# Patient Record
Sex: Male | Born: 1945 | Race: White | Hispanic: No | Marital: Married | Smoking: Former smoker
Health system: Southern US, Community
[De-identification: ages and names within clinical notes are randomized; demographics above are authoritative.]

## PROBLEM LIST (undated history)

## (undated) DIAGNOSIS — D539 Nutritional anemia, unspecified: Secondary | ICD-10-CM

## (undated) DIAGNOSIS — J690 Pneumonitis due to inhalation of food and vomit: Secondary | ICD-10-CM

## (undated) DIAGNOSIS — N289 Disorder of kidney and ureter, unspecified: Secondary | ICD-10-CM

## (undated) DIAGNOSIS — J9621 Acute and chronic respiratory failure with hypoxia: Secondary | ICD-10-CM

## (undated) DIAGNOSIS — S065X0S Traumatic subdural hemorrhage without loss of consciousness, sequela: Secondary | ICD-10-CM

## (undated) DIAGNOSIS — K219 Gastro-esophageal reflux disease without esophagitis: Secondary | ICD-10-CM

## (undated) DIAGNOSIS — I4891 Unspecified atrial fibrillation: Secondary | ICD-10-CM

## (undated) DIAGNOSIS — D649 Anemia, unspecified: Secondary | ICD-10-CM

## (undated) DIAGNOSIS — F0390 Unspecified dementia without behavioral disturbance: Secondary | ICD-10-CM

## (undated) DIAGNOSIS — J9690 Respiratory failure, unspecified, unspecified whether with hypoxia or hypercapnia: Secondary | ICD-10-CM

## (undated) DIAGNOSIS — I639 Cerebral infarction, unspecified: Secondary | ICD-10-CM

## (undated) DIAGNOSIS — F4310 Post-traumatic stress disorder, unspecified: Secondary | ICD-10-CM

## (undated) DIAGNOSIS — Z951 Presence of aortocoronary bypass graft: Secondary | ICD-10-CM

## (undated) DIAGNOSIS — N179 Acute kidney failure, unspecified: Secondary | ICD-10-CM

## (undated) DIAGNOSIS — I69354 Hemiplegia and hemiparesis following cerebral infarction affecting left non-dominant side: Secondary | ICD-10-CM

## (undated) DIAGNOSIS — F411 Generalized anxiety disorder: Secondary | ICD-10-CM

## (undated) DIAGNOSIS — I69398 Other sequelae of cerebral infarction: Secondary | ICD-10-CM

## (undated) DIAGNOSIS — G4700 Insomnia, unspecified: Secondary | ICD-10-CM

## (undated) DIAGNOSIS — H42 Glaucoma in diseases classified elsewhere: Secondary | ICD-10-CM

## (undated) DIAGNOSIS — H409 Unspecified glaucoma: Secondary | ICD-10-CM

## (undated) DIAGNOSIS — G5680 Other specified mononeuropathies of unspecified upper limb: Secondary | ICD-10-CM

## (undated) DIAGNOSIS — I2510 Atherosclerotic heart disease of native coronary artery without angina pectoris: Secondary | ICD-10-CM

## (undated) DIAGNOSIS — S98139A Complete traumatic amputation of one unspecified lesser toe, initial encounter: Secondary | ICD-10-CM

## (undated) DIAGNOSIS — N183 Chronic kidney disease, stage 3 unspecified: Secondary | ICD-10-CM

## (undated) DIAGNOSIS — G894 Chronic pain syndrome: Secondary | ICD-10-CM

## (undated) DIAGNOSIS — I10 Essential (primary) hypertension: Secondary | ICD-10-CM

## (undated) DIAGNOSIS — L89620 Pressure ulcer of left heel, unstageable: Secondary | ICD-10-CM

## (undated) DIAGNOSIS — Z9911 Dependence on respirator [ventilator] status: Secondary | ICD-10-CM

## (undated) DIAGNOSIS — Z89439 Acquired absence of unspecified foot: Secondary | ICD-10-CM

## (undated) DIAGNOSIS — I739 Peripheral vascular disease, unspecified: Secondary | ICD-10-CM

## (undated) DIAGNOSIS — R1312 Dysphagia, oropharyngeal phase: Secondary | ICD-10-CM

## (undated) DIAGNOSIS — G9340 Encephalopathy, unspecified: Secondary | ICD-10-CM

## (undated) DIAGNOSIS — E875 Hyperkalemia: Secondary | ICD-10-CM

## (undated) DIAGNOSIS — H9190 Unspecified hearing loss, unspecified ear: Secondary | ICD-10-CM

## (undated) DIAGNOSIS — E119 Type 2 diabetes mellitus without complications: Secondary | ICD-10-CM

## (undated) DIAGNOSIS — L89159 Pressure ulcer of sacral region, unspecified stage: Secondary | ICD-10-CM

## (undated) DIAGNOSIS — H543 Unqualified visual loss, both eyes: Secondary | ICD-10-CM

## (undated) DIAGNOSIS — E079 Disorder of thyroid, unspecified: Secondary | ICD-10-CM

## (undated) DIAGNOSIS — M6281 Muscle weakness (generalized): Secondary | ICD-10-CM

## (undated) DIAGNOSIS — S301XXA Contusion of abdominal wall, initial encounter: Secondary | ICD-10-CM

## (undated) DIAGNOSIS — D34 Benign neoplasm of thyroid gland: Secondary | ICD-10-CM

## (undated) DIAGNOSIS — R29898 Other symptoms and signs involving the musculoskeletal system: Secondary | ICD-10-CM

## (undated) HISTORY — PX: CORONARY ARTERY BYPASS GRAFT: SHX141

---

## 2005-11-18 DIAGNOSIS — S1201XA Stable burst fracture of first cervical vertebra, initial encounter for closed fracture: Secondary | ICD-10-CM

## 2005-11-18 HISTORY — DX: Stable burst fracture of first cervical vertebra, initial encounter for closed fracture: S12.01XA

## 2019-04-30 ENCOUNTER — Other Ambulatory Visit: Payer: Self-pay

## 2019-04-30 ENCOUNTER — Emergency Department: Payer: Medicare Other

## 2019-04-30 ENCOUNTER — Encounter: Payer: Self-pay | Admitting: Emergency Medicine

## 2019-04-30 ENCOUNTER — Emergency Department
Admission: EM | Admit: 2019-04-30 | Discharge: 2019-04-30 | Disposition: A | Discharge status: Other Acute Inpatient Hospital | Payer: Medicare Other | Attending: Student in an Organized Health Care Education/Training Program | Admitting: Student in an Organized Health Care Education/Training Program

## 2019-04-30 DIAGNOSIS — Z79899 Other long term (current) drug therapy: Secondary | ICD-10-CM | POA: Diagnosis not present

## 2019-04-30 DIAGNOSIS — Z7982 Long term (current) use of aspirin: Secondary | ICD-10-CM | POA: Diagnosis not present

## 2019-04-30 DIAGNOSIS — Z20822 Contact with and (suspected) exposure to covid-19: Secondary | ICD-10-CM | POA: Insufficient documentation

## 2019-04-30 DIAGNOSIS — Z794 Long term (current) use of insulin: Secondary | ICD-10-CM | POA: Insufficient documentation

## 2019-04-30 DIAGNOSIS — N3090 Cystitis, unspecified without hematuria: Secondary | ICD-10-CM | POA: Diagnosis not present

## 2019-04-30 DIAGNOSIS — R4182 Altered mental status, unspecified: Secondary | ICD-10-CM | POA: Diagnosis present

## 2019-04-30 DIAGNOSIS — Z8673 Personal history of transient ischemic attack (TIA), and cerebral infarction without residual deficits: Secondary | ICD-10-CM | POA: Diagnosis not present

## 2019-04-30 HISTORY — DX: Cerebral infarction, unspecified: I63.9

## 2019-04-30 LAB — CBC WITH DIFFERENTIAL/PLATELET
Abs Immature Granulocytes: 0.02 10*3/uL (ref 0.00–0.07)
Basophils Absolute: 0.1 10*3/uL (ref 0.0–0.1)
Basophils Relative: 1 %
Eosinophils Absolute: 0.3 10*3/uL (ref 0.0–0.5)
Eosinophils Relative: 3 %
HCT: 34.7 % — ABNORMAL LOW (ref 39.0–52.0)
Hemoglobin: 10.8 g/dL — ABNORMAL LOW (ref 13.0–17.0)
Immature Granulocytes: 0 %
Lymphocytes Relative: 9 %
Lymphs Abs: 0.9 10*3/uL (ref 0.7–4.0)
MCH: 31 pg (ref 26.0–34.0)
MCHC: 31.1 g/dL (ref 30.0–36.0)
MCV: 99.7 fL (ref 80.0–100.0)
Monocytes Absolute: 0.7 10*3/uL (ref 0.1–1.0)
Monocytes Relative: 6 %
Neutro Abs: 8.3 10*3/uL — ABNORMAL HIGH (ref 1.7–7.7)
Neutrophils Relative %: 81 %
Platelets: 193 10*3/uL (ref 150–400)
RBC: 3.48 MIL/uL — ABNORMAL LOW (ref 4.22–5.81)
RDW: 15.5 % (ref 11.5–15.5)
WBC: 10.3 10*3/uL (ref 4.0–10.5)
nRBC: 0 % (ref 0.0–0.2)

## 2019-04-30 LAB — URINALYSIS, COMPLETE (UACMP) WITH MICROSCOPIC
Bacteria, UA: NONE SEEN
Bilirubin Urine: NEGATIVE
Glucose, UA: NEGATIVE mg/dL
Hgb urine dipstick: NEGATIVE
Ketones, ur: NEGATIVE mg/dL
Nitrite: NEGATIVE
Protein, ur: NEGATIVE mg/dL
Specific Gravity, Urine: 1.015 (ref 1.005–1.030)
WBC, UA: >50 WBC/hpf — ABNORMAL HIGH (ref 0–5)
pH: 5 (ref 5.0–8.0)

## 2019-04-30 LAB — COMPREHENSIVE METABOLIC PANEL
ALT: 13 U/L (ref 0–44)
AST: 15 U/L (ref 15–41)
Albumin: 3.3 g/dL — ABNORMAL LOW (ref 3.5–5.0)
Alkaline Phosphatase: 73 U/L (ref 38–126)
Anion gap: 8 (ref 5–15)
BUN: 20 mg/dL (ref 8–23)
CO2: 20 mmol/L — ABNORMAL LOW (ref 22–32)
Calcium: 8.7 mg/dL — ABNORMAL LOW (ref 8.9–10.3)
Chloride: 114 mmol/L — ABNORMAL HIGH (ref 98–111)
Creatinine, Ser: 1.02 mg/dL (ref 0.61–1.24)
GFR calc Af Amer: >60 mL/min (ref >60)
GFR calc non Af Amer: >60 mL/min (ref >60)
Glucose, Bld: 92 mg/dL (ref 70–99)
Potassium: 3.7 mmol/L (ref 3.5–5.1)
Sodium: 142 mmol/L (ref 135–145)
Total Bilirubin: 0.7 mg/dL (ref 0.3–1.2)
Total Protein: 6.3 g/dL — ABNORMAL LOW (ref 6.5–8.1)

## 2019-04-30 LAB — POC SARS CORONAVIRUS 2 AG -  ED: SARS Coronavirus 2 Ag: NEGATIVE

## 2019-04-30 MED ORDER — CEPHALEXIN 500 MG PO CAPS: 500.0000 mg | ORAL_CAPSULE | Freq: Three times a day (TID) | ORAL | 0 refills | Status: AC

## 2019-04-30 MED ORDER — SODIUM CHLORIDE 0.9 % IV SOLN
1.0000 g | Freq: Once | INTRAVENOUS | Status: AC
  Administered 2019-04-30: 13:00:00 1 g via INTRAVENOUS
  Filled 2019-04-30: qty 10

## 2019-04-30 NOTE — ED Triage Notes (Signed)
Pt presents from [facility] with c/o altered mental status. According to facility, at baseline pt can feed self, facility reported pt was not able to feed self this am. Pt c/o bilateral arm weakness. Pt has hx of CVA.  stroke screen negative for ems. Pt alert and oriented x3. CBG 132.

## 2019-04-30 NOTE — Discharge Instructions (Signed)
Please take antibiotics as prescribed.  Return for any additional questions or concerns.

## 2019-04-30 NOTE — ED Notes (Signed)
Patient provided with urinal per request and wife was assisted with finding restroom. Patient denies needing assistance

## 2019-04-30 NOTE — ED Provider Notes (Signed)
[facility] Emergency Department Provider Note    First MD Initiated Contact with Patient 04/30/19 1036     (approximate)  I have reviewed the triage vital signs and the nursing notes.   HISTORY  Chief Complaint Altered Mental Status    HPI Albert Vance is a 74 y.o. male presents the ER for evaluation of altered mental status including confusion as well as generalized weakness reportedly having difficulty with raising both arms today and having trouble eating.  Patient with extensive stroke history.  Last seen normal was last night.  Patient states he does feel weak.  Fairly confused and difficult to get a good history from the patient himself.  Facility reports that he is typically fairly independent.  No report of any recent fevers.  Patient did have recent left leg surgery.  I do not see any documentation as to where this previously occurred.    Past Medical History:  Diagnosis Date  . Stroke [facility])    History reviewed. No pertinent family history. History reviewed. No pertinent surgical history. There are no problems to display for this patient.     Prior to Admission medications   Medication Sig Start Date End Date Taking? Authorizing Provider  acetaminophen (TYLENOL) 500 MG tablet Take 500 mg by mouth every 8 (eight) hours.   Yes [provider]  acetaZOLAMIDE (DIAMOX) 250 MG tablet Take 250 mg by mouth 2 (two) times daily.   Yes [provider]  amLODipine (NORVASC) 10 MG tablet Take 10 mg by mouth daily.   Yes [provider]  aspirin EC 81 MG tablet Take 81 mg by mouth daily.   Yes [provider]  atorvastatin (LIPITOR) 40 MG tablet Take 40 mg by mouth at bedtime.   Yes [provider]  baclofen (LIORESAL) 10 MG tablet Take 5 mg by mouth at bedtime.   Yes [provider]  bisacodyl (DULCOLAX) 10 MG suppository Place 10 mg rectally as needed for moderate constipation.   Yes [provider]  Brinzolamide-Brimonidine (SIMBRINZA) 1-0.2 % SUSP Place 1 drop into both eyes 3 (three) times daily.   Yes [provider]  carboxymethylcellulose 1 % ophthalmic solution Place 2 drops into the left eye 4 (four) times daily.   Yes [provider]  cholecalciferol (VITAMIN D3) 25 MCG (1000 UNIT) tablet Take 1,000 Units by mouth daily.   Yes [provider]  dextrose (GLUTOSE 15) 40 % GEL Take 1 Tube by mouth once as needed for low blood sugar.   Yes [provider]  dorzolamide-timolol (COSOPT) 22.3-6.8 MG/ML ophthalmic solution Place 1 drop into both eyes 2 (two) times daily.   Yes [provider]  insulin aspart (NOVOLOG) 100 UNIT/ML injection Inject 0-12 Units into the skin 4 (four) times daily -  before meals and at bedtime. Inject under the skin according to sliding scale:  <200: 0u 200-249: 2u 250-299: 4u 300-349: 6u 350-399: 8u 400-449: 10u 450-499: 12u >500: contact physician   Yes [provider]  insulin glargine (LANTUS) 100 UNIT/ML injection Inject 20 Units into the skin at bedtime.   Yes [provider]  latanoprost (XALATAN) 0.005 % ophthalmic solution Place 1 drop into both eyes at bedtime.   Yes [provider]  losartan (COZAAR) 100 MG tablet Take 100 mg by mouth daily.   Yes [provider]  Melatonin 3 MG TABS Take 6 mg by mouth at bedtime.   Yes [provider]  Netarsudil  Dimesylate (RHOPRESSA) 0.02 % SOLN Place 1 drop into both eyes every evening.   Yes [provider]  ondansetron (ZOFRAN) 4 MG tablet Take 4 mg by mouth every 4 (four) hours as needed for nausea or vomiting.   Yes [provider]  oxyCODONE (OXY IR/ROXICODONE) 5 MG immediate release tablet Take 5 mg by mouth every 8 (eight) hours.   Yes [provider]  oxyCODONE (OXY IR/ROXICODONE) 5 MG immediate release tablet Take 5 mg by mouth every 4 (four) hours as needed for severe  pain.   Yes [provider]  rivaroxaban (XARELTO) 20 MG TABS tablet Take 20 mg by mouth daily with supper.   Yes [provider]  sennosides-docusate sodium (SENOKOT-S) 8.6-50 MG tablet Take 2 tablets by mouth 2 (two) times daily with a meal.   Yes [provider]  tamsulosin (FLOMAX) 0.4 MG CAPS capsule Take 0.4 mg by mouth at bedtime.   Yes [provider]  cephALEXin (KEFLEX) 500 MG capsule Take 1 capsule (500 mg total) by mouth 3 (three) times daily for 7 days. 04/30/19 05/07/19  Merlyn Lot, MD    Allergies Codeine, Gabapentin, Lisinopril, and Metformin and related    Social History Social History   Tobacco Use  . Smoking status: Unknown If Ever Smoked  Substance Use Topics  . Alcohol use: Not on file  . Drug use: Not on file    Review of Systems Patient denies headaches, rhinorrhea, blurry vision, numbness, shortness of breath, chest pain, edema, cough, abdominal pain, nausea, vomiting, diarrhea, dysuria, fevers, rashes or hallucinations unless otherwise stated above in HPI. ____________________________________________   PHYSICAL EXAM:  VITAL SIGNS: Vitals:   04/30/19 1130 04/30/19 1300  BP: (!) 156/53 (!) 135/98  Pulse: (!) 37   Resp: 18 19  Temp:    SpO2: 100% (!) 85%    Constitutional: Alert but disoriented x2.  Eyes: Conjunctivae are normal.  Head: Atraumatic. Nose: No congestion/rhinnorhea. Mouth/Throat: Mucous membranes are moist.   Neck: No stridor. Painless ROM.  Cardiovascular: irregular rhythm. Grossly normal heart sounds.  Good peripheral circulation. Respiratory: Normal respiratory effort.  No retractions. Lungs CTAB. Gastrointestinal: Soft and nontender. No distention. No abdominal bruits. No CVA tenderness. Genitourinary:  Musculoskeletal: BLE edema,  lle surgical wound appears appropriate healing.  No joint effusions. Neurologic:  BUE weakness, bilateral dysmetria, no facial droop. Can't hold legs up  against gravity.  SILT Skin:  Skin is warm, dry and intact. No rash noted. Psychiatric: calm and cooperative. ____________________________________________   LABS (all labs ordered are listed, but only abnormal results are displayed)  Results for orders placed or performed during the hospital encounter of 04/30/19 (from the past 24 hour(s))  Urinalysis, Complete w Microscopic     Status: Abnormal   Collection Time: 04/30/19 10:43 AM  Result Value Ref Range   Color, Urine YELLOW (A) YELLOW   APPearance HAZY (A) CLEAR   Specific Gravity, Urine 1.015 1.005 - 1.030   pH 5.0 5.0 - 8.0   Glucose, UA NEGATIVE NEGATIVE mg/dL   Hgb urine dipstick NEGATIVE NEGATIVE   Bilirubin Urine NEGATIVE NEGATIVE   Ketones, ur NEGATIVE NEGATIVE mg/dL   Protein, ur NEGATIVE NEGATIVE mg/dL   Nitrite NEGATIVE NEGATIVE   Leukocytes,Ua MODERATE (A) NEGATIVE   RBC / HPF 0-5 0 - 5 RBC/hpf   WBC, UA >50 (H) 0 - 5 WBC/hpf   Bacteria, UA NONE SEEN NONE SEEN   Squamous Epithelial / LPF 0-5 0 - 5   WBC  Clumps PRESENT   CBC with Differential/Platelet     Status: Abnormal   Collection Time: 04/30/19 10:47 AM  Result Value Ref Range   WBC 10.3 4.0 - 10.5 K/uL   RBC 3.48 (L) 4.22 - 5.81 MIL/uL   Hemoglobin 10.8 (L) 13.0 - 17.0 g/dL   HCT 34.7 (L) 39.0 - 52.0 %   MCV 99.7 80.0 - 100.0 fL   MCH 31.0 26.0 - 34.0 pg   MCHC 31.1 30.0 - 36.0 g/dL   RDW 15.5 11.5 - 15.5 %   Platelets 193 150 - 400 K/uL   nRBC 0.0 0.0 - 0.2 %   Neutrophils Relative % 81 %   Neutro Abs 8.3 (H) 1.7 - 7.7 K/uL   Lymphocytes Relative 9 %   Lymphs Abs 0.9 0.7 - 4.0 K/uL   Monocytes Relative 6 %   Monocytes Absolute 0.7 0.1 - 1.0 K/uL   Eosinophils Relative 3 %   Eosinophils Absolute 0.3 0.0 - 0.5 K/uL   Basophils Relative 1 %   Basophils Absolute 0.1 0.0 - 0.1 K/uL   Immature Granulocytes 0 %   Abs Immature Granulocytes 0.02 0.00 - 0.07 K/uL  Comprehensive metabolic panel     Status: Abnormal   Collection Time: 04/30/19 10:47 AM   Result Value Ref Range   Sodium 142 135 - 145 mmol/L   Potassium 3.7 3.5 - 5.1 mmol/L   Chloride 114 (H) 98 - 111 mmol/L   CO2 20 (L) 22 - 32 mmol/L   Glucose, Bld 92 70 - 99 mg/dL   BUN 20 8 - 23 mg/dL   Creatinine, Ser 1.02 0.61 - 1.24 mg/dL   Calcium 8.7 (L) 8.9 - 10.3 mg/dL   Total Protein 6.3 (L) 6.5 - 8.1 g/dL   Albumin 3.3 (L) 3.5 - 5.0 g/dL   AST 15 15 - 41 U/L   ALT 13 0 - 44 U/L   Alkaline Phosphatase 73 38 - 126 U/L   Total Bilirubin 0.7 0.3 - 1.2 mg/dL   GFR calc non Af Amer >60 >60 mL/min   GFR calc Af Amer >60 >60 mL/min   Anion gap 8 5 - 15  POC SARS Coronavirus 2 Ag-ED - Nasal Swab (BD Veritor Kit)     Status: None   Collection Time: 04/30/19  1:41 PM  Result Value Ref Range   SARS Coronavirus 2 Ag Negative Negative   ____________________________________________  EKG My review and personal interpretation at Time:   10:41 Indication: weakness  Rate: 50  Rhythm: afib Axis: normal Other: poor w wave progression, no stemi ____________________________________________  RADIOLOGY  I personally reviewed all radiographic images ordered to evaluate for the above acute complaints and reviewed radiology reports and findings.  These findings were personally discussed with the patient.  Please see medical record for radiology report.  ____________________________________________   PROCEDURES  Procedure(s) performed:  Procedures    Critical Care performed: no ____________________________________________   INITIAL IMPRESSION / ASSESSMENT AND PLAN / ED COURSE  Pertinent labs & imaging results that were available during my care of the patient were reviewed by me and considered in my medical decision making (see chart for details).   DDX: Dehydration, sepsis, pna, uti, hypoglycemia, cva, drug effect, withdrawal   Albert Vance is a 74 y.o. who presents to the ED with symptoms as described above.  Patient chronically ill-appearing with the above complaints.   Fairly broad differential at this point.  Will order imaging as well as blood work.  The patient will be placed on continuous pulse oximetry and telemetry for monitoring.  Laboratory evaluation will be sent to evaluate for the above complaints.     Clinical Course as of Apr 29 1545  Fri Apr 30, 2019  1236 Blood work without significant leukocytosis.  Remains afebrile.  Urine does appear dirty will give dose of Rocephin order culture.  Given his altered mental status he is outside of the window for code stroke border order MRI given his extensive history of CVA in the past.  I feel like he needs to be admitted to the hospital.  Was recently at the [place] what appears to be probable vascular surgery of the left lower extremity with appropriately healing wound.  I cannot find documentation of what procedure the course of that recent hospitalization was.  Will reach out to the [place] for admission.   [PR]  1442 Patient was declined by the [place]. He clinically has improved in the ER.  MRI imaging is reassuring.  Patient is tolerating PO.  Seems to be at baseline.  Doubt TIA.  No signs of sepsis.   [PR]    Clinical Course User Index [PR] Merlyn Lot, MD    The patient was evaluated in Emergency Department today for the symptoms described in the history of present illness. He/she was evaluated in the context of the global COVID-19 pandemic, which necessitated consideration that the patient might be at risk for infection with the SARS-CoV-2 virus that causes COVID-19. Institutional protocols and algorithms that pertain to the evaluation of patients at risk for COVID-19 are in a state of rapid change based on information released by regulatory bodies including the CDC and federal and state organizations. These policies and algorithms were followed during the patient's care in the ED.  As part of my medical decision making, I reviewed the following data within the [facility] notes reviewed  and incorporated, Labs reviewed, notes from prior ED visits and [place] Controlled Substance Database   ____________________________________________   FINAL CLINICAL IMPRESSION(S) / ED DIAGNOSES  Final diagnoses:  Altered mental status, unspecified altered mental status type  Cystitis      NEW MEDICATIONS STARTED DURING THIS VISIT:  New Prescriptions   CEPHALEXIN (KEFLEX) 500 MG CAPSULE    Take 1 capsule (500 mg total) by mouth 3 (three) times daily for 7 days.     Note:  This document was prepared using Dragon voice recognition software and may include unintentional dictation errors.    Merlyn Lot, MD 04/30/19 1547

## 2019-04-30 NOTE — ED Notes (Signed)
Pt given graham crackers and water to drink.  

## 2019-05-01 LAB — URINE CULTURE: Culture: 20000 — AB

## 2020-12-19 IMAGING — MR MR HEAD W/O CM
12 of 15 series · 38 of 48 positions shown · non-contrast
Comparison: None.

CLINICAL DATA: Altered mental status

EXAM:
MRI HEAD WITHOUT CONTRAST
MRI CERVICAL SPINE WITHOUT CONTRAST
TECHNIQUE: Multiplanar, multiecho pulse sequences of the brain and surrounding
structures, and cervical spine, to include the craniocervical
junction and cervicothoracic junction, were obtained without
intravenous contrast.

[Series 5: ax dwi_tracew · axial · 3.0mm · 0.60mm/px · z∈[-128,+25]mm · 5 of 48 slices shown]
[im 1/48]
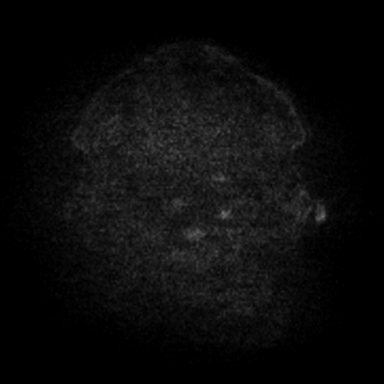
[im 12/48]
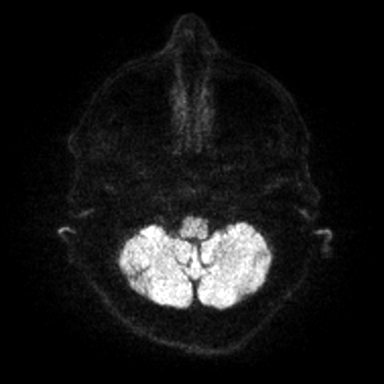
[im 24/48]
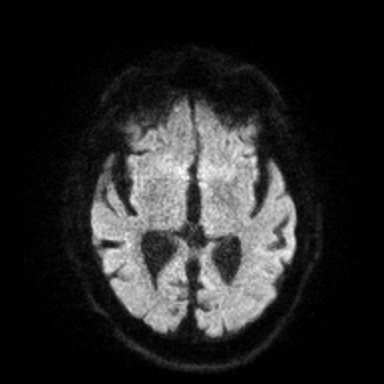
[im 36/48]
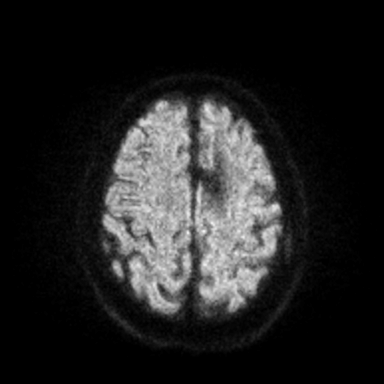
[im 48/48]
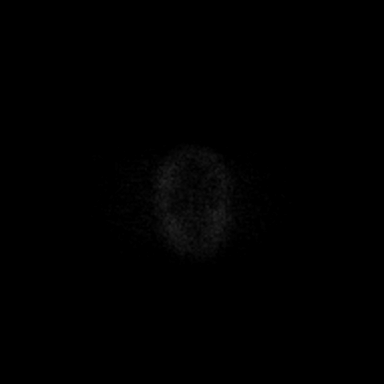

[Series 6: ax dwi_adc · axial · 3.0mm · 0.60mm/px · z∈[-128,+25]mm · 5 of 48 slices shown]
[im 1/48]
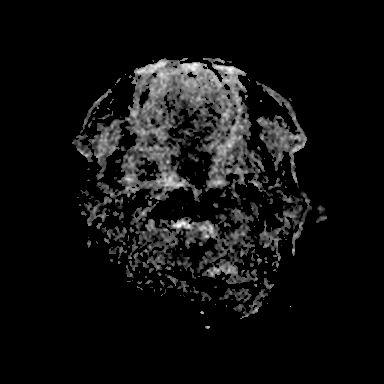
[im 12/48]
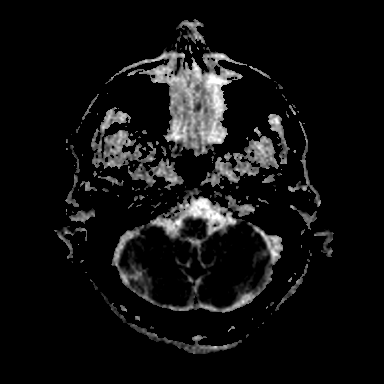
[im 24/48]
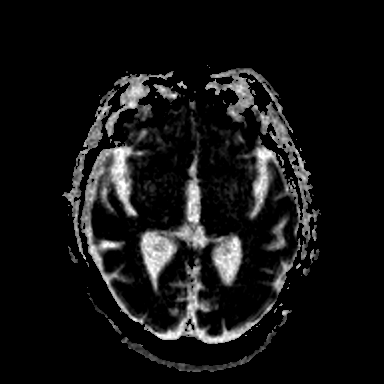
[im 36/48]
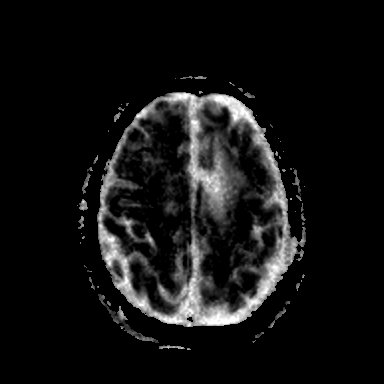
[im 48/48]
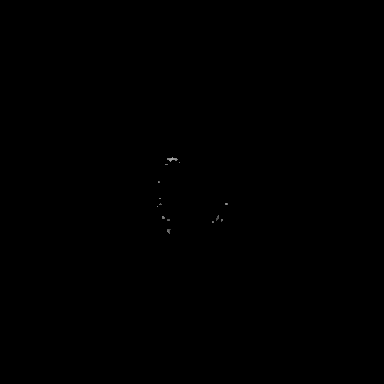

[Series 7: cor dwi_tracew · coronal · 5.0mm · 0.60mm/px · 4 of 38 slices shown]
[im 1/38]
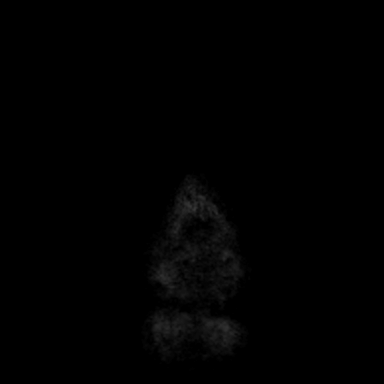
[im 13/38]
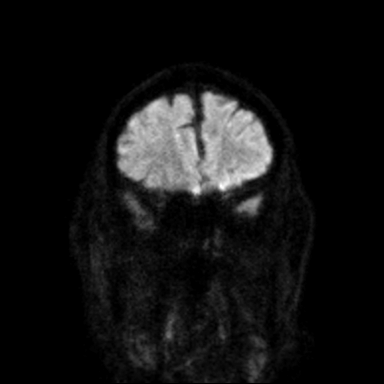
[im 25/38]
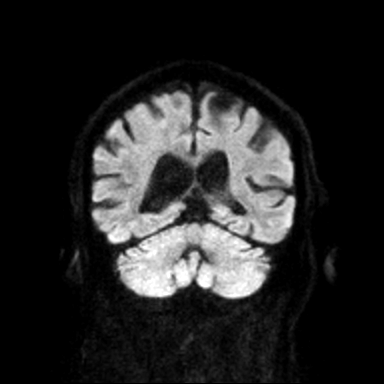
[im 38/38]
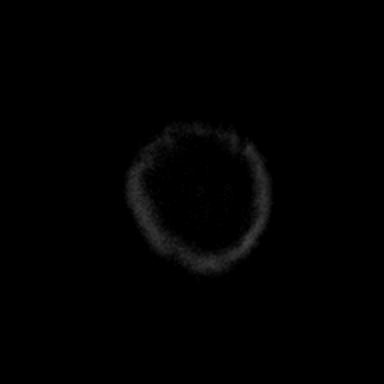

[Series 9: T1 · sagittal · 5.0mm · 0.94mm/px · 3 of 25 slices shown (1 of 2)]
[im 1/25]
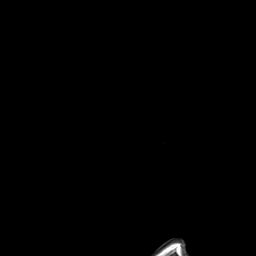
[im 13/25]
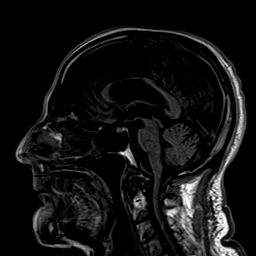
[im 25/25]
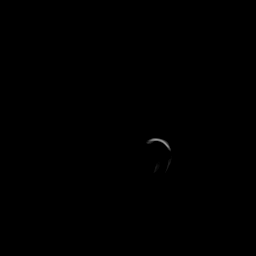

[Series 10: T2 · axial · 5.0mm · 0.45mm/px · z∈[-125,+30]mm · 3 of 27 slices shown (1 of 4)]
[im 1/27]
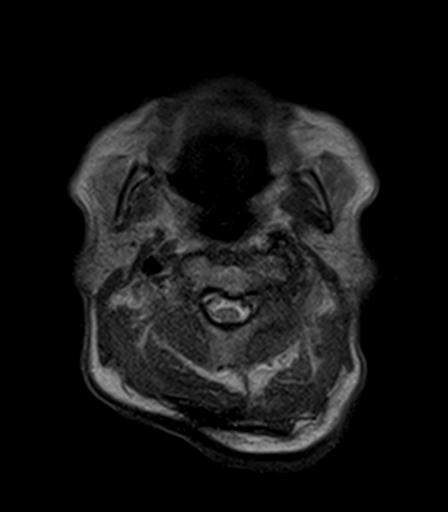
[im 14/27]
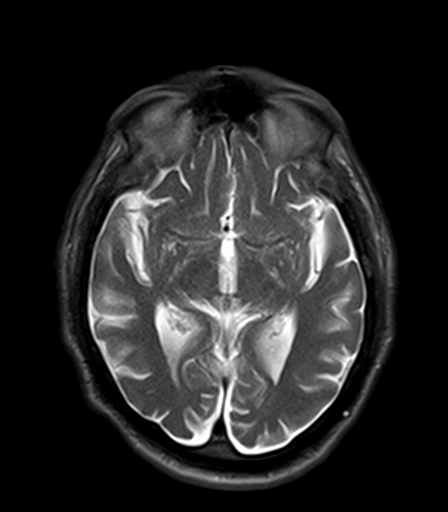
[im 27/27]
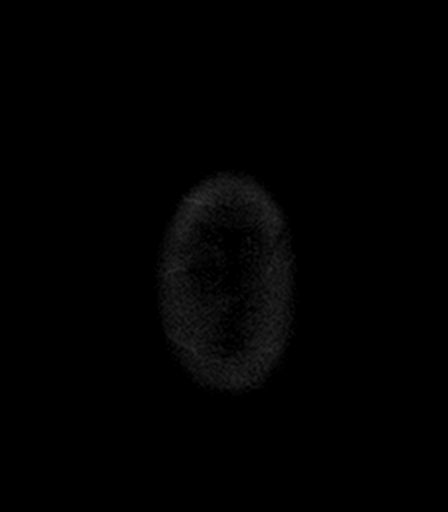

[Series 12: FLAIR · axial · 5.0mm · 1.20mm/px · z∈[-125,+29]mm · 3 of 27 slices shown (1 of 2)]
[im 1/27]
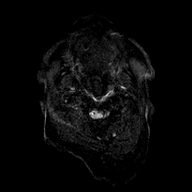
[im 14/27]
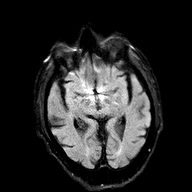
[im 27/27]
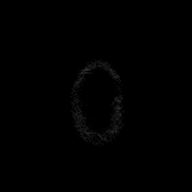

[Series 13: T1 · axial · 5.0mm · 0.90mm/px · z∈[-125,+30]mm · 3 of 27 slices shown (2 of 2)]
[im 1/27]
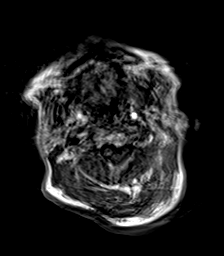
[im 14/27]
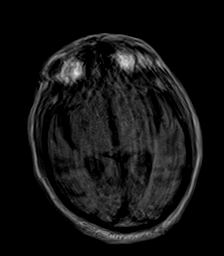
[im 27/27]
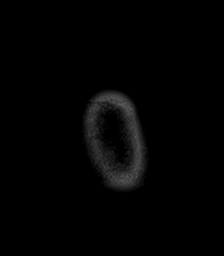

[Series 14: T2 · coronal · 5.0mm · 0.45mm/px · 3 of 31 slices shown (2 of 4)]
[im 1/31]
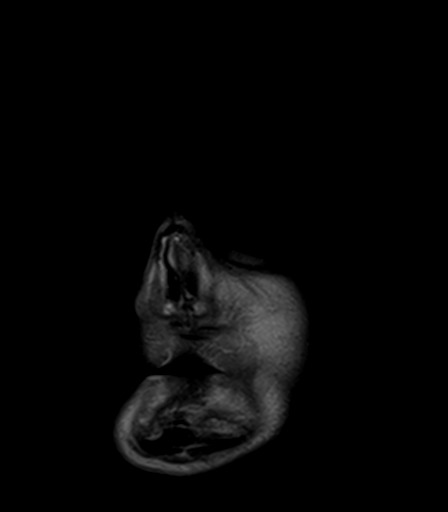
[im 16/31]
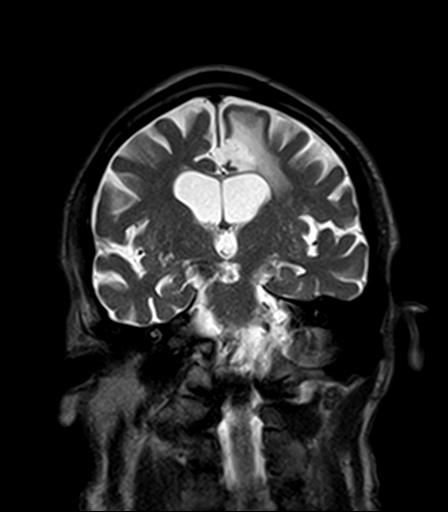
[im 31/31]
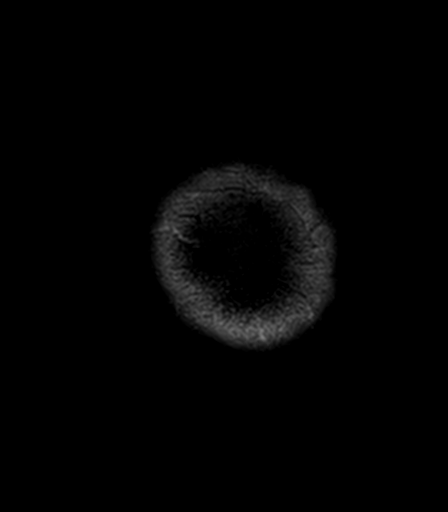

[Series 19: T2 · sagittal · 3.0mm · 0.62mm/px · 2 of 15 slices shown (3 of 4)]
[im 1/15]
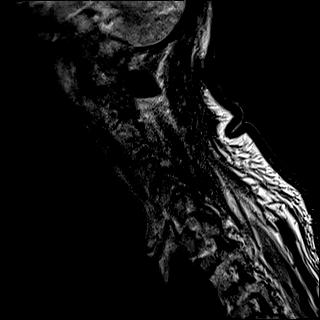
[im 15/15]
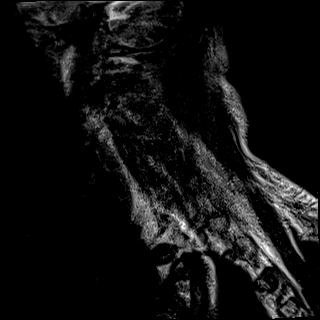

[Series 20: FLAIR · sagittal · 3.0mm · 0.78mm/px · 2 of 15 slices shown (2 of 2)]
[im 1/15]
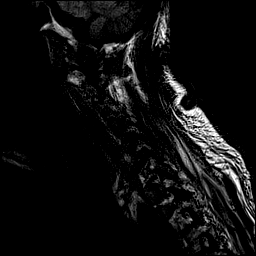
[im 15/15]
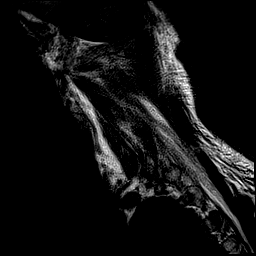

[Series 21: STIR · sagittal · 3.0mm · 0.62mm/px · 2 of 15 slices shown]
[im 1/15]
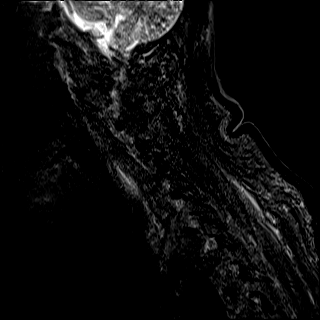
[im 15/15]
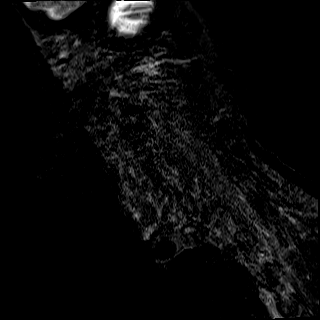

[Series 22: T2 · axial · 3.0mm · 0.70mm/px · z∈[-239,-143]mm · 3 of 31 slices shown (4 of 4)]
[im 1/31]
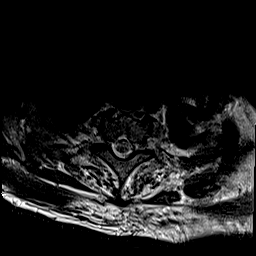
[im 16/31]
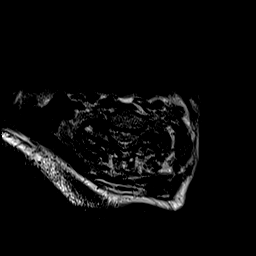
[im 31/31]
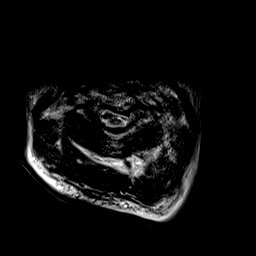

[38 of 48 positions shown; findings below may reference images not displayed]

FINDINGS: MRI HEAD

Brain: There is no acute infarction or intracranial hemorrhage.

There is a chronic infarct of the parasagittal left frontal lobe
involving the ACA territory with some extension into the corpus
callosum. There are chronic right cerebellar infarcts with
associated chronic blood products. Additional patchy T2
hyperintensity in the supratentorial white matter is nonspecific but
may reflect mild chronic microvascular ischemic changes.

There is no intracranial mass, mass effect, or edema. There is no
hydrocephalus or extra-axial fluid collection.

Vascular: Major vessel flow voids at the skull base are preserved.

Skull and upper cervical spine: Normal marrow signal is preserved.

Sinuses/Orbits: Minor mucosal thickening. Bilateral lens
replacements.

Other: Sella is unremarkable. Mastoid air cells are clear.

MRI CERVICAL SPINE FINDINGS

Significantly motion degraded.

Alignment: No significant listhesis.

Vertebrae: Vertebral body heights appear maintained no definite
marrow edema.

Cord: No definite abnormal signal within the above limitation.

Posterior Fossa, vertebral arteries, paraspinal tissues: Mild
prevertebral edema. Otherwise unremarkable.

Disc levels: Multilevel degenerative changes are present including
disc bulges, endplate osteophytes, and facet and uncovertebral
hypertrophy. There is no high-grade canal stenosis. Multilevel
foraminal stenosis is present, likely moderate at C4-C5, C5-C6, and
C6-C7.
IMPRESSION: No acute infarction, hemorrhage, or mass. Chronic left frontal and
right cerebellar infarcts. Mild chronic microvascular ischemic
changes.

Significantly motion degraded cervical spine imaging. No high-grade
canal stenosis. Foraminal stenosis is poorly evaluated but at
probably moderate from C4-C5 through C6-C7.

## 2022-03-11 DIAGNOSIS — Z9889 Other specified postprocedural states: Secondary | ICD-10-CM

## 2022-03-11 HISTORY — DX: Other specified postprocedural states: Z98.890

## 2022-12-10 DIAGNOSIS — S065X0S Traumatic subdural hemorrhage without loss of consciousness, sequela: Secondary | ICD-10-CM

## 2022-12-10 DIAGNOSIS — R1312 Dysphagia, oropharyngeal phase: Secondary | ICD-10-CM

## 2022-12-10 DIAGNOSIS — Z9911 Dependence on respirator [ventilator] status: Secondary | ICD-10-CM

## 2022-12-10 HISTORY — PX: CRANIOTOMY: SHX93

## 2022-12-10 HISTORY — DX: Traumatic subdural hemorrhage without loss of consciousness, sequela: S06.5X0S

## 2022-12-10 HISTORY — DX: Dependence on respirator (ventilator) status: Z99.11

## 2022-12-10 HISTORY — DX: Dysphagia, oropharyngeal phase: R13.12

## 2022-12-31 DIAGNOSIS — S065XAA Traumatic subdural hemorrhage with loss of consciousness status unknown, initial encounter: Secondary | ICD-10-CM

## 2022-12-31 HISTORY — DX: Traumatic subdural hemorrhage with loss of consciousness status unknown, initial encounter: S06.5XAA

## 2023-01-10 HISTORY — PX: TRACHEOSTOMY: SUR1362

## 2023-01-27 DIAGNOSIS — I4891 Unspecified atrial fibrillation: Secondary | ICD-10-CM | POA: Diagnosis not present

## 2023-02-01 DIAGNOSIS — N183 Chronic kidney disease, stage 3 unspecified: Secondary | ICD-10-CM | POA: Diagnosis not present

## 2023-02-01 DIAGNOSIS — J189 Pneumonia, unspecified organism: Secondary | ICD-10-CM | POA: Diagnosis not present

## 2023-02-01 DIAGNOSIS — S065X0A Traumatic subdural hemorrhage without loss of consciousness, initial encounter: Secondary | ICD-10-CM | POA: Diagnosis not present

## 2023-02-01 DIAGNOSIS — R1310 Dysphagia, unspecified: Secondary | ICD-10-CM | POA: Diagnosis not present

## 2023-02-02 DIAGNOSIS — N183 Chronic kidney disease, stage 3 unspecified: Secondary | ICD-10-CM | POA: Diagnosis not present

## 2023-02-02 DIAGNOSIS — J189 Pneumonia, unspecified organism: Secondary | ICD-10-CM | POA: Diagnosis not present

## 2023-02-02 DIAGNOSIS — R1310 Dysphagia, unspecified: Secondary | ICD-10-CM | POA: Diagnosis not present

## 2023-02-02 DIAGNOSIS — S065X0A Traumatic subdural hemorrhage without loss of consciousness, initial encounter: Secondary | ICD-10-CM | POA: Diagnosis not present

## 2023-02-03 DIAGNOSIS — J9621 Acute and chronic respiratory failure with hypoxia: Secondary | ICD-10-CM | POA: Diagnosis not present

## 2023-02-03 DIAGNOSIS — I482 Chronic atrial fibrillation, unspecified: Secondary | ICD-10-CM | POA: Diagnosis not present

## 2023-02-03 DIAGNOSIS — J189 Pneumonia, unspecified organism: Secondary | ICD-10-CM | POA: Diagnosis not present

## 2023-02-03 DIAGNOSIS — Z930 Tracheostomy status: Secondary | ICD-10-CM | POA: Diagnosis not present

## 2023-02-04 DIAGNOSIS — I482 Chronic atrial fibrillation, unspecified: Secondary | ICD-10-CM | POA: Diagnosis not present

## 2023-02-04 DIAGNOSIS — J9621 Acute and chronic respiratory failure with hypoxia: Secondary | ICD-10-CM | POA: Diagnosis not present

## 2023-02-04 DIAGNOSIS — Z930 Tracheostomy status: Secondary | ICD-10-CM | POA: Diagnosis not present

## 2023-02-04 DIAGNOSIS — J189 Pneumonia, unspecified organism: Secondary | ICD-10-CM | POA: Diagnosis not present

## 2023-02-05 DIAGNOSIS — J189 Pneumonia, unspecified organism: Secondary | ICD-10-CM | POA: Diagnosis not present

## 2023-02-05 DIAGNOSIS — I482 Chronic atrial fibrillation, unspecified: Secondary | ICD-10-CM | POA: Diagnosis not present

## 2023-02-05 DIAGNOSIS — J9621 Acute and chronic respiratory failure with hypoxia: Secondary | ICD-10-CM | POA: Diagnosis not present

## 2023-02-05 DIAGNOSIS — Z930 Tracheostomy status: Secondary | ICD-10-CM | POA: Diagnosis not present

## 2023-02-06 DIAGNOSIS — J9621 Acute and chronic respiratory failure with hypoxia: Secondary | ICD-10-CM | POA: Diagnosis not present

## 2023-02-06 DIAGNOSIS — I482 Chronic atrial fibrillation, unspecified: Secondary | ICD-10-CM | POA: Diagnosis not present

## 2023-02-06 DIAGNOSIS — Z930 Tracheostomy status: Secondary | ICD-10-CM | POA: Diagnosis not present

## 2023-02-06 DIAGNOSIS — J189 Pneumonia, unspecified organism: Secondary | ICD-10-CM | POA: Diagnosis not present

## 2023-02-07 DIAGNOSIS — Z930 Tracheostomy status: Secondary | ICD-10-CM | POA: Diagnosis not present

## 2023-02-07 DIAGNOSIS — J9621 Acute and chronic respiratory failure with hypoxia: Secondary | ICD-10-CM | POA: Diagnosis not present

## 2023-02-07 DIAGNOSIS — I482 Chronic atrial fibrillation, unspecified: Secondary | ICD-10-CM | POA: Diagnosis not present

## 2023-02-07 DIAGNOSIS — J189 Pneumonia, unspecified organism: Secondary | ICD-10-CM | POA: Diagnosis not present

## 2023-02-08 DIAGNOSIS — Z930 Tracheostomy status: Secondary | ICD-10-CM | POA: Diagnosis not present

## 2023-02-08 DIAGNOSIS — J189 Pneumonia, unspecified organism: Secondary | ICD-10-CM | POA: Diagnosis not present

## 2023-02-08 DIAGNOSIS — J9621 Acute and chronic respiratory failure with hypoxia: Secondary | ICD-10-CM | POA: Diagnosis not present

## 2023-02-08 DIAGNOSIS — I482 Chronic atrial fibrillation, unspecified: Secondary | ICD-10-CM | POA: Diagnosis not present

## 2023-02-09 DIAGNOSIS — J9621 Acute and chronic respiratory failure with hypoxia: Secondary | ICD-10-CM | POA: Diagnosis not present

## 2023-02-09 DIAGNOSIS — J189 Pneumonia, unspecified organism: Secondary | ICD-10-CM | POA: Diagnosis not present

## 2023-02-09 DIAGNOSIS — Z930 Tracheostomy status: Secondary | ICD-10-CM | POA: Diagnosis not present

## 2023-02-09 DIAGNOSIS — I482 Chronic atrial fibrillation, unspecified: Secondary | ICD-10-CM | POA: Diagnosis not present

## 2023-02-17 DIAGNOSIS — I4891 Unspecified atrial fibrillation: Secondary | ICD-10-CM | POA: Diagnosis not present

## 2023-03-03 DIAGNOSIS — J189 Pneumonia, unspecified organism: Secondary | ICD-10-CM | POA: Diagnosis not present

## 2023-03-03 DIAGNOSIS — Z930 Tracheostomy status: Secondary | ICD-10-CM | POA: Diagnosis not present

## 2023-03-03 DIAGNOSIS — I482 Chronic atrial fibrillation, unspecified: Secondary | ICD-10-CM | POA: Diagnosis not present

## 2023-03-03 DIAGNOSIS — J9621 Acute and chronic respiratory failure with hypoxia: Secondary | ICD-10-CM | POA: Diagnosis not present

## 2023-03-04 DIAGNOSIS — J9621 Acute and chronic respiratory failure with hypoxia: Secondary | ICD-10-CM | POA: Diagnosis not present

## 2023-03-04 DIAGNOSIS — J189 Pneumonia, unspecified organism: Secondary | ICD-10-CM | POA: Diagnosis not present

## 2023-03-04 DIAGNOSIS — I482 Chronic atrial fibrillation, unspecified: Secondary | ICD-10-CM | POA: Diagnosis not present

## 2023-03-04 DIAGNOSIS — Z930 Tracheostomy status: Secondary | ICD-10-CM | POA: Diagnosis not present

## 2023-03-05 DIAGNOSIS — J189 Pneumonia, unspecified organism: Secondary | ICD-10-CM | POA: Diagnosis not present

## 2023-03-05 DIAGNOSIS — I10 Essential (primary) hypertension: Secondary | ICD-10-CM | POA: Diagnosis not present

## 2023-03-05 DIAGNOSIS — I4891 Unspecified atrial fibrillation: Secondary | ICD-10-CM | POA: Diagnosis not present

## 2023-03-05 DIAGNOSIS — J9621 Acute and chronic respiratory failure with hypoxia: Secondary | ICD-10-CM | POA: Diagnosis not present

## 2023-03-05 DIAGNOSIS — I482 Chronic atrial fibrillation, unspecified: Secondary | ICD-10-CM | POA: Diagnosis not present

## 2023-03-05 DIAGNOSIS — S065X0S Traumatic subdural hemorrhage without loss of consciousness, sequela: Secondary | ICD-10-CM | POA: Diagnosis not present

## 2023-03-05 DIAGNOSIS — Z930 Tracheostomy status: Secondary | ICD-10-CM | POA: Diagnosis not present

## 2023-03-06 DIAGNOSIS — J9621 Acute and chronic respiratory failure with hypoxia: Secondary | ICD-10-CM | POA: Diagnosis not present

## 2023-03-06 DIAGNOSIS — I482 Chronic atrial fibrillation, unspecified: Secondary | ICD-10-CM | POA: Diagnosis not present

## 2023-03-06 DIAGNOSIS — J189 Pneumonia, unspecified organism: Secondary | ICD-10-CM | POA: Diagnosis not present

## 2023-03-06 DIAGNOSIS — Z930 Tracheostomy status: Secondary | ICD-10-CM | POA: Diagnosis not present

## 2023-03-07 DIAGNOSIS — J189 Pneumonia, unspecified organism: Secondary | ICD-10-CM | POA: Diagnosis not present

## 2023-03-07 DIAGNOSIS — I482 Chronic atrial fibrillation, unspecified: Secondary | ICD-10-CM | POA: Diagnosis not present

## 2023-03-07 DIAGNOSIS — Z930 Tracheostomy status: Secondary | ICD-10-CM | POA: Diagnosis not present

## 2023-03-07 DIAGNOSIS — J9621 Acute and chronic respiratory failure with hypoxia: Secondary | ICD-10-CM | POA: Diagnosis not present

## 2023-03-08 DIAGNOSIS — I10 Essential (primary) hypertension: Secondary | ICD-10-CM | POA: Diagnosis not present

## 2023-03-08 DIAGNOSIS — S065X0S Traumatic subdural hemorrhage without loss of consciousness, sequela: Secondary | ICD-10-CM | POA: Diagnosis not present

## 2023-03-08 DIAGNOSIS — J189 Pneumonia, unspecified organism: Secondary | ICD-10-CM | POA: Diagnosis not present

## 2023-03-08 DIAGNOSIS — I482 Chronic atrial fibrillation, unspecified: Secondary | ICD-10-CM | POA: Diagnosis not present

## 2023-03-08 DIAGNOSIS — J9621 Acute and chronic respiratory failure with hypoxia: Secondary | ICD-10-CM | POA: Diagnosis not present

## 2023-03-08 DIAGNOSIS — Z930 Tracheostomy status: Secondary | ICD-10-CM | POA: Diagnosis not present

## 2023-03-08 DIAGNOSIS — I4891 Unspecified atrial fibrillation: Secondary | ICD-10-CM | POA: Diagnosis not present

## 2023-03-09 DIAGNOSIS — J189 Pneumonia, unspecified organism: Secondary | ICD-10-CM | POA: Diagnosis not present

## 2023-03-09 DIAGNOSIS — Z930 Tracheostomy status: Secondary | ICD-10-CM | POA: Diagnosis not present

## 2023-03-09 DIAGNOSIS — I10 Essential (primary) hypertension: Secondary | ICD-10-CM | POA: Diagnosis not present

## 2023-03-09 DIAGNOSIS — S065X0S Traumatic subdural hemorrhage without loss of consciousness, sequela: Secondary | ICD-10-CM | POA: Diagnosis not present

## 2023-03-09 DIAGNOSIS — I482 Chronic atrial fibrillation, unspecified: Secondary | ICD-10-CM | POA: Diagnosis not present

## 2023-03-09 DIAGNOSIS — J9621 Acute and chronic respiratory failure with hypoxia: Secondary | ICD-10-CM | POA: Diagnosis not present

## 2023-03-09 DIAGNOSIS — I4891 Unspecified atrial fibrillation: Secondary | ICD-10-CM | POA: Diagnosis not present

## 2023-03-19 DIAGNOSIS — J9621 Acute and chronic respiratory failure with hypoxia: Secondary | ICD-10-CM | POA: Diagnosis not present

## 2023-03-19 DIAGNOSIS — I482 Chronic atrial fibrillation, unspecified: Secondary | ICD-10-CM | POA: Diagnosis not present

## 2023-03-19 DIAGNOSIS — Z930 Tracheostomy status: Secondary | ICD-10-CM | POA: Diagnosis not present

## 2023-03-19 DIAGNOSIS — J189 Pneumonia, unspecified organism: Secondary | ICD-10-CM | POA: Diagnosis not present

## 2023-03-20 DIAGNOSIS — Z930 Tracheostomy status: Secondary | ICD-10-CM | POA: Diagnosis not present

## 2023-03-20 DIAGNOSIS — I482 Chronic atrial fibrillation, unspecified: Secondary | ICD-10-CM | POA: Diagnosis not present

## 2023-03-20 DIAGNOSIS — J9621 Acute and chronic respiratory failure with hypoxia: Secondary | ICD-10-CM | POA: Diagnosis not present

## 2023-03-20 DIAGNOSIS — J189 Pneumonia, unspecified organism: Secondary | ICD-10-CM | POA: Diagnosis not present

## 2023-03-21 DIAGNOSIS — Z930 Tracheostomy status: Secondary | ICD-10-CM | POA: Diagnosis not present

## 2023-03-21 DIAGNOSIS — I482 Chronic atrial fibrillation, unspecified: Secondary | ICD-10-CM | POA: Diagnosis not present

## 2023-03-21 DIAGNOSIS — J9621 Acute and chronic respiratory failure with hypoxia: Secondary | ICD-10-CM | POA: Diagnosis not present

## 2023-03-21 DIAGNOSIS — J189 Pneumonia, unspecified organism: Secondary | ICD-10-CM | POA: Diagnosis not present

## 2023-03-22 DIAGNOSIS — I4891 Unspecified atrial fibrillation: Secondary | ICD-10-CM | POA: Diagnosis not present

## 2023-03-22 DIAGNOSIS — I482 Chronic atrial fibrillation, unspecified: Secondary | ICD-10-CM | POA: Diagnosis not present

## 2023-03-22 DIAGNOSIS — Z930 Tracheostomy status: Secondary | ICD-10-CM | POA: Diagnosis not present

## 2023-03-22 DIAGNOSIS — I10 Essential (primary) hypertension: Secondary | ICD-10-CM | POA: Diagnosis not present

## 2023-03-22 DIAGNOSIS — J9621 Acute and chronic respiratory failure with hypoxia: Secondary | ICD-10-CM | POA: Diagnosis not present

## 2023-03-22 DIAGNOSIS — J189 Pneumonia, unspecified organism: Secondary | ICD-10-CM | POA: Diagnosis not present

## 2023-03-22 DIAGNOSIS — S065X0S Traumatic subdural hemorrhage without loss of consciousness, sequela: Secondary | ICD-10-CM | POA: Diagnosis not present

## 2023-03-23 DIAGNOSIS — J9621 Acute and chronic respiratory failure with hypoxia: Secondary | ICD-10-CM | POA: Diagnosis not present

## 2023-03-23 DIAGNOSIS — Z930 Tracheostomy status: Secondary | ICD-10-CM | POA: Diagnosis not present

## 2023-03-23 DIAGNOSIS — J189 Pneumonia, unspecified organism: Secondary | ICD-10-CM | POA: Diagnosis not present

## 2023-03-23 DIAGNOSIS — S065X0S Traumatic subdural hemorrhage without loss of consciousness, sequela: Secondary | ICD-10-CM | POA: Diagnosis not present

## 2023-03-23 DIAGNOSIS — I4891 Unspecified atrial fibrillation: Secondary | ICD-10-CM | POA: Diagnosis not present

## 2023-03-23 DIAGNOSIS — I10 Essential (primary) hypertension: Secondary | ICD-10-CM | POA: Diagnosis not present

## 2023-03-23 DIAGNOSIS — I482 Chronic atrial fibrillation, unspecified: Secondary | ICD-10-CM | POA: Diagnosis not present

## 2023-04-28 DIAGNOSIS — J90 Pleural effusion, not elsewhere classified: Secondary | ICD-10-CM | POA: Diagnosis not present

## 2023-04-28 DIAGNOSIS — I2510 Atherosclerotic heart disease of native coronary artery without angina pectoris: Secondary | ICD-10-CM | POA: Diagnosis not present

## 2023-04-28 DIAGNOSIS — I4891 Unspecified atrial fibrillation: Secondary | ICD-10-CM | POA: Diagnosis not present

## 2023-04-28 DIAGNOSIS — R1312 Dysphagia, oropharyngeal phase: Secondary | ICD-10-CM

## 2023-04-28 DIAGNOSIS — J9621 Acute and chronic respiratory failure with hypoxia: Secondary | ICD-10-CM | POA: Diagnosis not present

## 2023-05-01 DIAGNOSIS — I2510 Atherosclerotic heart disease of native coronary artery without angina pectoris: Secondary | ICD-10-CM | POA: Diagnosis not present

## 2023-05-01 DIAGNOSIS — I4891 Unspecified atrial fibrillation: Secondary | ICD-10-CM | POA: Diagnosis not present

## 2023-05-01 DIAGNOSIS — R1312 Dysphagia, oropharyngeal phase: Secondary | ICD-10-CM | POA: Diagnosis not present

## 2023-05-01 DIAGNOSIS — J9621 Acute and chronic respiratory failure with hypoxia: Secondary | ICD-10-CM | POA: Diagnosis not present

## 2023-05-01 DIAGNOSIS — J90 Pleural effusion, not elsewhere classified: Secondary | ICD-10-CM | POA: Diagnosis not present

## 2023-05-03 DIAGNOSIS — R1310 Dysphagia, unspecified: Secondary | ICD-10-CM | POA: Diagnosis not present

## 2023-05-03 DIAGNOSIS — J189 Pneumonia, unspecified organism: Secondary | ICD-10-CM | POA: Diagnosis not present

## 2023-05-04 DIAGNOSIS — J189 Pneumonia, unspecified organism: Secondary | ICD-10-CM | POA: Diagnosis not present

## 2023-05-04 DIAGNOSIS — R1310 Dysphagia, unspecified: Secondary | ICD-10-CM | POA: Diagnosis not present

## 2023-05-06 DIAGNOSIS — I4891 Unspecified atrial fibrillation: Secondary | ICD-10-CM | POA: Diagnosis not present

## 2023-05-06 DIAGNOSIS — I2510 Atherosclerotic heart disease of native coronary artery without angina pectoris: Secondary | ICD-10-CM | POA: Diagnosis not present

## 2023-05-06 DIAGNOSIS — J90 Pleural effusion, not elsewhere classified: Secondary | ICD-10-CM | POA: Diagnosis not present

## 2023-05-06 DIAGNOSIS — R1312 Dysphagia, oropharyngeal phase: Secondary | ICD-10-CM

## 2023-05-06 DIAGNOSIS — J9621 Acute and chronic respiratory failure with hypoxia: Secondary | ICD-10-CM | POA: Diagnosis not present

## 2023-05-07 DIAGNOSIS — J90 Pleural effusion, not elsewhere classified: Secondary | ICD-10-CM | POA: Diagnosis not present

## 2023-05-07 DIAGNOSIS — R1312 Dysphagia, oropharyngeal phase: Secondary | ICD-10-CM

## 2023-05-07 DIAGNOSIS — I2510 Atherosclerotic heart disease of native coronary artery without angina pectoris: Secondary | ICD-10-CM | POA: Diagnosis not present

## 2023-05-07 DIAGNOSIS — J9621 Acute and chronic respiratory failure with hypoxia: Secondary | ICD-10-CM | POA: Diagnosis not present

## 2023-05-07 DIAGNOSIS — I4891 Unspecified atrial fibrillation: Secondary | ICD-10-CM | POA: Diagnosis not present

## 2023-05-08 DIAGNOSIS — I2510 Atherosclerotic heart disease of native coronary artery without angina pectoris: Secondary | ICD-10-CM | POA: Diagnosis not present

## 2023-05-08 DIAGNOSIS — I4891 Unspecified atrial fibrillation: Secondary | ICD-10-CM | POA: Diagnosis not present

## 2023-05-08 DIAGNOSIS — J9621 Acute and chronic respiratory failure with hypoxia: Secondary | ICD-10-CM | POA: Diagnosis not present

## 2023-05-08 DIAGNOSIS — J90 Pleural effusion, not elsewhere classified: Secondary | ICD-10-CM | POA: Diagnosis not present

## 2023-05-08 DIAGNOSIS — R1312 Dysphagia, oropharyngeal phase: Secondary | ICD-10-CM

## 2023-07-10 ENCOUNTER — Other Ambulatory Visit: Payer: Self-pay

## 2023-07-10 ENCOUNTER — Inpatient Hospital Stay (HOSPITAL_COMMUNITY)

## 2023-07-10 ENCOUNTER — Inpatient Hospital Stay (HOSPITAL_COMMUNITY)
Admission: EM | Admit: 2023-07-10 | Discharge: 2023-07-30 | DRG: 698 | Disposition: A | Discharge status: Skilled Nursing Facility | Source: Other Acute Inpatient Hospital | Attending: Internal Medicine | Admitting: Internal Medicine

## 2023-07-10 ENCOUNTER — Encounter (HOSPITAL_COMMUNITY): Payer: Self-pay

## 2023-07-10 ENCOUNTER — Emergency Department (HOSPITAL_COMMUNITY)

## 2023-07-10 DIAGNOSIS — A4159 Other Gram-negative sepsis: Secondary | ICD-10-CM

## 2023-07-10 DIAGNOSIS — R319 Hematuria, unspecified: Secondary | ICD-10-CM | POA: Diagnosis not present

## 2023-07-10 DIAGNOSIS — E870 Hyperosmolality and hypernatremia: Principal | ICD-10-CM | POA: Diagnosis present

## 2023-07-10 DIAGNOSIS — Z930 Tracheostomy status: Secondary | ICD-10-CM | POA: Diagnosis not present

## 2023-07-10 DIAGNOSIS — Z9911 Dependence on respirator [ventilator] status: Secondary | ICD-10-CM | POA: Diagnosis not present

## 2023-07-10 DIAGNOSIS — X58XXXA Exposure to other specified factors, initial encounter: Secondary | ICD-10-CM | POA: Diagnosis present

## 2023-07-10 DIAGNOSIS — I2510 Atherosclerotic heart disease of native coronary artery without angina pectoris: Secondary | ICD-10-CM | POA: Diagnosis not present

## 2023-07-10 DIAGNOSIS — Z1635 Resistance to multiple antimicrobial drugs: Secondary | ICD-10-CM | POA: Diagnosis not present

## 2023-07-10 DIAGNOSIS — G9341 Metabolic encephalopathy: Secondary | ICD-10-CM | POA: Diagnosis present

## 2023-07-10 DIAGNOSIS — D649 Anemia, unspecified: Secondary | ICD-10-CM | POA: Diagnosis not present

## 2023-07-10 DIAGNOSIS — I69354 Hemiplegia and hemiparesis following cerebral infarction affecting left non-dominant side: Secondary | ICD-10-CM

## 2023-07-10 DIAGNOSIS — K5900 Constipation, unspecified: Secondary | ICD-10-CM | POA: Diagnosis not present

## 2023-07-10 DIAGNOSIS — J9611 Chronic respiratory failure with hypoxia: Secondary | ICD-10-CM | POA: Diagnosis present

## 2023-07-10 DIAGNOSIS — L89153 Pressure ulcer of sacral region, stage 3: Secondary | ICD-10-CM | POA: Diagnosis present

## 2023-07-10 DIAGNOSIS — Z1630 Resistance to unspecified antimicrobial drugs: Secondary | ICD-10-CM | POA: Diagnosis not present

## 2023-07-10 DIAGNOSIS — F0390 Unspecified dementia without behavioral disturbance: Secondary | ICD-10-CM | POA: Diagnosis present

## 2023-07-10 DIAGNOSIS — R7881 Bacteremia: Secondary | ICD-10-CM | POA: Diagnosis not present

## 2023-07-10 DIAGNOSIS — I10 Essential (primary) hypertension: Secondary | ICD-10-CM

## 2023-07-10 DIAGNOSIS — Y846 Urinary catheterization as the cause of abnormal reaction of the patient, or of later complication, without mention of misadventure at the time of the procedure: Secondary | ICD-10-CM | POA: Diagnosis present

## 2023-07-10 DIAGNOSIS — J9621 Acute and chronic respiratory failure with hypoxia: Secondary | ICD-10-CM | POA: Diagnosis not present

## 2023-07-10 DIAGNOSIS — N390 Urinary tract infection, site not specified: Secondary | ICD-10-CM | POA: Diagnosis not present

## 2023-07-10 DIAGNOSIS — Z515 Encounter for palliative care: Secondary | ICD-10-CM | POA: Diagnosis not present

## 2023-07-10 DIAGNOSIS — Z66 Do not resuscitate: Secondary | ICD-10-CM | POA: Diagnosis not present

## 2023-07-10 DIAGNOSIS — E860 Dehydration: Secondary | ICD-10-CM | POA: Diagnosis present

## 2023-07-10 DIAGNOSIS — K219 Gastro-esophageal reflux disease without esophagitis: Secondary | ICD-10-CM | POA: Diagnosis present

## 2023-07-10 DIAGNOSIS — I4891 Unspecified atrial fibrillation: Secondary | ICD-10-CM | POA: Diagnosis present

## 2023-07-10 DIAGNOSIS — E878 Other disorders of electrolyte and fluid balance, not elsewhere classified: Secondary | ICD-10-CM | POA: Diagnosis present

## 2023-07-10 DIAGNOSIS — A419 Sepsis, unspecified organism: Secondary | ICD-10-CM | POA: Diagnosis not present

## 2023-07-10 DIAGNOSIS — S301XXA Contusion of abdominal wall, initial encounter: Secondary | ICD-10-CM | POA: Diagnosis present

## 2023-07-10 DIAGNOSIS — N179 Acute kidney failure, unspecified: Secondary | ICD-10-CM | POA: Diagnosis present

## 2023-07-10 DIAGNOSIS — Z7189 Other specified counseling: Secondary | ICD-10-CM | POA: Diagnosis not present

## 2023-07-10 DIAGNOSIS — N1831 Chronic kidney disease, stage 3a: Secondary | ICD-10-CM | POA: Diagnosis present

## 2023-07-10 DIAGNOSIS — F4310 Post-traumatic stress disorder, unspecified: Secondary | ICD-10-CM | POA: Diagnosis present

## 2023-07-10 DIAGNOSIS — R6520 Severe sepsis without septic shock: Secondary | ICD-10-CM | POA: Diagnosis present

## 2023-07-10 DIAGNOSIS — A4150 Gram-negative sepsis, unspecified: Secondary | ICD-10-CM | POA: Diagnosis not present

## 2023-07-10 DIAGNOSIS — R601 Generalized edema: Secondary | ICD-10-CM | POA: Diagnosis present

## 2023-07-10 DIAGNOSIS — Y95 Nosocomial condition: Secondary | ICD-10-CM | POA: Diagnosis present

## 2023-07-10 DIAGNOSIS — G9340 Encephalopathy, unspecified: Secondary | ICD-10-CM | POA: Diagnosis not present

## 2023-07-10 DIAGNOSIS — B961 Klebsiella pneumoniae [K. pneumoniae] as the cause of diseases classified elsewhere: Secondary | ICD-10-CM | POA: Diagnosis not present

## 2023-07-10 DIAGNOSIS — J9610 Chronic respiratory failure, unspecified whether with hypoxia or hypercapnia: Secondary | ICD-10-CM | POA: Diagnosis not present

## 2023-07-10 DIAGNOSIS — I130 Hypertensive heart and chronic kidney disease with heart failure and stage 1 through stage 4 chronic kidney disease, or unspecified chronic kidney disease: Secondary | ICD-10-CM | POA: Diagnosis present

## 2023-07-10 DIAGNOSIS — E1122 Type 2 diabetes mellitus with diabetic chronic kidney disease: Secondary | ICD-10-CM | POA: Diagnosis present

## 2023-07-10 DIAGNOSIS — J151 Pneumonia due to Pseudomonas: Secondary | ICD-10-CM | POA: Diagnosis present

## 2023-07-10 DIAGNOSIS — Z79899 Other long term (current) drug therapy: Secondary | ICD-10-CM

## 2023-07-10 DIAGNOSIS — D631 Anemia in chronic kidney disease: Secondary | ICD-10-CM | POA: Diagnosis present

## 2023-07-10 DIAGNOSIS — L89629 Pressure ulcer of left heel, unspecified stage: Secondary | ICD-10-CM

## 2023-07-10 DIAGNOSIS — J150 Pneumonia due to Klebsiella pneumoniae: Secondary | ICD-10-CM | POA: Diagnosis present

## 2023-07-10 DIAGNOSIS — N400 Enlarged prostate without lower urinary tract symptoms: Secondary | ICD-10-CM | POA: Diagnosis present

## 2023-07-10 DIAGNOSIS — Z1613 Resistance to carbapenem: Secondary | ICD-10-CM | POA: Diagnosis present

## 2023-07-10 DIAGNOSIS — T83511A Infection and inflammatory reaction due to indwelling urethral catheter, initial encounter: Principal | ICD-10-CM | POA: Diagnosis present

## 2023-07-10 DIAGNOSIS — E876 Hypokalemia: Secondary | ICD-10-CM | POA: Diagnosis not present

## 2023-07-10 DIAGNOSIS — E43 Unspecified severe protein-calorie malnutrition: Secondary | ICD-10-CM | POA: Diagnosis present

## 2023-07-10 DIAGNOSIS — Z6828 Body mass index (BMI) 28.0-28.9, adult: Secondary | ICD-10-CM

## 2023-07-10 DIAGNOSIS — I5020 Unspecified systolic (congestive) heart failure: Secondary | ICD-10-CM

## 2023-07-10 DIAGNOSIS — G894 Chronic pain syndrome: Secondary | ICD-10-CM | POA: Diagnosis present

## 2023-07-10 DIAGNOSIS — E8809 Other disorders of plasma-protein metabolism, not elsewhere classified: Secondary | ICD-10-CM | POA: Diagnosis present

## 2023-07-10 DIAGNOSIS — D696 Thrombocytopenia, unspecified: Secondary | ICD-10-CM | POA: Diagnosis not present

## 2023-07-10 DIAGNOSIS — I5023 Acute on chronic systolic (congestive) heart failure: Secondary | ICD-10-CM | POA: Diagnosis not present

## 2023-07-10 DIAGNOSIS — J95851 Ventilator associated pneumonia: Secondary | ICD-10-CM | POA: Diagnosis present

## 2023-07-10 DIAGNOSIS — J9382 Other air leak: Secondary | ICD-10-CM | POA: Diagnosis not present

## 2023-07-10 DIAGNOSIS — E11649 Type 2 diabetes mellitus with hypoglycemia without coma: Secondary | ICD-10-CM | POA: Diagnosis not present

## 2023-07-10 DIAGNOSIS — E119 Type 2 diabetes mellitus without complications: Secondary | ICD-10-CM | POA: Diagnosis not present

## 2023-07-10 DIAGNOSIS — F411 Generalized anxiety disorder: Secondary | ICD-10-CM | POA: Diagnosis present

## 2023-07-10 DIAGNOSIS — Z794 Long term (current) use of insulin: Secondary | ICD-10-CM

## 2023-07-10 DIAGNOSIS — D638 Anemia in other chronic diseases classified elsewhere: Secondary | ICD-10-CM | POA: Diagnosis not present

## 2023-07-10 DIAGNOSIS — D539 Nutritional anemia, unspecified: Secondary | ICD-10-CM | POA: Diagnosis present

## 2023-07-10 DIAGNOSIS — L89620 Pressure ulcer of left heel, unstageable: Secondary | ICD-10-CM | POA: Diagnosis present

## 2023-07-10 DIAGNOSIS — E1165 Type 2 diabetes mellitus with hyperglycemia: Secondary | ICD-10-CM | POA: Diagnosis present

## 2023-07-10 DIAGNOSIS — Z951 Presence of aortocoronary bypass graft: Secondary | ICD-10-CM

## 2023-07-10 DIAGNOSIS — Z931 Gastrostomy status: Secondary | ICD-10-CM

## 2023-07-10 DIAGNOSIS — E785 Hyperlipidemia, unspecified: Secondary | ICD-10-CM | POA: Diagnosis present

## 2023-07-10 DIAGNOSIS — E440 Moderate protein-calorie malnutrition: Secondary | ICD-10-CM | POA: Insufficient documentation

## 2023-07-10 DIAGNOSIS — A498 Other bacterial infections of unspecified site: Secondary | ICD-10-CM | POA: Insufficient documentation

## 2023-07-10 DIAGNOSIS — Z7401 Bed confinement status: Secondary | ICD-10-CM

## 2023-07-10 HISTORY — DX: Muscle weakness (generalized): M62.81

## 2023-07-10 HISTORY — DX: Presence of aortocoronary bypass graft: Z95.1

## 2023-07-10 HISTORY — DX: Type 2 diabetes mellitus without complications: E11.9

## 2023-07-10 HISTORY — DX: Other bacterial infections of unspecified site: A49.8

## 2023-07-10 HISTORY — DX: Other sequelae of cerebral infarction: I69.398

## 2023-07-10 HISTORY — DX: Glaucoma in diseases classified elsewhere: H42

## 2023-07-10 HISTORY — DX: Disorder of kidney and ureter, unspecified: N28.9

## 2023-07-10 HISTORY — DX: Unspecified hearing loss, unspecified ear: H91.90

## 2023-07-10 HISTORY — DX: Pressure ulcer of left heel, unstageable: L89.620

## 2023-07-10 HISTORY — DX: Post-traumatic stress disorder, unspecified: F43.10

## 2023-07-10 HISTORY — DX: Dysphagia, oropharyngeal phase: R13.12

## 2023-07-10 HISTORY — DX: Unspecified dementia, unspecified severity, without behavioral disturbance, psychotic disturbance, mood disturbance, and anxiety: F03.90

## 2023-07-10 HISTORY — DX: Other symptoms and signs involving the musculoskeletal system: R29.898

## 2023-07-10 HISTORY — DX: Unspecified glaucoma: H40.9

## 2023-07-10 HISTORY — DX: Dependence on respirator (ventilator) status: Z99.11

## 2023-07-10 HISTORY — DX: Generalized anxiety disorder: F41.1

## 2023-07-10 HISTORY — DX: Unspecified atrial fibrillation: I48.91

## 2023-07-10 HISTORY — DX: Benign neoplasm of thyroid gland: D34

## 2023-07-10 HISTORY — DX: Other specified mononeuropathies of unspecified upper limb: G56.80

## 2023-07-10 HISTORY — DX: Pressure ulcer of sacral region, unspecified stage: L89.159

## 2023-07-10 HISTORY — DX: Disorder of thyroid, unspecified: E07.9

## 2023-07-10 HISTORY — DX: Traumatic subdural hemorrhage without loss of consciousness, sequela: S06.5X0S

## 2023-07-10 HISTORY — DX: Respiratory failure, unspecified, unspecified whether with hypoxia or hypercapnia: J96.90

## 2023-07-10 HISTORY — DX: Atherosclerotic heart disease of native coronary artery without angina pectoris: I25.10

## 2023-07-10 HISTORY — DX: Hyperkalemia: E87.5

## 2023-07-10 HISTORY — DX: Essential (primary) hypertension: I10

## 2023-07-10 HISTORY — DX: Unspecified systolic (congestive) heart failure: I50.20

## 2023-07-10 HISTORY — DX: Insomnia, unspecified: G47.00

## 2023-07-10 HISTORY — DX: Anemia, unspecified: D64.9

## 2023-07-10 HISTORY — DX: Pneumonitis due to inhalation of food and vomit: J69.0

## 2023-07-10 HISTORY — DX: Acute kidney failure, unspecified: N17.9

## 2023-07-10 HISTORY — DX: Chronic pain syndrome: G89.4

## 2023-07-10 HISTORY — DX: Peripheral vascular disease, unspecified: I73.9

## 2023-07-10 HISTORY — DX: Nutritional anemia, unspecified: D53.9

## 2023-07-10 HISTORY — DX: Encephalopathy, unspecified: G93.40

## 2023-07-10 HISTORY — DX: Acute and chronic respiratory failure with hypoxia: J96.21

## 2023-07-10 HISTORY — DX: Unqualified visual loss, both eyes: H54.3

## 2023-07-10 HISTORY — DX: Contusion of abdominal wall, initial encounter: S30.1XXA

## 2023-07-10 HISTORY — DX: Chronic kidney disease, stage 3 unspecified: N18.30

## 2023-07-10 HISTORY — DX: Hemiplegia and hemiparesis following cerebral infarction affecting left non-dominant side: I69.354

## 2023-07-10 HISTORY — DX: Acquired absence of unspecified foot: Z89.439

## 2023-07-10 HISTORY — DX: Complete traumatic amputation of one unspecified lesser toe, initial encounter: S98.139A

## 2023-07-10 HISTORY — DX: Gastro-esophageal reflux disease without esophagitis: K21.9

## 2023-07-10 LAB — BASIC METABOLIC PANEL WITH GFR
Anion gap: 10 (ref 5–15)
BUN: 112 mg/dL — ABNORMAL HIGH (ref 8–23)
CO2: 29 mmol/L (ref 22–32)
Calcium: 7.8 mg/dL — ABNORMAL LOW (ref 8.9–10.3)
Chloride: 113 mmol/L — ABNORMAL HIGH (ref 98–111)
Creatinine, Ser: 2.47 mg/dL — ABNORMAL HIGH (ref 0.61–1.24)
GFR, Estimated: 26 mL/min — ABNORMAL LOW (ref >60)
Glucose, Bld: 294 mg/dL — ABNORMAL HIGH (ref 70–99)
Potassium: 4.1 mmol/L (ref 3.5–5.1)
Sodium: 152 mmol/L — ABNORMAL HIGH (ref 135–145)

## 2023-07-10 LAB — LIPASE, BLOOD: Lipase: 32 U/L (ref 11–51)

## 2023-07-10 LAB — COMPREHENSIVE METABOLIC PANEL WITH GFR
ALT: 23 U/L (ref 0–44)
AST: 18 U/L (ref 15–41)
Albumin: 1.7 g/dL — ABNORMAL LOW (ref 3.5–5.0)
Alkaline Phosphatase: 231 U/L — ABNORMAL HIGH (ref 38–126)
Anion gap: 13 (ref 5–15)
BUN: 117 mg/dL — ABNORMAL HIGH (ref 8–23)
CO2: 28 mmol/L (ref 22–32)
Calcium: 7.8 mg/dL — ABNORMAL LOW (ref 8.9–10.3)
Chloride: 111 mmol/L (ref 98–111)
Creatinine, Ser: 2.53 mg/dL — ABNORMAL HIGH (ref 0.61–1.24)
GFR, Estimated: 25 mL/min — ABNORMAL LOW (ref >60)
Glucose, Bld: 269 mg/dL — ABNORMAL HIGH (ref 70–99)
Potassium: 4.4 mmol/L (ref 3.5–5.1)
Sodium: 152 mmol/L — ABNORMAL HIGH (ref 135–145)
Total Bilirubin: 0.9 mg/dL (ref 0.0–1.2)
Total Protein: 6.7 g/dL (ref 6.5–8.1)

## 2023-07-10 LAB — POCT I-STAT 7, (LYTES, BLD GAS, ICA,H+H)
Acid-Base Excess: 5 mmol/L — ABNORMAL HIGH (ref 0.0–2.0)
Bicarbonate: 28.3 mmol/L — ABNORMAL HIGH (ref 20.0–28.0)
Calcium, Ion: 1.06 mmol/L — ABNORMAL LOW (ref 1.15–1.40)
HCT: 22 % — ABNORMAL LOW (ref 39.0–52.0)
Hemoglobin: 7.5 g/dL — ABNORMAL LOW (ref 13.0–17.0)
O2 Saturation: 96 %
Patient temperature: 99.2
Potassium: 4.3 mmol/L (ref 3.5–5.1)
Sodium: 155 mmol/L — ABNORMAL HIGH (ref 135–145)
TCO2: 29 mmol/L (ref 22–32)
pCO2 arterial: 34.8 mmHg (ref 32–48)
pH, Arterial: 7.52 — ABNORMAL HIGH (ref 7.35–7.45)
pO2, Arterial: 71 mmHg — ABNORMAL LOW (ref 83–108)

## 2023-07-10 LAB — URINALYSIS, ROUTINE W REFLEX MICROSCOPIC
Bilirubin Urine: NEGATIVE
Glucose, UA: NEGATIVE mg/dL
Ketones, ur: NEGATIVE mg/dL
Nitrite: NEGATIVE
Protein, ur: 30 mg/dL — AB
Specific Gravity, Urine: 1.014 (ref 1.005–1.030)
WBC, UA: >50 WBC/hpf (ref 0–5)
pH: 7 (ref 5.0–8.0)

## 2023-07-10 LAB — PROCALCITONIN: Procalcitonin: 2.45 ng/mL

## 2023-07-10 LAB — FOLATE: Folate: 21.4 ng/mL (ref >5.9)

## 2023-07-10 LAB — CBC WITH DIFFERENTIAL/PLATELET
Abs Immature Granulocytes: 0.06 10*3/uL (ref 0.00–0.07)
Basophils Absolute: 0.1 10*3/uL (ref 0.0–0.1)
Basophils Relative: 1 %
Eosinophils Absolute: 0.3 10*3/uL (ref 0.0–0.5)
Eosinophils Relative: 3 %
HCT: 23.1 % — ABNORMAL LOW (ref 39.0–52.0)
Hemoglobin: 6.6 g/dL — CL (ref 13.0–17.0)
Immature Granulocytes: 1 %
Lymphocytes Relative: 6 %
Lymphs Abs: 0.6 10*3/uL — ABNORMAL LOW (ref 0.7–4.0)
MCH: 30 pg (ref 26.0–34.0)
MCHC: 28.6 g/dL — ABNORMAL LOW (ref 30.0–36.0)
MCV: 105 fL — ABNORMAL HIGH (ref 80.0–100.0)
Monocytes Absolute: 0.4 10*3/uL (ref 0.1–1.0)
Monocytes Relative: 5 %
Neutro Abs: 7.8 10*3/uL — ABNORMAL HIGH (ref 1.7–7.7)
Neutrophils Relative %: 84 %
Platelets: 162 10*3/uL (ref 150–400)
RBC: 2.2 MIL/uL — ABNORMAL LOW (ref 4.22–5.81)
RDW: 18.7 % — ABNORMAL HIGH (ref 11.5–15.5)
WBC: 9.2 10*3/uL (ref 4.0–10.5)
nRBC: 3.3 % — ABNORMAL HIGH (ref 0.0–0.2)

## 2023-07-10 LAB — LACTIC ACID, PLASMA: Lactic Acid, Venous: 1.1 mmol/L (ref 0.5–1.9)

## 2023-07-10 LAB — HEMOGLOBIN AND HEMATOCRIT, BLOOD
HCT: 25.6 % — ABNORMAL LOW (ref 39.0–52.0)
Hemoglobin: 7.6 g/dL — ABNORMAL LOW (ref 13.0–17.0)

## 2023-07-10 LAB — MAGNESIUM: Magnesium: 3.2 mg/dL — ABNORMAL HIGH (ref 1.7–2.4)

## 2023-07-10 LAB — FERRITIN: Ferritin: 276 ng/mL (ref 24–336)

## 2023-07-10 LAB — BRAIN NATRIURETIC PEPTIDE: B Natriuretic Peptide: 1637.6 pg/mL — ABNORMAL HIGH (ref 0.0–100.0)

## 2023-07-10 LAB — IRON AND TIBC
Iron: 50 ug/dL (ref 45–182)
Saturation Ratios: 31 % (ref 17.9–39.5)
TIBC: 164 ug/dL — ABNORMAL LOW (ref 250–450)
UIBC: 114 ug/dL

## 2023-07-10 LAB — POC OCCULT BLOOD, ED: Fecal Occult Bld: NEGATIVE

## 2023-07-10 LAB — RETICULOCYTES
Immature Retic Fract: 56.4 % — ABNORMAL HIGH (ref 2.3–15.9)
RBC.: 2.18 MIL/uL — ABNORMAL LOW (ref 4.22–5.81)
Retic Count, Absolute: 62.3 10*3/uL (ref 19.0–186.0)
Retic Ct Pct: 2.9 % (ref 0.4–3.1)

## 2023-07-10 LAB — PREPARE RBC (CROSSMATCH)

## 2023-07-10 LAB — TSH: TSH: 0.633 u[IU]/mL (ref 0.350–4.500)

## 2023-07-10 LAB — MRSA NEXT GEN BY PCR, NASAL: MRSA by PCR Next Gen: DETECTED — AB

## 2023-07-10 LAB — ABO/RH: ABO/RH(D): A POS

## 2023-07-10 LAB — VITAMIN B12: Vitamin B-12: 876 pg/mL (ref 180–914)

## 2023-07-10 LAB — GLUCOSE, CAPILLARY
Glucose-Capillary: 192 mg/dL — ABNORMAL HIGH (ref 70–99)
Glucose-Capillary: 271 mg/dL — ABNORMAL HIGH (ref 70–99)

## 2023-07-10 MED ORDER — LACTATED RINGERS IV SOLN: INTRAVENOUS | Status: DC

## 2023-07-10 MED ORDER — LOSARTAN POTASSIUM 50 MG PO TABS: 50.0000 mg | ORAL_TABLET | Freq: Every day | ORAL | Status: DC

## 2023-07-10 MED ORDER — PANTOPRAZOLE SODIUM 40 MG IV SOLR
40.0000 mg | Freq: Two times a day (BID) | INTRAVENOUS | Status: DC
  Administered 2023-07-10: 40 mg via INTRAVENOUS
  Filled 2023-07-10: qty 10

## 2023-07-10 MED ORDER — POLYVINYL ALCOHOL 1.4 % OP SOLN
1.0000 [drp] | OPHTHALMIC | Status: DC
  Administered 2023-07-10 – 2023-07-30 (×117): 1 [drp] via OPHTHALMIC
  Filled 2023-07-10: qty 15

## 2023-07-10 MED ORDER — MODAFINIL 100 MG PO TABS
100.0000 mg | ORAL_TABLET | Freq: Every day | ORAL | Status: DC
  Administered 2023-07-11 – 2023-07-30 (×20): 100 mg
  Filled 2023-07-10 (×21): qty 1

## 2023-07-10 MED ORDER — ORAL CARE MOUTH RINSE: 15.0000 mL | OROMUCOSAL | Status: DC | PRN

## 2023-07-10 MED ORDER — BRIMONIDINE TARTRATE 0.2 % OP SOLN
1.0000 [drp] | Freq: Three times a day (TID) | OPHTHALMIC | Status: DC
  Administered 2023-07-10 – 2023-07-30 (×59): 1 [drp] via OPHTHALMIC
  Filled 2023-07-10: qty 5

## 2023-07-10 MED ORDER — AMANTADINE HCL 100 MG PO CAPS
100.0000 mg | ORAL_CAPSULE | Freq: Every day | ORAL | Status: DC
  Administered 2023-07-11 – 2023-07-30 (×20): 100 mg
  Filled 2023-07-10 (×20): qty 1

## 2023-07-10 MED ORDER — FUROSEMIDE 10 MG/ML IJ SOLN: 60.0000 mg | Freq: Once | INTRAMUSCULAR | Status: DC

## 2023-07-10 MED ORDER — PIPERACILLIN-TAZOBACTAM 3.375 G IVPB
3.3750 g | Freq: Three times a day (TID) | INTRAVENOUS | Status: DC
  Administered 2023-07-10 – 2023-07-11 (×2): 3.375 g via INTRAVENOUS
  Filled 2023-07-10 (×2): qty 50

## 2023-07-10 MED ORDER — SODIUM CHLORIDE 0.9% IV SOLUTION: Freq: Once | INTRAVENOUS | Status: AC

## 2023-07-10 MED ORDER — HYDRALAZINE HCL 10 MG PO TABS: 10.0000 mg | ORAL_TABLET | Freq: Four times a day (QID) | ORAL | Status: DC | PRN

## 2023-07-10 MED ORDER — VANCOMYCIN VARIABLE DOSE PER UNSTABLE RENAL FUNCTION (PHARMACIST DOSING): Status: DC

## 2023-07-10 MED ORDER — SODIUM CHLORIDE 0.9 % IV BOLUS
1000.0000 mL | Freq: Once | INTRAVENOUS | Status: AC
  Administered 2023-07-10: 1000 mL via INTRAVENOUS

## 2023-07-10 MED ORDER — VANCOMYCIN HCL 2000 MG/400ML IV SOLN
2000.0000 mg | Freq: Once | INTRAVENOUS | Status: AC
  Administered 2023-07-10: 2000 mg via INTRAVENOUS
  Filled 2023-07-10: qty 400

## 2023-07-10 MED ORDER — AMLODIPINE BESYLATE 10 MG PO TABS
10.0000 mg | ORAL_TABLET | Freq: Every day | ORAL | Status: DC
  Administered 2023-07-11 – 2023-07-30 (×20): 10 mg
  Filled 2023-07-10 (×20): qty 1

## 2023-07-10 MED ORDER — CHLORHEXIDINE GLUCONATE CLOTH 2 % EX PADS
6.0000 | MEDICATED_PAD | Freq: Every day | CUTANEOUS | Status: DC
  Administered 2023-07-10: 6 via TOPICAL

## 2023-07-10 MED ORDER — CARBOXYMETHYLCELLULOSE SOD PF 0.5 % OP SOLN: 1.0000 [drp] | OPHTHALMIC | Status: DC

## 2023-07-10 MED ORDER — IPRATROPIUM-ALBUTEROL 0.5-2.5 (3) MG/3ML IN SOLN
3.0000 mL | Freq: Four times a day (QID) | RESPIRATORY_TRACT | Status: DC
  Administered 2023-07-10 – 2023-07-15 (×18): 3 mL via RESPIRATORY_TRACT
  Filled 2023-07-10 (×17): qty 3

## 2023-07-10 MED ORDER — INSULIN ASPART 100 UNIT/ML IJ SOLN
1.0000 [IU] | INTRAMUSCULAR | Status: DC
  Administered 2023-07-10: 3 [IU] via SUBCUTANEOUS

## 2023-07-10 MED ORDER — FUROSEMIDE 10 MG/ML IJ SOLN
40.0000 mg | Freq: Four times a day (QID) | INTRAMUSCULAR | Status: AC
  Administered 2023-07-10 – 2023-07-11 (×3): 40 mg via INTRAVENOUS
  Filled 2023-07-10 (×3): qty 4

## 2023-07-10 MED ORDER — IPRATROPIUM-ALBUTEROL 0.5-2.5 (3) MG/3ML IN SOLN
3.0000 mL | RESPIRATORY_TRACT | Status: DC | PRN
  Administered 2023-07-25: 3 mL via RESPIRATORY_TRACT

## 2023-07-10 MED ORDER — INSULIN ASPART 100 UNIT/ML IJ SOLN
0.0000 [IU] | INTRAMUSCULAR | Status: DC
  Administered 2023-07-10: 8 [IU] via SUBCUTANEOUS
  Administered 2023-07-10: 3 [IU] via SUBCUTANEOUS
  Administered 2023-07-11: 2 [IU] via SUBCUTANEOUS
  Administered 2023-07-11: 3 [IU] via SUBCUTANEOUS
  Administered 2023-07-11: 8 [IU] via SUBCUTANEOUS
  Administered 2023-07-11: 3 [IU] via SUBCUTANEOUS
  Administered 2023-07-11: 2 [IU] via SUBCUTANEOUS
  Administered 2023-07-12: 3 [IU] via SUBCUTANEOUS
  Administered 2023-07-12: 11 [IU] via SUBCUTANEOUS
  Administered 2023-07-12 (×3): 5 [IU] via SUBCUTANEOUS
  Administered 2023-07-12: 8 [IU] via SUBCUTANEOUS
  Administered 2023-07-13: 5 [IU] via SUBCUTANEOUS
  Administered 2023-07-13: 3 [IU] via SUBCUTANEOUS
  Administered 2023-07-13 (×2): 5 [IU] via SUBCUTANEOUS
  Administered 2023-07-13 (×2): 8 [IU] via SUBCUTANEOUS
  Administered 2023-07-14 (×3): 3 [IU] via SUBCUTANEOUS
  Administered 2023-07-15: 5 [IU] via SUBCUTANEOUS
  Administered 2023-07-15 (×2): 3 [IU] via SUBCUTANEOUS
  Administered 2023-07-16 – 2023-07-17 (×4): 2 [IU] via SUBCUTANEOUS

## 2023-07-10 MED ORDER — LACTATED RINGERS IV SOLN
INTRAVENOUS | Status: DC
Start: 2023-07-10 — End: 2023-07-10

## 2023-07-10 MED ORDER — ORAL CARE MOUTH RINSE
15.0000 mL | OROMUCOSAL | Status: DC
  Administered 2023-07-10 – 2023-07-14 (×48): 15 mL via OROMUCOSAL

## 2023-07-10 NOTE — Consult Note (Signed)
 NAME:  Albert Vance., MRN:  [id number], DOB:  Feb 06, 1946, LOS: 0 ADMISSION DATE:  07/10/2023, CONSULTATION DATE:  07/10/23 REFERRING MD:  Scarlette Currier, CHIEF COMPLAINT:  abnormal labs   History of Present Illness:  78 yo M PMH SDH (s/p mini crani/evac 10/23024 at [facility] c/b R cerebellar hemorrhage + seizures + failed extubation / aspiration event requiring trach) , chronic trach/vent, DM2 w hyperglycemia, HTN, Afib, who has been a resident of [facility] since 01/2023 when he was dc from [facility] for SDH/sequelae of. He presented to [facility] ED 5/1 from [facility] for further evaluation of abnormal labs -- noted to have elevated Cr and low hgb.  In ED Na 152 Glu 269 BUN 1`17 Cr 2.5 ALP 231 hgb 6.6 immature retic 56.4% Foley exchanged   HDS, on stable vent settings and without trach complications.   PCCM consulted for evaluation in this setting    Pertinent  Medical History  SDH Seizures (weaned off keppra)  DM HTN Chronic respiratory failure VDRF Trach in place Afib (no longer on doac)  Significant Hospital Events: Including procedures, antibiotic start and stop dates in addition to other pertinent events   5/1 1 PRBC for hgb 6.6. PCCM consult   Interim History / Subjective:  Rcvd 1 PRBC   Objective   Blood pressure (!) 138/53, pulse 71, temperature 98.6 F (37 C), temperature source Axillary, resp. rate 19, height 5\' 6"  (1.676 m), SpO2 99%.    Vent Mode: PRVC FiO2 (%):  [40 %] 40 % Set Rate:  [20 bmp] 20 bmp Vt Set:  [510 mL] 510 mL PEEP:  [5 cmH20] 5 cmH20  No intake or output data in the 24 hours ending 07/10/23 1511 There were no vitals filed for this visit.  Examination: General: Chronically ill M NAD on trach/vent  HENT: Trach secure. Portex 7, cuffed. Very dry mm and tongue Lungs: CTAb. Mechanically ventilated  Cardiovascular: irreg rhythm, s1s2  Abdomen: + PEG  Extremities: no acute joint deformity. Healed amputation Neuro: Opens eyes to stimulation. Does not follow commands.  Some spontaneous non-purposeful upper extremity movements  GU: foley  Resolved Hospital Problem list     Assessment & Plan:   Chronic respiratory failure Trach/vent dependence  -CXR w some bilat opacities- possible pulm edema, possible pna   P -Stable for TRH to admit & PCCM to follow for trach/vent  -cont MV support, routine trach care -wean as able  -vap pulm hygiene -I have ordered a PCT and a BNP which may help further delineate cxr findings  -w absence of leukocytosis, fever, am deferring abx at present   We will follow for trach/vent      Add'l problems below per primary, have included my thoughts  for consideration -   Encephalopathy Hx SDH s/p L crani evac c/b R cerebellar bleed  Hypernatremia AKI with uremia  Anemia  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 [employer] for presenting to ED was reportedly elevated Cr (not change in mental status) which would lead me to think this is/is close to his baseline. P -delirium precautions  -rec minimize CNS depressing meds  -if this is not his baseline, would get a CT H -As above, have ordered a BNP and PCT -I deferred add'l IVF for now (got 1L in ED + 1 PRBC) while  we wait for BNP. He seems dehydrated, but third spaced. Would favor FWF instead of IVF if BNP is not very elevated  -consider an ECHO but probably wont change management acutely -foley exchanged in ED. Strict I/O -follow renal indices UOP  -1 PRBC, H/H post transfusion. If inappropriate response would trend h/Hat incr fq  -BID PPI while etiology is being determined   GOC -facesheet from [facility] indicates full code -if this is his baseline QOL, would rec talking w family to ensure this is acceptable for pt vs palliation     Best Practice (right click and "Reselect all SmartList Selections" daily)    Per primary   Labs   CBC: Recent Labs  Lab 07/10/23 1156  WBC 9.2  NEUTROABS 7.8*  HGB 6.6*  HCT 23.1*  MCV 105.0*  PLT 162    Basic Metabolic Panel: Recent Labs  Lab 07/10/23 1156  NA 152*  K 4.4  CL 111  CO2 28  GLUCOSE 269*  BUN 117*  CREATININE 2.53*  CALCIUM 7.8*   GFR: CrCl cannot be calculated (Unknown ideal weight.). Recent Labs  Lab 07/10/23 1156  WBC 9.2    Liver Function Tests: Recent Labs  Lab 07/10/23 1156  AST 18  ALT 23  ALKPHOS 231*  BILITOT 0.9  PROT 6.7  ALBUMIN 1.7*   Recent Labs  Lab 07/10/23 1156  LIPASE 32   No results for input(s): "AMMONIA" in the last 168 hours.  ABG No results found for: "PHART", "PCO2ART", "PO2ART", "HCO3", "TCO2", "ACIDBASEDEF", "O2SAT"   Coagulation Profile: No results for input(s): "INR", "PROTIME" in the last 168 hours.  Cardiac Enzymes: No results for input(s): "CKTOTAL", "CKMB", "CKMBINDEX", "TROPONINI" in the last 168 hours.  HbA1C: No results found for: "HGBA1C"  CBG: No results for input(s): "GLUCAP" in the last 168 hours.  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), Anemia, Chronic kidney disease (CKD), stage III (moderate) (HCC), Chronic pain syndrome, Coronary artery disease, Dependence on respirator (ventilator) status (HCC), Diabetes mellitus without complication (HCC), Disorder of thyroid, Dysphagia, oropharyngeal phase, Encephalopathy, Generalized anxiety disorder, GERD (gastroesophageal reflux disease), Glaucoma in diseases classified elsewhere, Hemiplegia and hemiparesis following cerebral infarction affecting left non-dominant side (HCC), Hyperkalemia, Hypertension, Insomnia, Muscle weakness (generalized), Other sequelae of cerebral infarction, Pneumonitis due to inhalation of food  and vomit (HCC), PTSD (post-traumatic stress disorder), Renal disorder, Respiratory failure (HCC), Stroke (HCC), and 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   Surgical History:  History reviewed. No pertinent surgical history.   Social History:      Family History:  His family history is not on file.   Allergies Allergies  Allergen Reactions   Empagliflozin Other (See Comments)    Urinary tract infections   Codeine Other (See Comments)    Unknown    Gabapentin Other (See Comments)    Allergy not listed on MAR   Lisinopril Other (See Comments)    Unknown    Metformin And Related Other (See Comments)    Unknown      Home Medications  Prior to Admission medications   Medication Sig Start Date End Date Taking? Authorizing Provider  acetaminophen (TYLENOL) 325 MG tablet Place 650 mg into feeding tube every 6 (six) hours as needed for mild pain (pain score 1-3),  moderate pain (pain score 4-6) or fever.   Yes [provider]  amantadine  (SYMMETREL ) 100 MG capsule Place 100 mg into feeding tube daily.   Yes [provider]  amLODipine  (NORVASC ) 10 MG tablet Place 10 mg into feeding tube daily.   Yes [provider]  brimonidine  (ALPHAGAN ) 0.2 % ophthalmic solution Place 1 drop into both eyes every 8 (eight) hours.   Yes [provider]  Carboxymethylcellulose Sod PF 0.5 % SOLN Place 1 drop into both eyes every 4 (four) hours.   Yes [provider]  clonazePAM (KLONOPIN) 0.5 MG tablet Place 0.5 mg into feeding tube every 8 (eight) hours as needed for anxiety.   Yes [provider]  dorzolamide-timolol (COSOPT) 22.3-6.8 MG/ML ophthalmic solution Place 1 drop into both eyes 2 (two) times daily.   Yes [provider]  doxazosin (CARDURA) 2 MG tablet Place 2 mg into feeding tube at bedtime.   Yes [provider]  gabapentin (NEURONTIN) 100 MG capsule 200 mg at bedtime. Per tube   Yes  [provider]  Glucagon (GVOKE HYPOPEN 1-PACK) 1 MG/0.2ML SOAJ Inject 1 mg into the skin as needed (low bs).   Yes [provider]  Glucagon, rDNA, (GLUCAGON EMERGENCY) 1 MG KIT Inject 1 mg into the muscle as needed (low BS).   Yes [provider]  heparin 5000 UNIT/ML injection Inject 5,000 Units into the skin every 8 (eight) hours.   Yes [provider]  hydrALAZINE  (APRESOLINE ) 10 MG tablet Place 10 mg into feeding tube every 6 (six) hours as needed (high bp).   Yes [provider]  insulin  glargine (LANTUS) 100 UNIT/ML injection Inject 20 Units into the skin at bedtime.   Yes [provider]  insulin  lispro (HUMALOG) 100 UNIT/ML injection Inject 0-12 Units into the skin in the morning, at noon, in the evening, and at bedtime. BS 0-149 inject 0 units  BS 150-169 1 unit  BS 170-189 2 units  BS 190-209 3 units  BS 210-229 4 units  BS 230-249 5 units  BS 250-269 6 units  BS 270-289 7 units  BS 290-309 8 units  BS 310-329 9 units  BS 330-349 10 units  BS 350-369 11 units  BS 370-399 12 units   Yes [provider]  ipratropium-albuterol  (DUONEB) 0.5-2.5 (3) MG/3ML SOLN Take 3 mLs by nebulization every 6 (six) hours. PRN order: 3 ml per nebulization every 4 hours as needed for wheezing and SOB   Yes [provider]  lansoprazole (PREVACID) 30 MG capsule Place 30 mg into feeding tube daily at 12 noon.   Yes [provider]  latanoprost (XALATAN) 0.005 % ophthalmic solution Place 1 drop into both eyes at bedtime.   Yes [provider]  losartan  (COZAAR ) 50 MG tablet Place 50 mg into feeding tube in the morning and at bedtime.   Yes [provider]  Melatonin 3 MG TABS Place 6 mg into feeding tube at bedtime as needed (sleep).   Yes [provider]  modafinil  (PROVIGIL ) 100 MG tablet Place 100 mg into feeding tube daily.   Yes [provider]  Netarsudil Dimesylate (RHOPRESSA)  0.02 % SOLN Place 1 drop into both eyes at bedtime.   Yes [provider]  Nutritional Supplements (FEEDING SUPPLEMENT, GLUCERNA 1.5 CAL,) LIQD Place 60 mL/hr into feeding tube continuous.   Yes [provider]  Omega-3 Fatty Acids (FISH OIL) 1000 MG CAPS Place 1,000 mg into feeding tube  daily.   Yes [provider]  ondansetron (ZOFRAN) 4 MG tablet Place 4 mg into feeding tube every 6 (six) hours as needed for nausea or vomiting.   Yes [provider]  OVER THE COUNTER MEDICATION Take 1 spray by mouth every 2 (two) hours. Biotene moisturizing mouth spray   Yes [provider]  oxyCODONE (OXY IR/ROXICODONE) 5 MG immediate release tablet Place 2.5 mg into feeding tube every 6 (six) hours as needed for moderate pain (pain score 4-6) or severe pain (pain score 7-10).   Yes [provider]  simethicone (MYLICON) 80 MG chewable tablet Place 80 mg into feeding tube in the morning, at noon, and at bedtime.   Yes [provider]  sodium chloride  flush (BD POSIFLUSH) 0.9 % SOLN injection Inject 10 mLs into the vein See admin instructions. 10 ml intravenous by shift   Yes [provider]  sodium chloride  irrigation 0.9 % irrigation 10 mLs as needed (reason not listed for flush).   Yes [provider]     Critical care time: na      High MDM   Eston Hence MSN, AGACNP-BC [facility] Pulmonary/Critical Care Medicine Amion for pager  07/10/2023, 3:52 PM

## 2023-07-10 NOTE — ED Notes (Signed)
 Foley bag changed.  Urine sample will be pulled from port on new bag.

## 2023-07-10 NOTE — H&P (Addendum)
 Date: 07/10/2023               Patient Name:  Albert Vance. MRN: [id number]  DOB: 1945-10-18 Age / Sex: 78 y.o., male   PCP: Shackleford, Betsy Brown, MD         Medical Service: Internal Medicine Teaching Service         Attending Physician: Dr. Alwin Baars, Aretha Kubas, MD      First Contact: Maxie Spaniel, MD     Second Contact: Chrissie Coupe, MD          After Hours (After 5p/  First Contact Pager: [phone]  weekends / holidays): Second Contact Pager: [phone]   SUBJECTIVE   Chief Complaint: AKI  History of Present Illness: Albert Vance is a 78 year old male with history of stroke, CAD, hypertension, A-fib, CKD stage III, thyroid disease, and type 2 diabetes presenting to the ED from [facility] for worsening kidney function.  History complicated as patient is unable to speak and provide background information.  Per [facility], patient was admitted on 05/16/2023 secondary to acute on chronic hypoxic respiratory failure and subdural hematoma with subsequent global encephalopathy.  In short, he presented to [facility] in October 2024 for with worsening of a subdural hematoma that required North hole craniotomy.  He was started on a brain protocol secondary to lack of meaningful mentation; despite this protocol, Albert Vance could not regain gross functionality.  On presentation to [facility], he was fully intubated.  Tracheostomy was performed shortly after his admission.  Of note, he had multiple episodes of aspiration pneumonia requiring antibiotic treatment.  His ventilator was initially weaned off; he was noted to have had increased nasal cannula demands necessitating replacement of his trach.  He intermittently opens his eyes but was unable to speak.  ED Course: PCCM consulted Hemoglobin 6.6 MCV 105 Sodium 152 Creatinine 2.53 (baseline roughly 1.2) UA notable for large leukocytes and white blood cell greater than 50 X-ray worrisome for possible worsening pulmonary edema  Meds:  Amlodipine  10  mg/day Modafinil  100 mg Losartan  50 mg twice daily Doxazosin 2 mg (within hour sleeping) Amantadine  100 mg Gabapentin 100 mg (within hour of sleeping) Lansoprazole 30 mg daily Oxycodone 5 mg every 6 hours as needed Hydralazine  10 mg as needed every 6 hours as needed Clonazepam 0.5 mg as needed every 8 hours as needed Carboxymethylcellulose 0.5%  eye drops every 4 hours  Brimonidine  0.2% eye drops every 8 hours  Dorzolamide 22.3 mg-timolol 6.8 mg/mL 1 every 12 hours   Past Medical History Stroke CAD Hypertension A-fib CKD stage III Thyroid disease Type 2 diabetes  Social:  Currently at [facility] Dependent of ADLs and IADLs  Allergies: Allergies as of 07/10/2023 - Review Complete 07/10/2023  Allergen Reaction Noted   Empagliflozin Other (See Comments) 11/03/2022   Codeine Other (See Comments) 04/30/2019   Gabapentin Other (See Comments) 04/30/2019   Lisinopril Other (See Comments) 04/30/2019   Metformin and related Other (See Comments) 04/30/2019   Review of Systems: A complete ROS was negative except as per HPI.   OBJECTIVE:   Physical Exam: Blood pressure 137/67, pulse 69, temperature 98.5 F (36.9 C), temperature source Axillary, resp. rate (!) 22, height 5\' 6"  (1.676 m), SpO2 99%.  Constitutional: Chronically ill-appearing in no acute distress HENT: Trach in place Eyes: conjunctiva non-erythematous Neck: supple Cardiovascular: regular rate and rhythm, no m/r/g; anasarca Pulmonary/Chest: lungs clear to auscultation bilaterally Abdominal: PEG tube in place; hernia notable in lower abdomen MSK: Healed amputation of both  feet; ulceration present in left foot Neurological: Intermittently opens eyes; unable to participate in assessment Skin: warm and dry  Labs: CBC    Component Value Date/Time   WBC 9.2 07/10/2023 1156   RBC 2.18 (L) 07/10/2023 1247   RBC 2.20 (L) 07/10/2023 1156   HGB 6.6 (LL) 07/10/2023 1156   HCT 23.1 (L) 07/10/2023 1156   PLT 162  07/10/2023 1156   MCV 105.0 (H) 07/10/2023 1156   MCH 30.0 07/10/2023 1156   MCHC 28.6 (L) 07/10/2023 1156   RDW 18.7 (H) 07/10/2023 1156   LYMPHSABS 0.6 (L) 07/10/2023 1156   MONOABS 0.4 07/10/2023 1156   EOSABS 0.3 07/10/2023 1156   BASOSABS 0.1 07/10/2023 1156     CMP     Component Value Date/Time   NA 152 (H) 07/10/2023 1156   K 4.4 07/10/2023 1156   CL 111 07/10/2023 1156   CO2 28 07/10/2023 1156   GLUCOSE 269 (H) 07/10/2023 1156   BUN 117 (H) 07/10/2023 1156   CREATININE 2.53 (H) 07/10/2023 1156   CALCIUM 7.8 (L) 07/10/2023 1156   PROT 6.7 07/10/2023 1156   ALBUMIN 1.7 (L) 07/10/2023 1156   AST 18 07/10/2023 1156   ALT 23 07/10/2023 1156   ALKPHOS 231 (H) 07/10/2023 1156   BILITOT 0.9 07/10/2023 1156   GFRNONAA 25 (L) 07/10/2023 1156   GFRAA >60 04/30/2019 1047   Imaging: Chest X-Ray 1. Cardiomegaly. Mixed interstitial alveolar opacities throughout the lungs, similar to slightly worse than the prior study, worrisome for worsening pulmonary edema. Small bilateral pleural effusions. 2. More dense airspace consolidation in the left lower lobe, either from asymmetric edema, atelectasis, or superimposed pneumonia.  EKG: personally reviewed my interpretation is atrial fibrillation.   ASSESSMENT & PLAN:   Assessment & Plan by Problem: Principal Problem:   Hypernatremia Active Problems:   AKI (acute kidney injury) (HCC)   Ventilator dependent (HCC)   Symptomatic anemia   Anasarca   Pressure injury of skin of left heel   Albert Vance is a 78 year old male with history of stroke, CAD, hypertension, A-fib, CKD stage III, thyroid disease, and type 2 diabetes presenting to the ED from [facility] for worsening kidney function.  #AKI secondary to hypervolemic hyponatremia #Uremia Baseline creatinine roughly 1.2.  Presented with creatinine of 2.53 and sodium of 152.  BUN elevated at 117.  On examination, anasarca notable concerning for volume overload.  Suspect that  patient's elevated creatinine may be in the context of hypervolemic hyponatremia.  Patient does appear slightly dehydrated on assessment but does have third spacing.  Etiology unclear. Will give him a dose of IV Lasix  with strict ins and outs. - Strict I's and O's - IV Lasix  60 mg - Trend renal function - Follow-up renal ultrasound  #Macrocytic anemia Hemoglobin at admission reduced at 6.6 which was appropriately replenished.  B12 within normal limits.  Iron normal at 50 but TIBC reduced at 164.  Reticulocyte site index low at 0.9.  No clear signs of overt bleeding we will continue to monitor. - Follow-up posttransfusion hemoglobin - Follow-up folate  #Concern for UTI UA notable for cloudy appearance with large leukocytes and white blood cell count greater than 50.  Nitrites are negative.  Given his chronic Foley use, he is likely to present with these findings so unable to distinguish if this is an acute UTI.  He has been afebrile with no leukocytosis.  Nevertheless, we will replace his Foley monitor. - Replace Foley - Trend fever curve, CBC -  Follow blood cultures  #Pulmonary Edema vs Infiltrates PCCM following.  Chest x-ray notable for cardiomegaly with mixed interstitial alveolar opacities throughout the lungs.  There is some bilateral pleural effusions with concern for worsening pulmonary edema.  Furthermore, there are some more dense airspace consolidations in the left lower lobe concerning for atelectasis versus superimposed pneumonia.  Patient has no leukocytosis but high risk for aspiration pneumonia.  Hold off on antibiotics at this time but continue to monitor. - Trend fever curve, CBC - Follow-up procal - Follow-up BNP - Follow-up PCCM  #Atrial Fibrillation  EKG notable for atrial fibrillation but no RVR.  Cannot anticoagulate given history of brain bleeds.  Otherwise, patient hemodynamically stable and will continue to monitor. - Continue cardiac monitoring  #Goals of  care Patient currently full code.  Would consider engaging palliative care to discuss goals of care given patient's current presentation - Consider palliative consult  Diet:  PEG VTE: SCDs Code: Full  Prior to Admission Living Arrangement:  [facility]) Anticipated Discharge Location:  [facility] Barriers to Discharge: Improvement in kidney function  Dispo: Admit patient to Inpatient with expected length of stay greater than 2 midnights.  Signed: Maxie Spaniel, MD Internal Medicine Resident PGY-1  07/10/2023, 4:28 PM

## 2023-07-10 NOTE — ED Triage Notes (Signed)
 Patient BIB [facility] from [facility] for worsening renal disease. Patient does have a trach on arrival. Most recent BUN was 116, and most recent Creatinine was 2.10.

## 2023-07-10 NOTE — Hospital Course (Signed)
 Albert Balgobin. is a 78 y.o. male with history of chronic respiratory failure with ventilator dependent tracheostomy 2/2 SDH, HTN, T2DM, CAD, atrial fibrillation, and anemia of chronic illness who was admitted from [facility] hospital for sepsis due to Carbapenem resistant  Klebsiella bacteremia and AKI.    Klebsiella Bacteremia  Pulmonary Edema vs Infiltrates Chronic Respiratory Failure w/ Trach and Vent Dependence  ID consulted and patient was started on Avycaz  with plan for two week course with EOT 5/16. Source thought to be VAP vs indwelling foley catheter vs PICC line POA. In-dwelling foley catheter was replaced and PICC line was removed. He remained afebrile with occasional mild leukocytosis during admission. Repeat BC did not show growth after 4 days.    AKI on CKD 3A/B Hypovolemic Hypernatremia Hypoalbuminemia Anasarca Na 152 on admission. He was treated with free water and Na gradually improved as did kidney function. Third spacing secondary to malnutrition was treated with PEG tube feeds and supplemental albumin.     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 Anemia work-up unremarkable. Hgb remained stable during admission.    Hx of Atrial Fibrillation  Not a candidate for anticoagulation given history of SDH. Remained NSR during admission.  T2DM  CBG's within goal. Continue basal 22 units and novolog  to 8 units q 4 in addition to SSI.   Goals of Care Per discussion with wife, Albert Vance, patient is now DNR but full scope of care remains.

## 2023-07-10 NOTE — ED Provider Notes (Signed)
 [place] [facility] DEPARTMENT AT [facility] Provider Note   CSN: [id number] Arrival date & time: 07/10/23  1144     History  Chief Complaint  Patient presents with   Worsening Renal Disease    Albert Vance. is a 78 y.o. male.  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  Pt was sent here from [facility] for worsening kidney function.  Pt is unable to give any hx.    Vent setting: mode-PC, rate 16, PEEP 5, 40% Fio2, Cuffed Portex size 7       Home Medications Prior to Admission medications   Medication Sig Start Date End Date Taking? Authorizing Provider  acetaminophen (TYLENOL) 325 MG tablet Place 650 mg into feeding tube every 6 (six) hours as needed for mild pain (pain score 1-3), moderate pain (pain score 4-6) or fever.   Yes [provider]  amantadine  (SYMMETREL ) 100 MG capsule Place 100 mg into feeding tube daily.   Yes [provider]  amLODipine  (NORVASC ) 10 MG tablet Place 10 mg into feeding tube daily.   Yes [provider]  brimonidine  (ALPHAGAN ) 0.2 % ophthalmic solution Place 1 drop into both eyes every 8 (eight) hours.   Yes [provider]  Carboxymethylcellulose Sod PF 0.5 % SOLN Place 1 drop into both eyes every 4 (four) hours.   Yes [provider]  clonazePAM (KLONOPIN) 0.5 MG tablet Place 0.5 mg into feeding tube every 8 (eight) hours as needed for anxiety.   Yes [provider]  dorzolamide-timolol (COSOPT) 22.3-6.8 MG/ML ophthalmic solution Place 1 drop into both eyes 2 (two) times daily.   Yes [provider]  doxazosin (CARDURA) 2 MG tablet Place 2 mg into feeding tube at bedtime.   Yes [provider]  gabapentin (NEURONTIN) 100 MG capsule 200 mg at bedtime. Per tube   Yes [provider]   Glucagon (GVOKE HYPOPEN 1-PACK) 1 MG/0.2ML SOAJ Inject 1 mg into the skin as needed (low bs).   Yes [provider]  Glucagon, rDNA, (GLUCAGON EMERGENCY) 1 MG KIT Inject 1 mg into the muscle as needed (low BS).   Yes [provider]  heparin 5000 UNIT/ML injection Inject 5,000 Units into the skin every 8 (eight) hours.   Yes [provider]  hydrALAZINE  (APRESOLINE ) 10 MG tablet Place 10 mg into feeding tube every 6 (six) hours as needed (high bp).   Yes [provider]  insulin  glargine (LANTUS) 100 UNIT/ML injection Inject 20 Units into the skin at bedtime.   Yes [provider]  insulin  lispro (HUMALOG) 100 UNIT/ML injection Inject 0-12 Units into the skin in the morning, at noon, in the evening, and at bedtime. BS 0-149 inject 0 units  BS 150-169 1 unit  BS 170-189 2 units  BS 190-209 3 units  BS 210-229 4 units  BS 230-249 5 units  BS 250-269 6 units  BS 270-289 7 units  BS 290-309 8 units  BS 310-329 9 units  BS 330-349 10 units  BS 350-369 11 units  BS 370-399 12 units   Yes [provider]  ipratropium-albuterol  (DUONEB) 0.5-2.5 (3) MG/3ML SOLN Take 3 mLs by nebulization every 6 (six) hours. PRN order: 3 ml per nebulization every 4 hours as needed for wheezing and SOB  Yes [provider]  lansoprazole (PREVACID) 30 MG capsule Place 30 mg into feeding tube daily at 12 noon.   Yes [provider]  latanoprost (XALATAN) 0.005 % ophthalmic solution Place 1 drop into both eyes at bedtime.   Yes [provider]  losartan  (COZAAR ) 50 MG tablet Place 50 mg into feeding tube in the morning and at bedtime.   Yes [provider]  Melatonin 3 MG TABS Place 6 mg into feeding tube at bedtime as needed (sleep).   Yes [provider]  modafinil  (PROVIGIL ) 100 MG tablet Place 100 mg into feeding tube daily.   Yes [provider]  Netarsudil Dimesylate (RHOPRESSA) 0.02 % SOLN Place 1 drop  into both eyes at bedtime.   Yes [provider]  Nutritional Supplements (FEEDING SUPPLEMENT, GLUCERNA 1.5 CAL,) LIQD Place 60 mL/hr into feeding tube continuous.   Yes [provider]  Omega-3 Fatty Acids (FISH OIL) 1000 MG CAPS Place 1,000 mg into feeding tube daily.   Yes [provider]  ondansetron (ZOFRAN) 4 MG tablet Place 4 mg into feeding tube every 6 (six) hours as needed for nausea or vomiting.   Yes [provider]  OVER THE COUNTER MEDICATION Take 1 spray by mouth every 2 (two) hours. Biotene moisturizing mouth spray   Yes [provider]  oxyCODONE (OXY IR/ROXICODONE) 5 MG immediate release tablet Place 2.5 mg into feeding tube every 6 (six) hours as needed for moderate pain (pain score 4-6) or severe pain (pain score 7-10).   Yes [provider]  simethicone (MYLICON) 80 MG chewable tablet Place 80 mg into feeding tube in the morning, at noon, and at bedtime.   Yes [provider]  sodium chloride  flush (BD POSIFLUSH) 0.9 % SOLN injection Inject 10 mLs into the vein See admin instructions. 10 ml intravenous by shift   Yes [provider]  sodium chloride  irrigation 0.9 % irrigation 10 mLs as needed (reason not listed for flush).   Yes [provider]      Allergies    Empagliflozin, Codeine, Gabapentin, Lisinopril, and Metformin and related    Review of Systems   Review of Systems  Unable to perform ROS: Patient nonverbal    Physical Exam Updated Vital Signs BP 137/67   Pulse 69   Temp 98.5 F (36.9 C) (Axillary)   Resp (!) 22   Ht 5\' 6"  (1.676 m)   SpO2 99%   BMI 23.73 kg/m  Physical Exam Vitals and nursing note reviewed. Exam conducted with a chaperone present.  Constitutional:      Comments: Trach/ventilator for chronic resp failure  HENT:     Right Ear: External ear normal.     Left Ear: External ear normal.     Nose: Nose normal.     Mouth/Throat:     Mouth: Mucous membranes  are dry.     Pharynx: Oropharynx is clear.  Eyes:     Extraocular Movements: Extraocular movements intact.     Conjunctiva/sclera: Conjunctivae normal.     Pupils: Pupils are equal, round, and reactive to light.  Cardiovascular:     Rate and Rhythm: Regular rhythm. Bradycardia present.     Pulses: Normal pulses.     Heart sounds: Normal heart sounds.  Pulmonary:     Effort: Pulmonary effort is normal.     Breath sounds: Normal breath sounds.  Abdominal:     General: Abdomen is flat. Bowel sounds are normal.  Palpations: Abdomen is soft.     Comments: G tube noted  Genitourinary:    Rectum: Guaiac result negative.     Comments: Foley in place; Edema of penis and scrotum Stool is normal color Musculoskeletal:        General: Normal range of motion.     Cervical back: Normal range of motion and neck supple.     Right lower leg: Edema present.     Left lower leg: Edema present.  Skin:    General: Skin is warm.     Capillary Refill: Capillary refill takes less than 2 seconds.  Neurological:     Mental Status: He is easily aroused.     Comments: Pt will open his eyes.  He will squeeze his right hand.       ED Results / Procedures / Treatments   Labs (all labs ordered are listed, but only abnormal results are displayed) Labs Reviewed  CBC WITH DIFFERENTIAL/PLATELET - Abnormal; Notable for the following components:      Result Value   RBC 2.20 (*)    Hemoglobin 6.6 (*)    HCT 23.1 (*)    MCV 105.0 (*)    MCHC 28.6 (*)    RDW 18.7 (*)    nRBC 3.3 (*)    Neutro Abs 7.8 (*)    Lymphs Abs 0.6 (*)    All other components within normal limits  COMPREHENSIVE METABOLIC PANEL WITH GFR - Abnormal; Notable for the following components:   Sodium 152 (*)    Glucose, Bld 269 (*)    BUN 117 (*)    Creatinine, Ser 2.53 (*)    Calcium 7.8 (*)    Albumin 1.7 (*)    Alkaline Phosphatase 231 (*)    GFR, Estimated 25 (*)    All other components within normal limits  URINALYSIS,  ROUTINE W REFLEX MICROSCOPIC - Abnormal; Notable for the following components:   APPearance CLOUDY (*)    Hgb urine dipstick SMALL (*)    Protein, ur 30 (*)    Leukocytes,Ua LARGE (*)    Bacteria, UA FEW (*)    All other components within normal limits  IRON AND TIBC - Abnormal; Notable for the following components:   TIBC 164 (*)    All other components within normal limits  RETICULOCYTES - Abnormal; Notable for the following components:   RBC. 2.18 (*)    Immature Retic Fract 56.4 (*)    All other components within normal limits  CULTURE, BLOOD (ROUTINE X 2)  CULTURE, BLOOD (ROUTINE X 2)  LIPASE, BLOOD  VITAMIN B12  FERRITIN  FOLATE  HEMOGLOBIN AND HEMATOCRIT, BLOOD  PROCALCITONIN  BRAIN NATRIURETIC PEPTIDE  POC OCCULT BLOOD, ED  TYPE AND SCREEN  ABO/RH  PREPARE RBC (CROSSMATCH)    EKG EKG Interpretation Date/Time:  Thursday Jul 10 2023 11:47:12 EDT Ventricular Rate:  75 PR Interval:    QRS Duration:  102 QT Interval:  489 QTC Calculation: 547 R Axis:   44  Text Interpretation: Atrial fibrillation Borderline low voltage, extremity leads Nonspecific T abnrm, anterolateral leads Prolonged QT interval No significant change since last tracing Confirmed by Sueellen Emery [phone]) on 07/10/2023 12:48:11 PM  Radiology DG Chest Portable 1 View Result Date: 07/10/2023 CLINICAL DATA:  kidney failure EXAM: PORTABLE CHEST 1 VIEW COMPARISON:  March 25, 2022 FINDINGS: Cardiomegaly. Sternotomy wires and CABG markers. Mixed interstitial and alveolar opacities throughout the lungs, similar to slightly worse than the prior study. Bilateral pleural effusions. Retrocardiac  airspace consolidation. Humeral head anchors. Multilevel degenerative disc disease of the spine. Right PICC terminating in the lower SVC. IMPRESSION: 1. Cardiomegaly. Mixed interstitial alveolar opacities throughout the lungs, similar to slightly worse than the prior study, worrisome for worsening pulmonary edema. Small  bilateral pleural effusions. 2. More dense airspace consolidation in the left lower lobe, either from asymmetric edema, atelectasis, or superimposed pneumonia. Electronically Signed   By: Rance Burrows M.D.   On: 07/10/2023 13:29    Procedures Procedures    Medications Ordered in ED Medications  0.9 %  sodium chloride  infusion (Manually program via Guardrails IV Fluids) ( Intravenous New Bag/Given 07/10/23 1519)  sodium chloride  0.9 % bolus 1,000 mL (0 mLs Intravenous Stopped 07/10/23 1501)    ED Course/ Medical Decision Making/ A&P                                 Medical Decision Making Amount and/or Complexity of Data Reviewed Labs: ordered. Radiology: ordered.  Risk Prescription drug management. Decision regarding hospitalization.   This patient presents to the ED for concern of abn labs, this involves an extensive number of treatment options, and is a complaint that carries with it a high risk of complications and morbidity.  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  The pertinent results include:  cbc with hgb 6.6 (mcv elevated at 105)  last hgb 7.9 in November; cmp with na elevated at 152 and bun elevated at 117 and cr elevated at 2.53 (bun 29 and cr 0.8 on 11/16)   Imaging Studies ordered:  I ordered imaging studies including cxr  I independently visualized and interpreted imaging which showed   Cardiomegaly. Mixed interstitial alveolar opacities throughout  the lungs, similar to slightly  worse than the prior study, worrisome  for worsening pulmonary edema. Small bilateral pleural effusions.  2. More dense airspace consolidation in the left lower lobe, either  from asymmetric edema, atelectasis, or superimposed pneumonia.   I agree with the radiologist interpretation   Cardiac Monitoring:  The patient was maintained on a cardiac monitor.  I personally viewed and interpreted the cardiac monitored which showed an underlying rhythm of: nsr   Medicines ordered and prescription drug management:  I ordered medication including ivfs/transfusion  for sx  Reevaluation of the patient after these medicines showed that the patient improved I have reviewed the patients home medicines and have made adjustments as needed   Test Considered:  ct   Critical Interventions:  transfusion   Consultations Obtained:  I requested consultation with CCM,  and discussed lab and imaging findings as well as pertinent plan - they did see pt and recommend admission to medicine.  They will follow pulm and vent issues. Pt d/w IMTS for admission.  Problem List / ED Course:  Symptomatic anemia:  FOBT neg for blood.  MCV elevated.  Anemia panel sent.  1 unit blood ordered for transfusion.  The nurse and I tried calling wife, but neither of us  received an  answer.  No voice mail available.  Hypernatremia and aki:  ivfs given.  Pt does have hypoalbuminemia and anasarca, so may be 3rd spacing all his fluid. Chronic vent:  RT put pt on his usual vent settings   Reevaluation:  After the interventions noted above, I reevaluated the patient and found that they have :improved   Social Determinants of Health:  Lives at [facility]   Dispostion:  After consideration of the diagnostic results and the patients response to treatment, I feel that the patent would benefit from admission.    CRITICAL CARE Performed by: Sueellen Emery   Total critical care time: 30 minutes  Critical care time was  exclusive of separately billable procedures and treating other patients.  Critical care was necessary to treat or prevent imminent or life-threatening deterioration.  Critical care was time spent personally by me on the following activities: development of treatment plan with patient and/or surrogate as well as nursing, discussions with consultants, evaluation of patient's response to treatment, examination of patient, obtaining history from patient or surrogate, ordering and performing treatments and interventions, ordering and review of laboratory studies, ordering and review of radiographic studies, pulse oximetry and re-evaluation of patient's condition.         Final Clinical Impression(s) / ED Diagnoses Final diagnoses:  Hypernatremia  Symptomatic anemia  Ventilator dependent (HCC)  AKI (acute kidney injury) (HCC)  Anasarca  Hypoalbuminemia    Rx / DC Orders ED Discharge Orders     None         Sueellen Emery, MD 07/10/23 1545

## 2023-07-10 NOTE — Progress Notes (Signed)
 eLink Physician-Brief Progress Note Patient Name: Leonel Wakley. 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 Dementia, Afib (off NOAC), PCM, and anemia of chronic illness.   Called about persistent hyperglycemia in the setting of uncontrolled diabetes mellitus  eICU Interventions  Changed from sensitive to moderate scale     Intervention Category Intermediate Interventions: Hyperglycemia - evaluation and treatment  Tyrick Dunagan 07/10/2023, 8:09 PM

## 2023-07-10 NOTE — ED Notes (Addendum)
 EDP and this writer attempted to call Pt's wife to obtain consent for blood transfusion x2 after obtaining correct phone number from [facility].  Wife is not answering.  EDP directed this writer to proceed w/ Emergency Blood transfusion.

## 2023-07-10 NOTE — Progress Notes (Signed)
 Pt transported to 3M05 on vent without complications.

## 2023-07-10 NOTE — Consult Note (Addendum)
 NAME:  Albert Macarthur., MRN:  [id number], DOB:  March 30, 1945, LOS: 0 ADMISSION DATE:  07/10/2023, CONSULTATION DATE:  07/10/23 REFERRING MD:  Scarlette Currier, CHIEF COMPLAINT:  abnormal labs   History of Present Illness:  A 78 yr old male patient with hx of SDH (s/p mini crani/evac 12/2022 at [facility] c/b R cerebellar hemorrhage and seizures), multiple aspiration PNA, failed extubation, chronic resp failure requiring trach/PEG, PTSD, DM-2, HTN, CAD, GERD, ? Dementia, Afib (off NOAC), PCM, and anemia of chronic illness. He has been a resident of [facility] since 01/2023 when he was dc from [facility] for SDH/sequelae of. He presented to [facility] ED 5/1 from [facility] for further evaluation of abnormal labs -- noted to have elevated Cr and low hgb. Foley exchanged. Given one unit of PRBC and 1 L NS 0.9% bolus.   On stable vent settings (PRVC 20/510/+5/40%) and without trach complications.  PCCM consulted for evaluation in this setting    Pertinent  Medical History  SDH, seizures (weaned off keppra), multiple aspiration PNA, failed extubation, chronic resp failure requiring trach/PEG, PTSD, DM-2, HTN, CAD, GERD, ? Dementia, Afib (off NOAC), PCM, and anemia of chronic illness Significant Hospital Events: Including procedures, antibiotic start and stop dates in addition to other pertinent events   5/1 1 PRBC for hgb 6.6. PCCM consult   Interim History / Subjective:  Rcvd 1 PRBC   Objective   Blood pressure 133/68, pulse 69, temperature 98.5 F (36.9 C), temperature source Axillary, resp. rate (!) 21, height 5\' 6"  (1.676 m), SpO2 99%.    Vent Mode: PRVC FiO2 (%):  [40 %] 40 % Set Rate:  [20 bmp] 20 bmp Vt Set:  [510 mL] 510 mL PEEP:  [5 cmH20] 5 cmH20  No intake or output data in the 24 hours ending 07/10/23 1645 There were no vitals filed for this visit.  Examination: General: Chronically ill M NAD on trach/vent  HENT: Trach secure. Portex 7, cuffed. Very dry mm and tongue Lungs: basal crackles. No wheezing.  Mechanically ventilated  Cardiovascular: irreg rhythm (Afib), s1s2  Abdomen: + PEG. Abd wall hematomas  Extremities: +2 edema. No acute joint deformity. Healed amputation Neuro: Opens eyes to verbal stimulation. Tracking. Does not follow commands. Some spontaneous non-purposeful upper extremity movements  GU: foley (clear urine)   Resolved Hospital Problem list     Assessment & Plan:  Chronic respiratory failure Trach/vent dependence  -CXR w some bilat opacities- possible pulm edema, possible pna   Plan -Stable for TRH to admit & PCCM to follow for trach/vent  -cont MV support, routine trach care. Wean as tolerated  -ABG -VAP pulm hygiene -Repeat Echo -IV lasix  -I/O chart -Am labs -PCT, BNP, TST, PO4, Mg, HbA1c,  -We will follow for trach/vent   HyperNa -LR -IV lasix  -Am labs  AKI/?CKD -IVF -IV Lasix  -Am labs -Foley exchanged in ED. Strict I/O -Renal U/S  SDH (s/p mini crani/evac 12/2022 at [facility] c/b R cerebellar hemorrhage and seizures) and metabolic encephalopathy, ? Dementia -delirium precautions  -minimize CNS depressing meds  -TFT -Ammonia level  DM-2 -HbA1c -Glycemic control  HTN, CAD, s/p CABG, S-CHF (EF at [facility] 44%)   Afib (off NOAC) -Monitor BP and HR -Echo -IV Lasix   GERD -PPI for now  Severe PCM -Dietitian consult  Acute anemia on chronic anemia of chronic illness: abd wall hematomas  -Am labs -SCD -Blood Tx for Hb>7  Best Practice (right click and "Reselect all SmartList Selections" daily)   Per primary   Labs  CBC: Recent Labs  Lab 07/10/23 1156  WBC 9.2  NEUTROABS 7.8*  HGB 6.6*  HCT 23.1*  MCV 105.0*  PLT 162    Basic Metabolic Panel: Recent Labs  Lab 07/10/23 1156  NA 152*  K 4.4  CL 111  CO2 28  GLUCOSE 269*  BUN 117*  CREATININE 2.53*  CALCIUM 7.8*   GFR: CrCl cannot be calculated (Unknown ideal weight.). Recent Labs  Lab 07/10/23 1156  WBC 9.2    Liver Function Tests: Recent Labs  Lab  07/10/23 1156  AST 18  ALT 23  ALKPHOS 231*  BILITOT 0.9  PROT 6.7  ALBUMIN 1.7*   Recent Labs  Lab 07/10/23 1156  LIPASE 32   No results for input(s): "AMMONIA" in the last 168 hours.  ABG No results found for: "PHART", "PCO2ART", "PO2ART", "HCO3", "TCO2", "ACIDBASEDEF", "O2SAT"   Coagulation Profile: No results for input(s): "INR", "PROTIME" in the last 168 hours.  Cardiac Enzymes: No results for input(s): "CKTOTAL", "CKMB", "CKMBINDEX", "TROPONINI" in the last 168 hours.  HbA1C: No results found for: "HGBA1C"  CBG: No results for input(s): "GLUCAP" in the last 168 hours.  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), Anemia, Chronic kidney disease (CKD), stage III (moderate) (HCC), Chronic pain syndrome, Coronary artery disease, Dependence on respirator (ventilator) status (HCC), Diabetes mellitus without complication (HCC), Disorder of thyroid, Dysphagia, oropharyngeal phase, Encephalopathy, Generalized anxiety disorder, GERD (gastroesophageal reflux disease), Glaucoma in diseases classified elsewhere, Hemiplegia and hemiparesis following cerebral infarction affecting left non-dominant side (HCC), Hyperkalemia, Hypertension, Insomnia, Muscle weakness (generalized), Other sequelae of cerebral infarction, Pneumonitis due to inhalation of food and vomit (HCC), PTSD (post-traumatic stress disorder), Renal disorder, Respiratory failure (HCC), Stroke (HCC), and 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   Surgical History:  History reviewed. No pertinent surgical history.   Social History:      Family History:  His family history is not on file.   Allergies Allergies  Allergen Reactions   Empagliflozin Other (See Comments)    Urinary tract  infections   Codeine Other (See Comments)    Unknown    Gabapentin Other (See Comments)    Allergy not listed on MAR   Lisinopril Other (See Comments)    Unknown    Metformin And Related Other (See Comments)    Unknown      Home Medications  Prior to Admission medications   Medication Sig Start Date End Date Taking? Authorizing Provider  acetaminophen (TYLENOL) 325 MG tablet Place 650 mg into feeding tube every 6 (six) hours as needed for mild pain (pain score 1-3), moderate pain (pain score 4-6) or fever.   Yes [provider]  amantadine  (SYMMETREL ) 100 MG capsule Place 100 mg into feeding tube daily.   Yes [provider]  amLODipine  (NORVASC ) 10 MG tablet Place 10 mg into feeding tube daily.   Yes [provider]  brimonidine  (ALPHAGAN ) 0.2 % ophthalmic solution Place 1 drop into both eyes every 8 (eight) hours.   Yes [provider]  Carboxymethylcellulose Sod PF 0.5 % SOLN Place 1 drop into both eyes every 4 (four) hours.   Yes [provider]  clonazePAM (KLONOPIN) 0.5 MG tablet Place 0.5 mg into feeding tube every 8 (eight) hours as needed for  anxiety.   Yes [provider]  dorzolamide-timolol (COSOPT) 22.3-6.8 MG/ML ophthalmic solution Place 1 drop into both eyes 2 (two) times daily.   Yes [provider]  doxazosin (CARDURA) 2 MG tablet Place 2 mg into feeding tube at bedtime.   Yes [provider]  gabapentin (NEURONTIN) 100 MG capsule 200 mg at bedtime. Per tube   Yes [provider]  Glucagon (GVOKE HYPOPEN 1-PACK) 1 MG/0.2ML SOAJ Inject 1 mg into the skin as needed (low bs).   Yes [provider]  Glucagon, rDNA, (GLUCAGON EMERGENCY) 1 MG KIT Inject 1 mg into the muscle as needed (low BS).   Yes [provider]  heparin 5000 UNIT/ML injection Inject 5,000 Units into the skin every 8 (eight) hours.   Yes [provider]  hydrALAZINE  (APRESOLINE ) 10 MG tablet Place 10 mg  into feeding tube every 6 (six) hours as needed (high bp).   Yes [provider]  insulin  glargine (LANTUS) 100 UNIT/ML injection Inject 20 Units into the skin at bedtime.   Yes [provider]  insulin  lispro (HUMALOG) 100 UNIT/ML injection Inject 0-12 Units into the skin in the morning, at noon, in the evening, and at bedtime. BS 0-149 inject 0 units  BS 150-169 1 unit  BS 170-189 2 units  BS 190-209 3 units  BS 210-229 4 units  BS 230-249 5 units  BS 250-269 6 units  BS 270-289 7 units  BS 290-309 8 units  BS 310-329 9 units  BS 330-349 10 units  BS 350-369 11 units  BS 370-399 12 units   Yes [provider]  ipratropium-albuterol  (DUONEB) 0.5-2.5 (3) MG/3ML SOLN Take 3 mLs by nebulization every 6 (six) hours. PRN order: 3 ml per nebulization every 4 hours as needed for wheezing and SOB   Yes [provider]  lansoprazole (PREVACID) 30 MG capsule Place 30 mg into feeding tube daily at 12 noon.   Yes [provider]  latanoprost (XALATAN) 0.005 % ophthalmic solution Place 1 drop into both eyes at bedtime.   Yes [provider]  losartan  (COZAAR ) 50 MG tablet Place 50 mg into feeding tube in the morning and at bedtime.   Yes [provider]  Melatonin 3 MG TABS Place 6 mg into feeding tube at bedtime as needed (sleep).   Yes [provider]  modafinil  (PROVIGIL ) 100 MG tablet Place 100 mg into feeding tube daily.   Yes [provider]  Netarsudil Dimesylate (RHOPRESSA) 0.02 % SOLN Place 1 drop into both eyes at bedtime.   Yes [provider]  Nutritional Supplements (FEEDING SUPPLEMENT, GLUCERNA 1.5 CAL,) LIQD Place 60 mL/hr into feeding tube continuous.   Yes [provider]  Omega-3 Fatty Acids (FISH OIL) 1000 MG CAPS Place 1,000 mg into feeding tube daily.   Yes [provider]  ondansetron (ZOFRAN) 4 MG tablet Place 4 mg into feeding tube every 6 (six) hours as needed for nausea or  vomiting.   Yes [provider]  OVER THE COUNTER MEDICATION Take 1 spray by mouth every 2 (two) hours. Biotene moisturizing mouth spray   Yes [provider]  oxyCODONE (OXY IR/ROXICODONE) 5 MG immediate release tablet Place 2.5 mg into feeding tube every 6 (six) hours as needed for moderate pain (pain score 4-6) or severe pain (pain score 7-10).   Yes [provider]  simethicone (MYLICON) 80 MG chewable tablet Place 80 mg into feeding tube in the morning, at noon,  and at bedtime.   Yes [provider]  sodium chloride  flush (BD POSIFLUSH) 0.9 % SOLN injection Inject 10 mLs into the vein See admin instructions. 10 ml intravenous by shift   Yes [provider]  sodium chloride  irrigation 0.9 % irrigation 10 mLs as needed (reason not listed for flush).   Yes [provider]     Critical care time: 79 min     Arlyne Bering, MD [place] for pager  07/10/2023, 4:45 PM

## 2023-07-10 NOTE — Progress Notes (Signed)
 PHARMACY ANTIBIOTIC CONSULT NOTE   Albert Vance. a 78 y.o. male admitted on 07/10/23 with PMH of SDH s/p bur holes on chronic vent/trach/PEG since 12/2022 presenting from [facility] with worsening renal function and shock.  Pharmacy has been consulted for vancomycin  and zosyn  dosing.  5/1: Scr 2.47, WBC 9.2, afebrile  Estimated Creatinine Clearance: 24 mL/min (A) (by C-G formula based on SCr of 2.47 mg/dL (H)).  Plan: START Zosyn  3.375 g IV Q8H (EI)  START Vancomycin  2000 mg IV x1, then further dosing based on renal function F/u VR in AM Monitor renal function, clinical status, C/S, de-escalation   Allergies:  Allergies  Allergen Reactions   Empagliflozin Other (See Comments)    Urinary tract infections   Codeine Other (See Comments)    Unknown    Gabapentin Other (See Comments)    Allergy not listed on MAR   Lisinopril Other (See Comments)    Unknown    Metformin And Related Other (See Comments)    Unknown     Filed Weights   07/10/23 1742  Weight: 79.4 kg (175 lb 0.7 oz)       Latest Ref Rng & Units 07/10/2023    6:32 PM 07/10/2023    6:15 PM 07/10/2023   11:56 AM  CBC  WBC 4.0 - 10.5 K/uL   9.2   Hemoglobin 13.0 - 17.0 g/dL 7.6  7.5  6.6   Hematocrit 39.0 - 52.0 % 25.6  22.0  23.1   Platelets 150 - 400 K/uL   162     Antibiotics Given (last 72 hours)     Date/Time Action Medication Dose Rate   07/10/23 2045 New Bag/Given   vancomycin  (VANCOREADY) IVPB 2000 mg/400 mL 2,000 mg 200 mL/hr       Antimicrobials this admission: Vancomycin  5/1 > c Zosyn  5/1 > c  Microbiology results: 5/1 Bcx: sent 5/1 MRSA PCR: positive  Thank you for allowing pharmacy to be a part of this patient's care.  Cecillia Cogan, PharmD Clinical Pharmacist 07/10/2023  8:57 PM

## 2023-07-11 ENCOUNTER — Inpatient Hospital Stay (HOSPITAL_COMMUNITY)

## 2023-07-11 DIAGNOSIS — Z1635 Resistance to multiple antimicrobial drugs: Secondary | ICD-10-CM

## 2023-07-11 DIAGNOSIS — E8809 Other disorders of plasma-protein metabolism, not elsewhere classified: Secondary | ICD-10-CM | POA: Diagnosis not present

## 2023-07-11 DIAGNOSIS — I2510 Atherosclerotic heart disease of native coronary artery without angina pectoris: Secondary | ICD-10-CM

## 2023-07-11 DIAGNOSIS — R601 Generalized edema: Secondary | ICD-10-CM | POA: Diagnosis not present

## 2023-07-11 DIAGNOSIS — E870 Hyperosmolality and hypernatremia: Secondary | ICD-10-CM | POA: Diagnosis not present

## 2023-07-11 DIAGNOSIS — J9610 Chronic respiratory failure, unspecified whether with hypoxia or hypercapnia: Secondary | ICD-10-CM | POA: Diagnosis not present

## 2023-07-11 DIAGNOSIS — A419 Sepsis, unspecified organism: Secondary | ICD-10-CM | POA: Diagnosis not present

## 2023-07-11 DIAGNOSIS — R7881 Bacteremia: Secondary | ICD-10-CM | POA: Diagnosis not present

## 2023-07-11 DIAGNOSIS — Z930 Tracheostomy status: Secondary | ICD-10-CM | POA: Diagnosis not present

## 2023-07-11 DIAGNOSIS — R6520 Severe sepsis without septic shock: Secondary | ICD-10-CM

## 2023-07-11 DIAGNOSIS — N179 Acute kidney failure, unspecified: Secondary | ICD-10-CM | POA: Diagnosis not present

## 2023-07-11 DIAGNOSIS — A4159 Other Gram-negative sepsis: Secondary | ICD-10-CM

## 2023-07-11 LAB — BLOOD CULTURE ID PANEL (REFLEXED) - BCID2
A.calcoaceticus-baumannii: NOT DETECTED
Bacteroides fragilis: NOT DETECTED
CTX-M ESBL: NOT DETECTED
Candida albicans: NOT DETECTED
Candida auris: NOT DETECTED
Candida glabrata: NOT DETECTED
Candida krusei: NOT DETECTED
Candida parapsilosis: NOT DETECTED
Candida tropicalis: NOT DETECTED
Carbapenem resist OXA 48 LIKE: NOT DETECTED
Carbapenem resistance IMP: NOT DETECTED
Carbapenem resistance KPC: DETECTED — AB
Carbapenem resistance NDM: NOT DETECTED
Carbapenem resistance VIM: NOT DETECTED
Cryptococcus neoformans/gattii: NOT DETECTED
Enterobacter cloacae complex: NOT DETECTED
Enterobacterales: DETECTED — AB
Enterococcus Faecium: NOT DETECTED
Enterococcus faecalis: NOT DETECTED
Escherichia coli: NOT DETECTED
Haemophilus influenzae: NOT DETECTED
Klebsiella aerogenes: NOT DETECTED
Klebsiella oxytoca: NOT DETECTED
Klebsiella pneumoniae: DETECTED — AB
Listeria monocytogenes: NOT DETECTED
Neisseria meningitidis: NOT DETECTED
Proteus species: NOT DETECTED
Pseudomonas aeruginosa: NOT DETECTED
Salmonella species: NOT DETECTED
Serratia marcescens: NOT DETECTED
Staphylococcus aureus (BCID): NOT DETECTED
Staphylococcus epidermidis: NOT DETECTED
Staphylococcus lugdunensis: NOT DETECTED
Staphylococcus species: NOT DETECTED
Stenotrophomonas maltophilia: NOT DETECTED
Streptococcus agalactiae: NOT DETECTED
Streptococcus pneumoniae: NOT DETECTED
Streptococcus pyogenes: NOT DETECTED
Streptococcus species: NOT DETECTED

## 2023-07-11 LAB — GLUCOSE, CAPILLARY
Glucose-Capillary: 147 mg/dL — ABNORMAL HIGH (ref 70–99)
Glucose-Capillary: 149 mg/dL — ABNORMAL HIGH (ref 70–99)
Glucose-Capillary: 169 mg/dL — ABNORMAL HIGH (ref 70–99)
Glucose-Capillary: 188 mg/dL — ABNORMAL HIGH (ref 70–99)
Glucose-Capillary: 264 mg/dL — ABNORMAL HIGH (ref 70–99)
Glucose-Capillary: 292 mg/dL — ABNORMAL HIGH (ref 70–99)
Glucose-Capillary: 323 mg/dL — ABNORMAL HIGH (ref 70–99)

## 2023-07-11 LAB — BASIC METABOLIC PANEL WITH GFR
Anion gap: 9 (ref 5–15)
Anion gap: 9 (ref 5–15)
Anion gap: 12 (ref 5–15)
BUN: 110 mg/dL — ABNORMAL HIGH (ref 8–23)
BUN: 103 mg/dL — ABNORMAL HIGH (ref 8–23)
BUN: 102 mg/dL — ABNORMAL HIGH (ref 8–23)
CO2: 29 mmol/L (ref 22–32)
CO2: 29 mmol/L (ref 22–32)
CO2: 28 mmol/L (ref 22–32)
Calcium: 7.7 mg/dL — ABNORMAL LOW (ref 8.9–10.3)
Calcium: 7.4 mg/dL — ABNORMAL LOW (ref 8.9–10.3)
Calcium: 7.9 mg/dL — ABNORMAL LOW (ref 8.9–10.3)
Chloride: 113 mmol/L — ABNORMAL HIGH (ref 98–111)
Chloride: 114 mmol/L — ABNORMAL HIGH (ref 98–111)
Chloride: 113 mmol/L — ABNORMAL HIGH (ref 98–111)
Creatinine, Ser: 2.34 mg/dL — ABNORMAL HIGH (ref 0.61–1.24)
Creatinine, Ser: 2.28 mg/dL — ABNORMAL HIGH (ref 0.61–1.24)
Creatinine, Ser: 2.33 mg/dL — ABNORMAL HIGH (ref 0.61–1.24)
GFR, Estimated: 28 mL/min — ABNORMAL LOW (ref >60)
GFR, Estimated: 28 mL/min — ABNORMAL LOW (ref >60)
GFR, Estimated: 28 mL/min — ABNORMAL LOW (ref >60)
Glucose, Bld: 205 mg/dL — ABNORMAL HIGH (ref 70–99)
Glucose, Bld: 162 mg/dL — ABNORMAL HIGH (ref 70–99)
Glucose, Bld: 197 mg/dL — ABNORMAL HIGH (ref 70–99)
Potassium: 3.9 mmol/L (ref 3.5–5.1)
Potassium: 3.5 mmol/L (ref 3.5–5.1)
Potassium: 4.2 mmol/L (ref 3.5–5.1)
Sodium: 151 mmol/L — ABNORMAL HIGH (ref 135–145)
Sodium: 152 mmol/L — ABNORMAL HIGH (ref 135–145)
Sodium: 153 mmol/L — ABNORMAL HIGH (ref 135–145)

## 2023-07-11 LAB — CBC
HCT: 25.6 % — ABNORMAL LOW (ref 39.0–52.0)
Hemoglobin: 7.3 g/dL — ABNORMAL LOW (ref 13.0–17.0)
MCH: 29.4 pg (ref 26.0–34.0)
MCHC: 28.5 g/dL — ABNORMAL LOW (ref 30.0–36.0)
MCV: 103.2 fL — ABNORMAL HIGH (ref 80.0–100.0)
Platelets: 158 10*3/uL (ref 150–400)
RBC: 2.48 MIL/uL — ABNORMAL LOW (ref 4.22–5.81)
RDW: 20.1 % — ABNORMAL HIGH (ref 11.5–15.5)
WBC: 9.7 10*3/uL (ref 4.0–10.5)
nRBC: 0.7 % — ABNORMAL HIGH (ref 0.0–0.2)

## 2023-07-11 LAB — ECHOCARDIOGRAM COMPLETE
AR max vel: 3.38 cm2
AV Peak grad: 6.5 mmHg
Ao pk vel: 1.27 m/s
Area-P 1/2: 4.04 cm2
Est EF: 50
Height: 66 in
MV VTI: 2.83 cm2
S' Lateral: 4.7 cm
Weight: 2800.72 [oz_av]

## 2023-07-11 LAB — PHOSPHORUS
Phosphorus: 4.8 mg/dL — ABNORMAL HIGH (ref 2.5–4.6)
Phosphorus: 5 mg/dL — ABNORMAL HIGH (ref 2.5–4.6)

## 2023-07-11 LAB — LACTIC ACID, PLASMA: Lactic Acid, Venous: 1.6 mmol/L (ref 0.5–1.9)

## 2023-07-11 LAB — HEMOGLOBIN A1C
Hgb A1c MFr Bld: 7.9 % — ABNORMAL HIGH (ref 4.8–5.6)
Mean Plasma Glucose: 180.03 mg/dL

## 2023-07-11 LAB — AMMONIA: Ammonia: 26 umol/L (ref 9–35)

## 2023-07-11 LAB — MAGNESIUM: Magnesium: 2.8 mg/dL — ABNORMAL HIGH (ref 1.7–2.4)

## 2023-07-11 LAB — T4, FREE: Free T4: 0.79 ng/dL (ref 0.61–1.12)

## 2023-07-11 MED ORDER — OSMOLITE 1.5 CAL PO LIQD
1000.0000 mL | ORAL | Status: DC
  Administered 2023-07-11 – 2023-07-14 (×4): 1000 mL
  Filled 2023-07-11: qty 1000

## 2023-07-11 MED ORDER — DEXTROSE 5 % IV SOLN
1.2500 g | Freq: Three times a day (TID) | INTRAVENOUS | Status: DC
  Administered 2023-07-11 – 2023-07-12 (×4): 1.25 g via INTRAVENOUS
  Filled 2023-07-11 (×6): qty 6

## 2023-07-11 MED ORDER — FREE WATER
200.0000 mL | Status: DC
  Administered 2023-07-11 – 2023-07-12 (×6): 200 mL

## 2023-07-11 MED ORDER — JUVEN PO PACK
1.0000 | PACK | Freq: Two times a day (BID) | ORAL | Status: DC
  Administered 2023-07-11 – 2023-07-30 (×38): 1
  Filled 2023-07-11 (×38): qty 1

## 2023-07-11 MED ORDER — FUROSEMIDE 10 MG/ML IJ SOLN
40.0000 mg | Freq: Four times a day (QID) | INTRAMUSCULAR | Status: AC
  Administered 2023-07-11 (×3): 40 mg via INTRAVENOUS
  Filled 2023-07-11 (×3): qty 4

## 2023-07-11 MED ORDER — POTASSIUM CHLORIDE 20 MEQ PO PACK
40.0000 meq | PACK | Freq: Once | ORAL | Status: AC
  Administered 2023-07-11: 40 meq
  Filled 2023-07-11: qty 2

## 2023-07-11 MED ORDER — PANTOPRAZOLE SODIUM 40 MG IV SOLR
40.0000 mg | Freq: Every day | INTRAVENOUS | Status: DC
  Administered 2023-07-11 – 2023-07-22 (×12): 40 mg via INTRAVENOUS
  Filled 2023-07-11 (×12): qty 10

## 2023-07-11 MED ORDER — PROSOURCE TF20 ENFIT COMPATIBL EN LIQD
60.0000 mL | Freq: Every day | ENTERAL | Status: DC
  Administered 2023-07-11 – 2023-07-15 (×5): 60 mL
  Filled 2023-07-11 (×5): qty 60

## 2023-07-11 MED ORDER — CHLORHEXIDINE GLUCONATE CLOTH 2 % EX PADS
6.0000 | MEDICATED_PAD | Freq: Every day | CUTANEOUS | Status: AC
  Administered 2023-07-11 – 2023-07-15 (×5): 6 via TOPICAL

## 2023-07-11 MED ORDER — MUPIROCIN 2 % EX OINT
1.0000 | TOPICAL_OINTMENT | Freq: Two times a day (BID) | CUTANEOUS | Status: AC
  Administered 2023-07-11 – 2023-07-15 (×10): 1 via NASAL
  Filled 2023-07-11: qty 22

## 2023-07-11 NOTE — Progress Notes (Signed)
 HD#1 SUBJECTIVE:  Patient Summary: Albert Vance is a 78 year old male with history of stroke, CAD, hypertension, A-fib, CKD stage III, thyroid disease, and type 2 diabetes presenting to the ED from [facility] for worsening kidney function.  Overnight Events: No acute events overnight  Interim History: Patient was evaluated at bedside with wife nearby.  Per patient's wife, patient is slightly more alert and was able to recognize her this morning.  Discussed plan for the day with the patient's wife including likely palliative consult and continued treatment plan.  OBJECTIVE:  Vital Signs: Vitals:   07/11/23 0752 07/11/23 0800 07/11/23 0815 07/11/23 0924  BP: (!) 121/56 123/64  (!) 122/58  Pulse:  (!) 57 60   Resp:  20 20   Temp:      TempSrc:      SpO2:  99% 99%   Weight:      Height:       Supplemental O2:  Ventilator SpO2: 99 % FiO2 (%): 40 %  Filed Weights   07/10/23 1742  Weight: 79.4 kg     Intake/Output Summary (Last 24 hours) at 07/11/2023 1014 Last data filed at 07/11/2023 0800 Gross per 24 hour  Intake 2723.75 ml  Output 1475 ml  Net 1248.75 ml   Net IO Since Admission: 1,248.75 mL [07/11/23 1014]  Physical Exam: Physical Exam Constitutional:      Appearance: He is ill-appearing.  HENT:     Head:     Comments: Trach in place Cardiovascular:     Rate and Rhythm: Normal rate and regular rhythm.  Pulmonary:     Effort: Pulmonary effort is normal.     Breath sounds: Normal breath sounds.  Abdominal:     General: Abdomen is flat. Bowel sounds are normal.     Palpations: Abdomen is soft.  Musculoskeletal:     Right lower leg: Edema present.     Left lower leg: Edema present.  Psychiatric:        Attention and Perception: He is inattentive.        Speech: He is noncommunicative.    CBC    Component Value Date/Time   WBC 9.2 07/10/2023 1156   RBC 2.18 (L) 07/10/2023 1247   RBC 2.20 (L) 07/10/2023 1156   HGB 7.6 (L) 07/10/2023 1832   HCT 25.6 (L)  07/10/2023 1832   PLT 162 07/10/2023 1156   MCV 105.0 (H) 07/10/2023 1156   MCH 30.0 07/10/2023 1156   MCHC 28.6 (L) 07/10/2023 1156   RDW 18.7 (H) 07/10/2023 1156   LYMPHSABS 0.6 (L) 07/10/2023 1156   MONOABS 0.4 07/10/2023 1156   EOSABS 0.3 07/10/2023 1156   BASOSABS 0.1 07/10/2023 1156      Latest Ref Rng & Units 07/11/2023    6:04 AM 07/10/2023   11:32 PM 07/10/2023    6:32 PM  BMP  Glucose 70 - 99 mg/dL 161  096  045   BUN 8 - 23 mg/dL 409  811  914   Creatinine 0.61 - 1.24 mg/dL 7.82  9.56  2.13   Sodium 135 - 145 mmol/L 152  151  152   Potassium 3.5 - 5.1 mmol/L 3.5  3.9  4.1   Chloride 98 - 111 mmol/L 114  113  113   CO2 22 - 32 mmol/L 29  29  29    Calcium 8.9 - 10.3 mg/dL 7.4  7.7  7.8    ASSESSMENT/PLAN:  Assessment: Principal Problem:   Hypernatremia Active Problems:   AKI (  acute kidney injury) (HCC)   Ventilator dependent (HCC)   Symptomatic anemia   Anasarca   Pressure injury of skin of left heel  Albert Vance is a 78 year old male with history of stroke, CAD, hypertension, A-fib, CKD stage III, thyroid disease, and type 2 diabetes presenting to the ED from [facility] for worsening kidney function.  Plan: #Klebsiella Bacteremia  Blood cultures notable for Klebsiella species with carbapenem resistance.  He was started on vancomycin  and Avycaz . Remains afebrile with no leukocytosis. His PICC line was placed before coming in to the hospital, so this will be removed today.  - Continue Avycaz   - Remove PICC line  - Trend fever curve, CBC  #AKI secondary to hypervolemic hyponatremia #Uremia Sodium remains elevated at 152. Creatinine slightly improved to 2.28. Urea elevated at 103. He demonstrated some improvement after his Lasix . Will give additional boluses today and continue to monitor.  - IV Lasix  40 mg x 3 doses  - Trend renal function   #Macrocytic anemia Initial hemoglobin of 6.6 improved after unit of blood to 7.6. Will continue to trend hemoglobin.  - Trend  CBC   #Pulmonary Edema vs Infiltrates #Anasarca PCCM following and continues to manage ventilator.  Procalcitonin remains elevated at 2.45.  BNP elevated at one 1637.6.  Remains volume overloaded despite Lasix  yesterday.  For the time being, we will continue to trend this  #Atrial Fibrillation  Remains hemodynamically stable with normal heart rate. - Continue cardiac monitoring  #T2DM  Continue SSI.   #Goals of Care Remains full code.  Discussed plan for palliative care consult with patient's wife today, and she is amenable.  For the time being, we will continue full scope of care. - Follow-up palliative recs  Best Practice: Diet: N.p.o. VTE: Place and maintain sequential compression device Start: 07/10/23 1641 SCDs Start: 07/10/23 1601 Code: Full AB: Avycaz  Family Contact: Wife, to be notified. DISPO: Anticipated discharge  TBD  to  TBD  pending IV antibiotics.  Signature: Maxie Spaniel, MD Internal Medicine Resident, PGY-1 [facility]  Pager: [phone]  Please contact the on call pager after 5 pm and on weekends at [phone].

## 2023-07-11 NOTE — Progress Notes (Signed)
 Initial Nutrition Assessment  DOCUMENTATION CODES:  Not applicable  INTERVENTION:  TF via PEG: Osmolite 1.5 at 50ml/hr (1200ml per day) *start at 20ml and advance by 10ml q6h to goal rate 60ml ProSource TF20 once Provides 1880 kcal, 95g protein and free water daily  Free water flushes 200ml q4h (1200ml daily) Total free water (TF+ FWF)- 2114ml daily  Juven BID per tube to support wound healing  NUTRITION DIAGNOSIS:  Increased nutrient needs related to acute illness, wound healing as evidenced by estimated needs.  GOAL:  Patient will meet greater than or equal to 90% of their needs  MONITOR:  Vent status, Labs, Weight trends, I & O's, Skin, TF tolerance  REASON FOR ASSESSMENT:  Ventilator, Consult Enteral/tube feeding initiation and management, Wound healing  ASSESSMENT:  Pt admitted from [facility] with abnormal labs (elevated Cr and low hgb). PMH significant for SDH ( s/p mini crani/evac 12/2022 at [facility] c/b cerebellar hemorrhage and seizures), multiple aspiration PNA, chronic respiratory failure requiring trach/PEG, DM2, HTN, CAD, GERD, afib, anemia of chronic illness.  Patient is currently intubated on ventilator support via trach.  MV: 14.9 L/min Temp (24hrs), Avg:98.4 F (36.9 C), Min:97.2 F (36.2 C), Max:99.2 F (37.3 C)  Spoke with pt's wife at bedside. She reports that pt has continued to remained strictly reliant on TF via PEG tube. At [facility], they were providing mouth rinses and water swabs for moisture. She is uncertain of the tube feeding regimen he was on previously. Review of home meds reflects pt receiving Glucerna 1.5 at 60ml/hr. She is amenable to any adjustments as needed to help support wound healing.   Pt has remained primarily bed bound since admission to [facility] last year.   Drains/lines: UOP: x24 hours PICC line PEG  Admit weight: 79.4 kg Limited weight history on file.  Reviewed care everywhere  Weight at [facility] on 11/17 was 80.2  kg  Medications: lasix  40mg  q6h, SSI 0-15 units q4h, protonix   Labs:  Sodium 152 BUN 103 Cr 2.28 Calcium 7.4 (no albumin to correct) Phos 4.8 GFR 28 CBG's 147-271 x24 hours HgbA1c 7.9% (5/2)  NUTRITION - FOCUSED PHYSICAL EXAM:  Flowsheet Row Most Recent Value  Orbital Region Mild depletion  Upper Arm Region Unable to assess  [LUE moderate edema,  RUE moderate fat deficits]  Thoracic and Lumbar Region Unable to assess  [pitting edema]  Buccal Region Moderate depletion  Temple Region No depletion  Clavicle Bone Region No depletion  Clavicle and Acromion Bone Region No depletion  Scapular Bone Region Moderate depletion  Dorsal Hand Unable to assess  [edema]  Patellar Region Unable to assess  [deep pitting edema]  Anterior Thigh Region Unable to assess  Posterior Calf Region Unable to assess  Edema (RD Assessment) Severe  [BLE,]  Hair Reviewed  Eyes Reviewed  Mouth Other (Comment)  [edentulous]  Skin Reviewed  Nails Reviewed   Diet Order:   Diet Order             Diet NPO time specified  Diet effective now                   EDUCATION NEEDS:  Education needs have been addressed  Skin:  Skin Assessment: Skin Integrity Issues: Skin Integrity Issues:: Stage II, Unstageable, Stage III Stage II: bilateral buttocks Stage III: L ankle Unstageable: L heel  Last BM:  unknown/PTA  Height:  Ht Readings from Last 1 Encounters:  07/10/23 5\' 6"  (1.676 m)    Weight:  Wt  Readings from Last 1 Encounters:  07/10/23 79.4 kg    Ideal Body Weight:  64.5 kg  BMI:  Body mass index is 28.25 kg/m.  Estimated Nutritional Needs:   Kcal:  1700-1900  Protein:  90-105g  Fluid:  >/=1.7L  Rocklin Chute, RDN, LDN Clinical Nutrition See AMiON for contact information.

## 2023-07-11 NOTE — Progress Notes (Addendum)
 PHARMACY - PHYSICIAN COMMUNICATION CRITICAL VALUE ALERT - BLOOD CULTURE IDENTIFICATION (BCID)  Albert Vance. is an 78 y.o. male who presented to [facility] on 07/10/2023 from [facility] with a chief complaint of AKI and shock. Patient has PMH of SDH s/p bur holes on chronic vent/trach/PEG since 12/2022. Prior admission to [facility] on 05/16/23 for acute on chronic respiratory failure. Patient has chronic foley catheter, exchanged in ED on 5/1.  Assessment:  4 of 4 bottles of carbapenem resistant Klebsiella pneumoniae, possible respiratory or urinary source   Name of physician (or Provider) Contacted: Dr. Dione Franks   Current antibiotics: Vancomycin  variable dosing  Zosyn  3.375mg  IV q8h   Changes to prescribed antibiotics recommended:  Start Ceftazidime Darnell Elbe 1.25g Q8H Discontinue Vancomycin  and Zosyn    Recommendations accepted by provider  Results for orders placed or performed during the hospital encounter of 07/10/23  Blood Culture ID Panel (Reflexed) (Collected: 07/10/2023  6:32 PM)  Result Value Ref Range   Enterococcus faecalis NOT DETECTED NOT DETECTED   Enterococcus Faecium NOT DETECTED NOT DETECTED   Listeria monocytogenes NOT DETECTED NOT DETECTED   Staphylococcus species NOT DETECTED NOT DETECTED   Staphylococcus aureus (BCID) NOT DETECTED NOT DETECTED   Staphylococcus epidermidis NOT DETECTED NOT DETECTED   Staphylococcus lugdunensis NOT DETECTED NOT DETECTED   Streptococcus species NOT DETECTED NOT DETECTED   Streptococcus agalactiae NOT DETECTED NOT DETECTED   Streptococcus pneumoniae NOT DETECTED NOT DETECTED   Streptococcus pyogenes NOT DETECTED NOT DETECTED   A.calcoaceticus-baumannii NOT DETECTED NOT DETECTED   Bacteroides fragilis NOT DETECTED NOT DETECTED   Enterobacterales DETECTED (A) NOT DETECTED   Enterobacter cloacae complex NOT DETECTED NOT DETECTED   Escherichia coli NOT DETECTED NOT DETECTED   Klebsiella aerogenes NOT DETECTED NOT DETECTED    Klebsiella oxytoca NOT DETECTED NOT DETECTED   Klebsiella pneumoniae DETECTED (A) NOT DETECTED   Proteus species NOT DETECTED NOT DETECTED   Salmonella species NOT DETECTED NOT DETECTED   Serratia marcescens NOT DETECTED NOT DETECTED   Haemophilus influenzae NOT DETECTED NOT DETECTED   Neisseria meningitidis NOT DETECTED NOT DETECTED   Pseudomonas aeruginosa NOT DETECTED NOT DETECTED   Stenotrophomonas maltophilia NOT DETECTED NOT DETECTED   Candida albicans NOT DETECTED NOT DETECTED   Candida auris NOT DETECTED NOT DETECTED   Candida glabrata NOT DETECTED NOT DETECTED   Candida krusei NOT DETECTED NOT DETECTED   Candida parapsilosis NOT DETECTED NOT DETECTED   Candida tropicalis NOT DETECTED NOT DETECTED   Cryptococcus neoformans/gattii NOT DETECTED NOT DETECTED   CTX-M ESBL NOT DETECTED NOT DETECTED   Carbapenem resistance IMP NOT DETECTED NOT DETECTED   Carbapenem resistance KPC DETECTED (A) NOT DETECTED   Carbapenem resistance NDM NOT DETECTED NOT DETECTED   Carbapenem resist OXA 48 LIKE NOT DETECTED NOT DETECTED   Carbapenem resistance VIM NOT DETECTED NOT DETECTED    Kirk Basquez 07/11/2023  7:16 AM

## 2023-07-11 NOTE — TOC Initial Note (Addendum)
 Transition of Care (TOC) - Initial/Assessment Note    Patient Details  Name: Albert Vance. MRN: [id number] Date of Birth: 27-Jun-1945  Transition of Care [facility]) CM/SW Contact:    Jeffory Mings, [place] Phone Number: 07/11/2023, 3:10 PM  Clinical Narrative: Pt admitted from [facility]. Pt's wife Isa Manuel [phone] reports she has been in contact with [facility] in [place] re transfer to their facility from [facility] and is under the impression they have bed availability. Voicemail left for Landa Pine in [facility] admissions [phone] requesting return call.   Per Angie at [facility], pt's Medicare SNF benefits are good through 5/16 and pt can return to [facility] under those benefits if needed but [place] auth for SNF has been received if [facility] has a bed. Angie to email VA auth details to [place] [facility] [place]. [facility] not able to accept pt back over weekend. Will provide updates as available.   Paullette Boston, MSW, LCSW [phone] (coverage)               Expected Discharge Plan: Skilled Nursing Facility Barriers to Discharge: Continued Medical Work up, SNF Pending bed offer   Patient Goals and CMS Choice   CMS Medicare.gov Compare Post Acute Care list provided to:: Other (Comment Required) (spouse) Choice offered to / list presented to : Spouse [place] ownership interest in [facility].provided to:: Spouse    Expected Discharge Plan and Services     Post Acute Care Choice: Skilled Nursing Facility Living arrangements for the past 2 months:  (admitted from [facility])                                      Prior Living Arrangements/Services Living arrangements for the past 2 months:  (admitted from [facility]) Lives with:: Facility Resident Patient language and need for interpreter reviewed:: Yes        Need for Family Participation in Patient Care: Yes (Comment) Care giver support system in place?: Yes (comment)   Criminal Activity/Legal  Involvement Pertinent to Current Situation/Hospitalization: No - Comment as needed  Activities of Daily Living      Permission Sought/Granted Permission sought to share information with : Oceanographer granted to share information with : Yes, Verbal Permission Granted              Emotional Assessment       Orientation: :  (UTA) Alcohol  / Substance Use: Not Applicable Psych Involvement: No (comment)  Admission diagnosis:  Anasarca [R60.1] Hypernatremia [E87.0] Hypoalbuminemia [E88.09] 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] AKI (acute kidney injury) (HCC) [N17.9] Symptomatic anemia [D64.9] Patient Active Problem List   Diagnosis Date Noted   Hypoalbuminemia 07/11/2023   Klebsiella sepsis (HCC) 07/11/2023   Hypernatremia 07/10/2023   AKI (acute kidney injury) (HCC) 07/10/2023   Ventilator dependent (HCC) 07/10/2023   Symptomatic anemia 07/10/2023   Anasarca 07/10/2023   Pressure injury of skin of left heel 07/10/2023   PCP:  Jerral Moos, MD Pharmacy:  No Pharmacies Listed    Social Drivers of Health (SDOH) Social History: SDOH Screenings   Transportation Needs: Unknown (01/01/2023)   Received from [facility]  Tobacco Use: Medium Risk (12/31/2022)   Received from [facility]   SDOH Interventions:     Readmission Risk Interventions     No data to display

## 2023-07-11 NOTE — Progress Notes (Signed)
 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 Dementia, Afib (off NOAC), PCM, and anemia of chronic illness. He has been a resident of [facility] since 01/2023 when he was dc from [facility] for SDH/sequelae of. He presented to [facility] ED 5/1 from [facility] for further evaluation of abnormal labs -- noted to have elevated Cr and low hgb. Foley exchanged. Given one unit of PRBC and 1 L NS 0.9% bolus.   On stable vent settings (PRVC 20/510/+5/40%) and without trach complications.  PCCM consulted for evaluation in this setting    Pertinent  Medical History  SDH, seizures (weaned off keppra), multiple aspiration PNA, failed extubation, chronic resp failure requiring trach/PEG, PTSD, DM-2, HTN, CAD, GERD, ? Dementia, Afib (off NOAC), PCM, and anemia of chronic illness Significant Hospital Events: Including procedures, antibiotic start and stop dates in addition to other pertinent events   5/1 1 PRBC for hgb 6.6. PCCM consult  5/1 foley exchanged in the ED here. 5/2 cultures positive for klebsiella with CRE  Interim History / Subjective:  No overnight issues.  Wife at bedside Objective   Blood pressure 123/64, pulse 60, temperature 97.9 F (36.6 C), temperature source Axillary, resp. rate 20, height 5\' 6"  (1.676 m), weight 79.4 kg, SpO2 99%.    Vent Mode: PRVC FiO2 (%):  [40 %] 40 % Set Rate:  [20 bmp] 20 bmp Vt Set:  [510 mL] 510 mL PEEP:  [5 cmH20] 5 cmH20 Plateau Pressure:  [20 cmH20-25 cmH20] 20 cmH20   Intake/Output Summary (Last 24 hours) at 07/11/2023 0901 Last data filed at 07/11/2023 0800 Gross per 24  hour  Intake 2723.75 ml  Output 1475 ml  Net 1248.75 ml   Filed Weights   07/10/23 1742  Weight: 79.4 kg    Examination: Gen:       chronically ill appearing HEENT:  trach to vent 7.0 cuffed portex Lungs:    sounds of mechanical ventilation auscultated breath sounds diminished bilateral bases, no wheeze CV:         irregular, HR in 60-80s Abd:      + bowel sounds; soft, non-tender; no palpable masses, no distension Ext:   anasarca Skin:      pale, no rashes Neuro:  not sedated, opens eyes and tracks but does not follow commands   Labs personally reviewed - blood  cx positive for klebsiella CRE K 3.5 Na 152  Resolved Hospital Problem list     Assessment & Plan:  Chronic respiratory failure Trach/vent dependence  -CXR w some bilat opacities- possible pulm edema, possible pna   Plan -Stable for TRH to admit & PCCM to follow for trach/vent  -cont MV support, routine trach care. 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 Strict I/O -Renal U/S unremarkable  SDH (s/p mini crani/evac 12/2022 at [facility] c/b R cerebellar hemorrhage and seizures) and metabolic encephalopathy, ? Dementia -delirium precautions  -minimize CNS depressing meds  -TFT -Ammonia level  DM-2 -HbA1c -Glycemic control  HTN, CAD, s/p CABG, S-CHD (EF at [facility] 44%)   Afib (off NOAC) -Monitor BP and HR -Echo -IV Lasix   GERD -PPI for now  Severe PCM -Dietitian consult  Acute anemia on chronic anemia of chronic illness: abd wall hematomas  -Am  labs -SCD -Blood Tx for Hb>7  Hypokalemia - replace/repeat labs Best Practice (right click and "Reselect all SmartList Selections" daily)   Per primary. PCCM will follow going forward for vent management.  Louie Rover, MD Pulmonary and Critical Care Medicine [facility] 07/11/2023 9:02 AM Pager: see AMION  If no response to pager, please call critical care on call (see AMION) until 7pm After 7:00 pm call Elink

## 2023-07-11 NOTE — Progress Notes (Addendum)
 Pharmacy Antibiotic Note  Albert Vance. is a 78 y.o. male admitted on 07/10/2023 with from [facility] with a chief complaint of AKI and shock. Patient has PMH of CKD3, SDH s/p bur holes on chronic vent/trach/PEG since 12/2022. Prior admission to [facility] on 05/16/23 for acute on chronic respiratory failure. Patient has chronic foley catheter, exchanged in ED on 5/1. Pharmacy was previously consulted for Vancomycin  and Zosyn  dosing for empiric shock. Given AM BCID results - 4 of 4 bottles KPC klebsiella pneumoniae bacteremia, now escalating therapy. Possible urinary source.  WBC 9.2, Tmax 99.2, Scr 2.28 (BL ~1.2)  Plan: BCID results were discussed with Dr. Dione Franks and the patient's antibiotics will be changed to Avycaz  1.25g q8h. Further narrowing will be determined based on finalized susceptibilities. Discontinue vancomycin  and zosyn   Monitor renal function  F/u sensitivities, clinical status    Height: 5\' 6"  (167.6 cm) Weight: 79.4 kg (175 lb 0.7 oz) IBW/kg (Calculated) : 63.8  Temp (24hrs), Avg:98.4 F (36.9 C), Min:97.2 F (36.2 C), Max:99.2 F (37.3 C)  Recent Labs  Lab 07/10/23 1156 07/10/23 1832 07/10/23 2047 07/10/23 2332 07/11/23 0604  WBC 9.2  --   --   --   --   CREATININE 2.53* 2.47*  --  2.34* 2.28*  LATICACIDVEN  --   --  1.1 1.6  --     Estimated Creatinine Clearance: 26 mL/min (A) (by C-G formula based on SCr of 2.28 mg/dL (H)).    Allergies  Allergen Reactions   Empagliflozin Other (See Comments)    Urinary tract infections   Codeine Other (See Comments)    Unknown    Gabapentin Other (See Comments)    Allergy not listed on MAR   Lisinopril Other (See Comments)    Unknown    Metformin And Related Other (See Comments)    Unknown     Antimicrobials this admission: Vancomycin  5/1 x1  Zosyn  5/1 > 5/2 Avycaz  5/2 >>   Microbiology results: 5/1 BCID: 4 of 4 bottles KPC Klebsiella pneumoniae 5/1 BCx: GNR 5/1 MRSA PCR: positive  Thank you for  allowing pharmacy to be a part of this patient's care.  Elwyn Lowden 07/11/2023 7:35 AM

## 2023-07-11 NOTE — Consult Note (Signed)
 [facility]  Total days of antibiotics 2       Reason for Consult: KPCR bacteremia     Referring Physician: paliwal  Principal Problem:   Hypernatremia Active Problems:   AKI (acute kidney injury) (HCC)   Ventilator dependent (HCC)   Symptomatic anemia   Anasarca   Pressure injury of skin of left heel   Hypoalbuminemia   Klebsiella sepsis (HCC)    HPI: Albert Shon. is a 78 y.o. 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 Debilitated and [facility]. Patient most recently had worsening edema per his wife noted to hands and feet. Also has new pressure ulcer to left heel. He was sent over due to worsening aki and anemia, denies any recent history of fevers. He was sent to [facility] for eval. Due to concern for uti, urine cx and blood cx were collected. His blood cx showing carbanem resistant kleb pneumonaie. His wife does not recall if he has had MDRO infection recently.  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   Anemia    Chronic kidney disease (CKD), stage III (moderate) (HCC)    Chronic pain syndrome    Coronary artery disease    Dependence on respirator (ventilator) status (HCC)    Diabetes mellitus without complication (HCC)    Disorder of thyroid    Dysphagia, oropharyngeal phase    Encephalopathy    Generalized anxiety disorder    GERD (gastroesophageal reflux disease)    Glaucoma in diseases classified elsewhere    Hemiplegia and hemiparesis following cerebral infarction affecting left non-dominant side (HCC)    Hyperkalemia    Hypertension    Insomnia    Muscle weakness (generalized)    Other sequelae of cerebral infarction    Pneumonitis due to inhalation of  food and vomit (HCC)    PTSD (post-traumatic stress disorder)    Renal disorder    Respiratory failure (HCC)    Stroke (HCC)    Traumatic subdural hemorrhage without loss of consciousness, sequela (HCC)     Allergies:  Allergies  Allergen Reactions   Empagliflozin Other (See Comments)    Urinary tract infections   Codeine Other (See Comments)    Unknown    Gabapentin Other (See Comments)    Allergy not listed on MAR   Lisinopril Other (See Comments)    Unknown    Metformin And Related Other (See Comments)    Unknown      MEDICATIONS:  amantadine   100 mg Per Tube Daily   amLODipine   10 mg Per Tube Daily   brimonidine   1 drop Both Eyes Q8H   Chlorhexidine  Gluconate Cloth  6 each Topical Daily   feeding supplement (PROSource TF20)  60 mL Per Tube Daily   free water  200 mL Per Tube Q4H   furosemide   40 mg Intravenous Q6H   insulin  aspart  0-15 Units Subcutaneous Q4H   ipratropium-albuterol   3 mL Nebulization Q6H   modafinil   100 mg Per Tube Daily   mupirocin  ointment  1 Application Nasal BID   nutrition supplement (JUVEN)  1 packet Per Tube BID BM   mouth rinse  15  mL Mouth Rinse Q2H   pantoprazole  (PROTONIX ) IV  40 mg Intravenous Daily   polyvinyl alcohol   1 drop Both Eyes Q4H    Social History   Tobacco Use   Smoking status: Unknown  Vaping Use   Vaping status: Unknown    History reviewed. No pertinent family history.  Review of Systems -  Denies fever or chills. + swelling of extremities. Denies pain.  OBJECTIVE: Temp:  [97.2 F (36.2 C)-99.2 F (37.3 C)] 98.1 F (36.7 C) (05/02 1515) Pulse Rate:  [50-73] 69 (05/02 1630) Resp:  [16-27] 22 (05/02 1630) BP: (65-172)/(36-129) 118/62 (05/02 1600) SpO2:  [88 %-100 %] 98 % (05/02 1630) FiO2 (%):  [40 %] 40 % (05/02 1519) Weight:  [79.4 kg] 79.4 kg (05/01 1742) Physical Exam  Constitutional: He is oriented to person, place, and time. He appears well-developed and well-nourished. No distress.  HENT:   Mouth/Throat: Oropharynx is clear and moist. No oropharyngeal exudate. S/p trach Cardiovascular: Normal rate, regular rhythm and normal heart sounds. Exam reveals no gallop and no friction rub.  No murmur heard.  Pulmonary/Chest: Effort normal and breath sounds normal. No respiratory distress. He has no wheezes.  Abdominal: Soft. Bowel sounds are normal. He exhibits no distension. There is no tenderness. Peg+ Lymphadenopathy:  He has no cervical adenopathy.  Neurological: He is alert and oriented to person, place, and time.  Skin: bilateral tma but left ankle pressure ulcer Psychiatric: He has a normal mood and affect. His behavior is normal.    LABS: Results for orders placed or performed during the hospital encounter of 07/10/23 (from the past 48 hours)  ABO/Rh     Status: None   Collection Time: 07/10/23 11:55 AM  Result Value Ref Range   ABO/RH(D)      A POS Performed at [facility], [address] [place], [place] [address]   CBC with Differential     Status: Abnormal   Collection Time: 07/10/23 11:56 AM  Result Value Ref Range   WBC 9.2 4.0 - 10.5 K/uL   RBC 2.20 (L) 4.22 - 5.81 MIL/uL   Hemoglobin 6.6 (LL) 13.0 - 17.0 g/dL    Comment: REPEATED TO VERIFY THIS CRITICAL RESULT HAS VERIFIED AND BEEN CALLED TO A. PAXTON, RN BY LEAH KLAR ON 05 01 2025 AT 1241, AND HAS BEEN READ BACK.     HCT 23.1 (L) 39.0 - 52.0 %   MCV 105.0 (H) 80.0 - 100.0 fL   MCH 30.0 26.0 - 34.0 pg   MCHC 28.6 (L) 30.0 - 36.0 g/dL   RDW 60.4 (H) 54.0 - 98.1 %   Platelets 162 150 - 400 K/uL    Comment: REPEATED TO VERIFY   nRBC 3.3 (H) 0.0 - 0.2 %   Neutrophils Relative % 84 %   Neutro Abs 7.8 (H) 1.7 - 7.7 K/uL   Lymphocytes Relative 6 %   Lymphs Abs 0.6 (L) 0.7 - 4.0 K/uL   Monocytes Relative 5 %   Monocytes Absolute 0.4 0.1 - 1.0 K/uL   Eosinophils Relative 3 %   Eosinophils Absolute 0.3 0.0 - 0.5 K/uL   Basophils Relative 1 %   Basophils Absolute 0.1 0.0 - 0.1 K/uL   Immature  Granulocytes 1 %   Abs Immature Granulocytes 0.06 0.00 - 0.07 K/uL    Comment: Performed at [facility], [address] [place], [place] [address]  Comprehensive metabolic panel     Status: Abnormal   Collection Time:  07/10/23 11:56 AM  Result Value Ref Range   Sodium 152 (H) 135 - 145 mmol/L   Potassium 4.4 3.5 - 5.1 mmol/L   Chloride 111 98 - 111 mmol/L   CO2 28 22 - 32 mmol/L   Glucose, Bld 269 (H) 70 - 99 mg/dL    Comment: Glucose reference range applies only to samples taken after fasting for at least 8 hours.   BUN 117 (H) 8 - 23 mg/dL   Creatinine, Ser 0.98 (H) 0.61 - 1.24 mg/dL   Calcium 7.8 (L) 8.9 - 10.3 mg/dL   Total Protein 6.7 6.5 - 8.1 g/dL   Albumin 1.7 (L) 3.5 - 5.0 g/dL   AST 18 15 - 41 U/L   ALT 23 0 - 44 U/L   Alkaline Phosphatase 231 (H) 38 - 126 U/L   Total Bilirubin 0.9 0.0 - 1.2 mg/dL   GFR, Estimated 25 (L) >60 mL/min    Comment: (NOTE) Calculated using the CKD-EPI Creatinine Equation (2021)    Anion gap 13 5 - 15    Comment: Performed at [facility], [address], [place], [place] [address]  Lipase, blood     Status: None   Collection Time: 07/10/23 11:56 AM  Result Value Ref Range   Lipase 32 11 - 51 U/L    Comment: Performed at [facility], [address], [place], [place] [address]  Urinalysis, Routine w reflex microscopic -Urine, Catheterized     Status: Abnormal   Collection Time: 07/10/23 12:38 PM  Result Value Ref Range   Color, Urine YELLOW YELLOW   APPearance CLOUDY (A) CLEAR   Specific Gravity, Urine 1.014 1.005 - 1.030   pH 7.0 5.0 - 8.0   Glucose, UA NEGATIVE NEGATIVE mg/dL   Hgb urine dipstick SMALL (A) NEGATIVE   Bilirubin Urine NEGATIVE NEGATIVE   Ketones, ur NEGATIVE NEGATIVE mg/dL   Protein, ur 30 (A) NEGATIVE mg/dL   Nitrite NEGATIVE NEGATIVE   Leukocytes,Ua LARGE (A) NEGATIVE   RBC / HPF 11-20 0 - 5 RBC/hpf   WBC, UA >50 0 - 5 WBC/hpf   Bacteria, UA FEW (A) NONE SEEN   Squamous Epithelial / HPF 0-5 0  - 5 /HPF   Mucus PRESENT    Budding Yeast PRESENT     Comment: Performed at [facility], [address], [place], [place] [address]  POC occult blood, ED Provider will collect     Status: None   Collection Time: 07/10/23 12:47 PM  Result Value Ref Range   Fecal Occult Bld NEGATIVE NEGATIVE  Reticulocytes     Status: Abnormal   Collection Time: 07/10/23 12:47 PM  Result Value Ref Range   Retic Ct Pct 2.9 0.4 - 3.1 %   RBC. 2.18 (L) 4.22 - 5.81 MIL/uL   Retic Count, Absolute 62.3 19.0 - 186.0 K/uL   Immature Retic Fract 56.4 (H) 2.3 - 15.9 %    Comment: Performed at [facility], [address], [place], [place] [address]  Folate     Status: None   Collection Time: 07/10/23  1:05 PM  Result Value Ref Range   Folate 21.4 >5.9 ng/mL    Comment: Performed at [facility], [address], [place], [place] [address]  Type and screen [facility]     Status: None (Preliminary result)   Collection Time: 07/10/23  1:05 PM  Result Value Ref Range   ABO/RH(D) A POS  Antibody Screen NEG    Sample Expiration 07/13/2023,2359    Unit Number [id number]    Blood Component Type RED CELLS,LR    Unit division 00    Status of Unit ALLOCATED    Transfusion Status OK TO TRANSFUSE    Crossmatch Result Compatible    Unit Number [id number]    Blood Component Type RED CELLS,LR    Unit division 00    Status of Unit ISSUED,FINAL    Transfusion Status OK TO TRANSFUSE    Crossmatch Result      Compatible Performed at [facility], [address] [place], [place] [address]   Prepare RBC (crossmatch)     Status: None   Collection Time: 07/10/23  1:05 PM  Result Value Ref Range   Order Confirmation      ORDER PROCESSED BY BLOOD BANK Performed at [facility], [address], [place], [place] [address]   Iron and TIBC     Status: Abnormal   Collection Time: 07/10/23  1:22 PM  Result Value Ref Range   Iron 50 45 - 182 ug/dL   TIBC 629 (L) 528 -  413 ug/dL   Saturation Ratios 31 17.9 - 39.5 %   UIBC 114 ug/dL    Comment: Performed at [facility], [address] [place], [place] [address]  Ferritin     Status: None   Collection Time: 07/10/23  1:22 PM  Result Value Ref Range   Ferritin 276 24 - 336 ng/mL    Comment: Performed at [facility], [address] [place], [place] [address]  Vitamin B12     Status: None   Collection Time: 07/10/23  1:30 PM  Result Value Ref Range   Vitamin B-12 876 180 - 914 pg/mL    Comment: (NOTE) This assay is not validated for testing neonatal or myeloproliferative syndrome specimens for Vitamin B12 levels. Performed at [facility] Lab, [address], [place], [place] [address]   Glucose, capillary     Status: Abnormal   Collection Time: 07/10/23  5:31 PM  Result Value Ref Range   Glucose-Capillary 264 (H) 70 - 99 mg/dL    Comment: Glucose reference range applies only to samples taken after fasting for at least 8 hours.  MRSA Next Gen by PCR, Nasal     Status: Abnormal   Collection Time: 07/10/23  5:39 PM   Specimen: Nasal Mucosa; Nasal Swab  Result Value Ref Range   MRSA by PCR Next Gen DETECTED (A) NOT DETECTED    Comment: RESULT CALLED TO, READ BACK BY AND VERIFIED WITH: [id number] AT 1938 ADC TO RN Lus Salter W. (NOTE) The GeneXpert MRSA Assay (FDA approved for NASAL specimens only), is one component of a comprehensive MRSA colonization surveillance program. It is not intended to diagnose MRSA infection nor to guide or monitor treatment for MRSA infections. Test performance is not FDA approved in patients less than 58 years old. Performed at [facility], [address] [place], [place] [address]   I-STAT 7, (LYTES, BLD GAS, ICA, H+H)     Status: Abnormal   Collection Time: 07/10/23  6:15 PM  Result Value Ref Range   pH, Arterial 7.520 (H) 7.35 - 7.45   pCO2 arterial 34.8 32 - 48 mmHg   pO2, Arterial 71 (L) 83 - 108 mmHg   Bicarbonate 28.3 (H) 20.0 - 28.0  mmol/L   TCO2 29 22 - 32 mmol/L  O2 Saturation 96 %   Acid-Base Excess 5.0 (H) 0.0 - 2.0 mmol/L   Sodium 155 (H) 135 - 145 mmol/L   Potassium 4.3 3.5 - 5.1 mmol/L   Calcium, Ion 1.06 (L) 1.15 - 1.40 mmol/L   HCT 22.0 (L) 39.0 - 52.0 %   Hemoglobin 7.5 (L) 13.0 - 17.0 g/dL   Patient temperature 14.7 F    Collection site RADIAL, ALLEN'S TEST ACCEPTABLE    Drawn by Operator    Sample type ARTERIAL   Hemoglobin and hematocrit, blood     Status: Abnormal   Collection Time: 07/10/23  6:32 PM  Result Value Ref Range   Hemoglobin 7.6 (L) 13.0 - 17.0 g/dL   HCT 82.9 (L) 56.2 - 13.0 %    Comment: Performed at [facility], [address] [place], [place] [address]  Brain natriuretic peptide     Status: Abnormal   Collection Time: 07/10/23  6:32 PM  Result Value Ref Range   B Natriuretic Peptide 1,637.6 (H) 0.0 - 100.0 pg/mL    Comment: Performed at [facility], [address], [place], [place] [address]  Culture, blood (Routine X 2) w Reflex to ID Panel     Status: None (Preliminary result)   Collection Time: 07/10/23  6:32 PM   Specimen: BLOOD  Result Value Ref Range   Specimen Description BLOOD SITE NOT SPECIFIED    Special Requests      BOTTLES DRAWN AEROBIC AND ANAEROBIC Blood Culture adequate volume   Culture  Setup Time      GRAM NEGATIVE RODS IN BOTH AEROBIC AND ANAEROBIC BOTTLES CRITICAL RESULT CALLED TO, READ BACK BY AND VERIFIED WITH: PHARMD V BRYK 07/11/2023 @ 0700 BY AB Performed at [facility], [address], [place], [place] [address]    Culture GRAM NEGATIVE RODS    Report Status PENDING   Culture, blood (Routine X 2) w Reflex to ID Panel     Status: None (Preliminary result)   Collection Time: 07/10/23  6:32 PM   Specimen: BLOOD  Result Value Ref Range   Specimen Description BLOOD SITE NOT SPECIFIED    Special Requests      BOTTLES DRAWN AEROBIC AND ANAEROBIC Blood Culture adequate volume   Culture  Setup Time      GRAM NEGATIVE RODS IN BOTH  AEROBIC AND ANAEROBIC BOTTLES CRITICAL VALUE NOTED.  VALUE IS CONSISTENT WITH PREVIOUSLY REPORTED AND CALLED VALUE. Performed at [facility], [address], [place], [place] [address]    Culture GRAM NEGATIVE RODS    Report Status PENDING   Magnesium     Status: Abnormal   Collection Time: 07/10/23  6:32 PM  Result Value Ref Range   Magnesium 3.2 (H) 1.7 - 2.4 mg/dL    Comment: Performed at [facility], [address], [place], [place] [address]  TSH     Status: None   Collection Time: 07/10/23  6:32 PM  Result Value Ref Range   TSH 0.633 0.350 - 4.500 uIU/mL    Comment: Performed by a 3rd Generation assay with a functional sensitivity of <=0.01 uIU/mL. Performed at [facility], [address] [place], [place] [address]   Basic metabolic panel     Status: Abnormal   Collection Time: 07/10/23  6:32 PM  Result Value Ref Range   Sodium 152 (H) 135 - 145 mmol/L   Potassium 4.1 3.5 - 5.1 mmol/L   Chloride 113 (  H) 98 - 111 mmol/L   CO2 29 22 - 32 mmol/L   Glucose, Bld 294 (H) 70 - 99 mg/dL    Comment: Glucose reference range applies only to samples taken after fasting for at least 8 hours.   BUN 112 (H) 8 - 23 mg/dL   Creatinine, Ser 0.98 (H) 0.61 - 1.24 mg/dL   Calcium 7.8 (L) 8.9 - 10.3 mg/dL   GFR, Estimated 26 (L) >60 mL/min    Comment: (NOTE) Calculated using the CKD-EPI Creatinine Equation (2021)    Anion gap 10 5 - 15    Comment: Performed at [facility], [address], [place], [place] [address]  Blood Culture ID Panel (Reflexed)     Status: Abnormal   Collection Time: 07/10/23  6:32 PM  Result Value Ref Range   Enterococcus faecalis NOT DETECTED NOT DETECTED   Enterococcus Faecium NOT DETECTED NOT DETECTED   Listeria monocytogenes NOT DETECTED NOT DETECTED   Staphylococcus species NOT DETECTED NOT DETECTED   Staphylococcus aureus (BCID) NOT DETECTED NOT DETECTED   Staphylococcus epidermidis NOT DETECTED NOT DETECTED   Staphylococcus  lugdunensis NOT DETECTED NOT DETECTED   Streptococcus species NOT DETECTED NOT DETECTED   Streptococcus agalactiae NOT DETECTED NOT DETECTED   Streptococcus pneumoniae NOT DETECTED NOT DETECTED   Streptococcus pyogenes NOT DETECTED NOT DETECTED   A.calcoaceticus-baumannii NOT DETECTED NOT DETECTED   Bacteroides fragilis NOT DETECTED NOT DETECTED   Enterobacterales DETECTED (A) NOT DETECTED    Comment: Enterobacterales represent a large order of gram negative bacteria, not a single organism. CRITICAL RESULT CALLED TO, READ BACK BY AND VERIFIED WITH: PHARMD V BRYK 07/11/2023 @ 0700 BY AB    Enterobacter cloacae complex NOT DETECTED NOT DETECTED   Escherichia coli NOT DETECTED NOT DETECTED   Klebsiella aerogenes NOT DETECTED NOT DETECTED   Klebsiella oxytoca NOT DETECTED NOT DETECTED   Klebsiella pneumoniae DETECTED (A) NOT DETECTED    Comment: CRITICAL RESULT CALLED TO, READ BACK BY AND VERIFIED WITH: PHARMD V BRYK 07/11/2023 @ 0700 BY AB    Proteus species NOT DETECTED NOT DETECTED   Salmonella species NOT DETECTED NOT DETECTED   Serratia marcescens NOT DETECTED NOT DETECTED   Haemophilus influenzae NOT DETECTED NOT DETECTED   Neisseria meningitidis NOT DETECTED NOT DETECTED   Pseudomonas aeruginosa NOT DETECTED NOT DETECTED   Stenotrophomonas maltophilia NOT DETECTED NOT DETECTED   Candida albicans NOT DETECTED NOT DETECTED   Candida auris NOT DETECTED NOT DETECTED   Candida glabrata NOT DETECTED NOT DETECTED   Candida krusei NOT DETECTED NOT DETECTED   Candida parapsilosis NOT DETECTED NOT DETECTED   Candida tropicalis NOT DETECTED NOT DETECTED   Cryptococcus neoformans/gattii NOT DETECTED NOT DETECTED   CTX-M ESBL NOT DETECTED NOT DETECTED   Carbapenem resistance IMP NOT DETECTED NOT DETECTED   Carbapenem resistance KPC DETECTED (A) NOT DETECTED    Comment: CRITICAL RESULT CALLED TO, READ BACK BY AND VERIFIED WITH: PHARMD V BRYK 07/11/2023 @ 0700 BY AB (NOTE) Carbapenemase  resistant enterobacterales detected. Recommend ceftazidime /avibactam as intitial therapy.     Carbapenem resistance NDM NOT DETECTED NOT DETECTED   Carbapenem resist OXA 48 LIKE NOT DETECTED NOT DETECTED   Carbapenem resistance VIM NOT DETECTED NOT DETECTED    Comment: Performed at [facility], [address] [place], [place] [address]  Procalcitonin     Status: None   Collection Time: 07/10/23  6:39 PM  Result Value Ref Range   Procalcitonin 2.45 ng/mL  Comment:        Interpretation: PCT > 2 ng/mL: Systemic infection (sepsis) is likely, unless other causes are known. (NOTE)       Sepsis PCT Algorithm           Lower Respiratory Tract                                      Infection PCT Algorithm    ----------------------------     ----------------------------         PCT < 0.25 ng/mL                PCT < 0.10 ng/mL          Strongly encourage             Strongly discourage   discontinuation of antibiotics    initiation of antibiotics    ----------------------------     -----------------------------       PCT 0.25 - 0.50 ng/mL            PCT 0.10 - 0.25 ng/mL               OR       >80% decrease in PCT            Discourage initiation of                                            antibiotics      Encourage discontinuation           of antibiotics    ----------------------------     -----------------------------         PCT >= 0.50 ng/mL              PCT 0.26 - 0.50 ng/mL               AND       <80% decrease in PCT              Encourage initiation of                                             antibiotics       Encourage continuation           of antibiotics    ----------------------------     -----------------------------        PCT >= 0.50 ng/mL                  PCT > 0.50 ng/mL               AND         increase in PCT                  Strongly encourage                                      initiation of antibiotics    Strongly encourage escalation            of antibiotics                                     -----------------------------  PCT <= 0.25 ng/mL                                                 OR                                        > 80% decrease in PCT                                      Discontinue / Do not initiate                                             antibiotics  Performed at [facility], [address], [place], [place] [address]   Glucose, capillary     Status: Abnormal   Collection Time: 07/10/23  7:27 PM  Result Value Ref Range   Glucose-Capillary 271 (H) 70 - 99 mg/dL    Comment: Glucose reference range applies only to samples taken after fasting for at least 8 hours.  Lactic acid, plasma     Status: None   Collection Time: 07/10/23  8:47 PM  Result Value Ref Range   Lactic Acid, Venous 1.1 0.5 - 1.9 mmol/L    Comment: Performed at [facility] Lab, [address], [place], [place] [address]  Basic metabolic panel     Status: Abnormal   Collection Time: 07/10/23 11:32 PM  Result Value Ref Range   Sodium 151 (H) 135 - 145 mmol/L   Potassium 3.9 3.5 - 5.1 mmol/L   Chloride 113 (H) 98 - 111 mmol/L   CO2 29 22 - 32 mmol/L   Glucose, Bld 205 (H) 70 - 99 mg/dL    Comment: Glucose reference range applies only to samples taken after fasting for at least 8 hours.   BUN 110 (H) 8 - 23 mg/dL   Creatinine, Ser 3.08 (H) 0.61 - 1.24 mg/dL   Calcium 7.7 (L) 8.9 - 10.3 mg/dL   GFR, Estimated 28 (L) >60 mL/min    Comment: (NOTE) Calculated using the CKD-EPI Creatinine Equation (2021)    Anion gap 9 5 - 15    Comment: Performed at [facility] Lab, [address], [place], [place] [address]  Lactic acid, plasma     Status: None   Collection Time: 07/10/23 11:32 PM  Result Value Ref Range   Lactic Acid, Venous 1.6 0.5 - 1.9 mmol/L    Comment: Performed at [facility], [address], [place], [place] [address]  Glucose, capillary     Status: Abnormal    Collection Time: 07/10/23 11:38 PM  Result Value Ref Range   Glucose-Capillary 192 (H) 70 - 99 mg/dL    Comment: Glucose reference range applies only to samples taken after fasting for at least 8 hours.  Glucose, capillary     Status: Abnormal   Collection Time: 07/11/23  3:14 AM  Result Value Ref Range   Glucose-Capillary 149 (H) 70 - 99 mg/dL    Comment: Glucose reference range applies only to samples taken  after fasting for at least 8 hours.  T4, free     Status: None   Collection Time: 07/11/23  6:04 AM  Result Value Ref Range   Free T4 0.79 0.61 - 1.12 ng/dL    Comment: (NOTE) Biotin ingestion may interfere with free T4 tests. If the results are inconsistent with the TSH level, previous test results, or the clinical presentation, then consider biotin interference. If needed, order repeat testing after stopping biotin. Performed at [facility] Lab, [address] [place], [place] [address]   Hemoglobin A1c     Status: Abnormal   Collection Time: 07/11/23  6:04 AM  Result Value Ref Range   Hgb A1c MFr Bld 7.9 (H) 4.8 - 5.6 %    Comment: (NOTE) Pre diabetes:          5.7%-6.4%  Diabetes:              >6.4%  Glycemic control for   <7.0% adults with diabetes    Mean Plasma Glucose 180.03 mg/dL    Comment: Performed at [facility], [address], [place], [place] [address]  Ammonia     Status: None   Collection Time: 07/11/23  6:04 AM  Result Value Ref Range   Ammonia 26 9 - 35 umol/L    Comment: Performed at [facility], [address], [place], [place] [address]  Basic metabolic panel     Status: Abnormal   Collection Time: 07/11/23  6:04 AM  Result Value Ref Range   Sodium 152 (H) 135 - 145 mmol/L   Potassium 3.5 3.5 - 5.1 mmol/L   Chloride 114 (H) 98 - 111 mmol/L   CO2 29 22 - 32 mmol/L   Glucose, Bld 162 (H) 70 - 99 mg/dL    Comment: Glucose reference range applies only to samples taken after fasting for at least 8 hours.   BUN 103 (H) 8 - 23  mg/dL   Creatinine, Ser 6.29 (H) 0.61 - 1.24 mg/dL   Calcium 7.4 (L) 8.9 - 10.3 mg/dL   GFR, Estimated 28 (L) >60 mL/min    Comment: (NOTE) Calculated using the CKD-EPI Creatinine Equation (2021)    Anion gap 9 5 - 15    Comment: Performed at [facility], [address], [place], [place] [address]  Phosphorus     Status: Abnormal   Collection Time: 07/11/23  6:04 AM  Result Value Ref Range   Phosphorus 4.8 (H) 2.5 - 4.6 mg/dL    Comment: Performed at [facility], [address], [place], [place] [address]  Glucose, capillary     Status: Abnormal   Collection Time: 07/11/23  7:30 AM  Result Value Ref Range   Glucose-Capillary 147 (H) 70 - 99 mg/dL    Comment: Glucose reference range applies only to samples taken after fasting for at least 8 hours.  Glucose, capillary     Status: Abnormal   Collection Time: 07/11/23 11:23 AM  Result Value Ref Range   Glucose-Capillary 169 (H) 70 - 99 mg/dL    Comment: Glucose reference range applies only to samples taken after fasting for at least 8 hours.  Basic metabolic panel     Status: Abnormal   Collection Time: 07/11/23 11:43 AM  Result Value Ref Range   Sodium 153 (H) 135 - 145 mmol/L   Potassium 4.2 3.5 - 5.1 mmol/L   Chloride 113 (H) 98 - 111 mmol/L   CO2 28  22 - 32 mmol/L   Glucose, Bld 197 (H) 70 - 99 mg/dL    Comment: Glucose reference range applies only to samples taken after fasting for at least 8 hours.   BUN 102 (H) 8 - 23 mg/dL   Creatinine, Ser 0.96 (H) 0.61 - 1.24 mg/dL   Calcium 7.9 (L) 8.9 - 10.3 mg/dL   GFR, Estimated 28 (L) >60 mL/min    Comment: (NOTE) Calculated using the CKD-EPI Creatinine Equation (2021)    Anion gap 12 5 - 15    Comment: Performed at [facility], [address], [place], [place] [address]  CBC     Status: Abnormal   Collection Time: 07/11/23 11:43 AM  Result Value Ref Range   WBC 9.7 4.0 - 10.5 K/uL   RBC 2.48 (L) 4.22 - 5.81 MIL/uL   Hemoglobin 7.3 (L) 13.0 - 17.0 g/dL    HCT 98.1 (L) 19.1 - 52.0 %   MCV 103.2 (H) 80.0 - 100.0 fL   MCH 29.4 26.0 - 34.0 pg   MCHC 28.5 (L) 30.0 - 36.0 g/dL   RDW 47.8 (H) 29.5 - 62.1 %   Platelets 158 150 - 400 K/uL    Comment: REPEATED TO VERIFY   nRBC 0.7 (H) 0.0 - 0.2 %    Comment: Performed at [facility], [address] [place], [place] [address]  Glucose, capillary     Status: Abnormal   Collection Time: 07/11/23  3:14 PM  Result Value Ref Range   Glucose-Capillary 188 (H) 70 - 99 mg/dL    Comment: Glucose reference range applies only to samples taken after fasting for at least 8 hours.    MICRO:  IMAGING: ECHOCARDIOGRAM COMPLETE Result Date: 07/11/2023    ECHOCARDIOGRAM REPORT   Patient Name:   Miliano Sardo. Date of Exam: 07/11/2023 Medical Rec #:  [id number]          Height:       66.0 in Accession #:    [id number]         Weight:       175.0 lb Date of Birth:  1945/09/03           BSA:          1.890 m Patient Age:    78 years           BP:           122/58 mmHg Patient Gender: M                  HR:           68 bpm. Exam Location:  Inpatient Procedure: 2D Echo, Cardiac Doppler and Color Doppler (Both Spectral and Color            Flow Doppler were utilized during procedure). Indications:    CAD Native Vessel  History:        Patient has no prior history of Echocardiogram examinations.                 CAD, Stroke; Risk Factors:Diabetes and Hypertension.  Sonographer:    Willey Harrier Referring Phys: Arlyne Bering IMPRESSIONS  1. Left ventricular ejection fraction, by estimation, is 50%. The left ventricle has mildly decreased function. The left ventricle demonstrates global hypokinesis. There is mild concentric left ventricular hypertrophy. Left ventricular diastolic parameters are indeterminate.  2. Right ventricular systolic function is mildly reduced. The right ventricular size is mildly enlarged. Tricuspid  regurgitation signal is inadequate for assessing PA pressure.  3. Left atrial size was moderately  dilated.  4. Right atrial size was mildly dilated.  5. The mitral valve is normal in structure. Mild mitral valve regurgitation. No evidence of mitral stenosis. Moderate mitral annular calcification.  6. The aortic valve is tricuspid. There is mild calcification of the aortic valve. Aortic valve regurgitation is not visualized. No aortic stenosis is present.  7. Aortic dilatation noted. There is mild dilatation of the ascending aorta, measuring 39 mm.  8. The inferior vena cava is dilated in size with <50% respiratory variability, suggesting right atrial pressure of 15 mmHg.  9. Trivial pericardial effusion. Pleural effusion noted. 10. The patient was in atrial fibrillation. FINDINGS  Left Ventricle: Left ventricular ejection fraction, by estimation, is 50%. The left ventricle has mildly decreased function. The left ventricle demonstrates global hypokinesis. The left ventricular internal cavity size was normal in size. There is mild concentric left ventricular hypertrophy. Left ventricular diastolic parameters are indeterminate. Right Ventricle: The right ventricular size is mildly enlarged. No increase in right ventricular wall thickness. Right ventricular systolic function is mildly reduced. Tricuspid regurgitation signal is inadequate for assessing PA pressure. Left Atrium: Left atrial size was moderately dilated. Right Atrium: Right atrial size was mildly dilated. Pericardium: Trivial pericardial effusion. Pleural effusion noted. Trivial pericardial effusion is present. The pericardial effusion is circumferential. Mitral Valve: The mitral valve is normal in structure. There is mild calcification of the mitral valve leaflet(s). Moderate mitral annular calcification. Mild mitral valve regurgitation. No evidence of mitral valve stenosis. MV peak gradient, 4.9 mmHg. The mean mitral valve gradient is 2.0 mmHg. Tricuspid Valve: The tricuspid valve is normal in structure. Tricuspid valve regurgitation is trivial.  Aortic Valve: The aortic valve is tricuspid. There is mild calcification of the aortic valve. Aortic valve regurgitation is not visualized. No aortic stenosis is present. Aortic valve peak gradient measures 6.5 mmHg. Pulmonic Valve: The pulmonic valve was normal in structure. Pulmonic valve regurgitation is not visualized. Aorta: Aortic dilatation noted. There is mild dilatation of the ascending aorta, measuring 39 mm. Venous: The inferior vena cava is dilated in size with less than 50% respiratory variability, suggesting right atrial pressure of 15 mmHg. IAS/Shunts: No atrial level shunt detected by color flow Doppler.  LEFT VENTRICLE PLAX 2D LVIDd:         5.40 cm LVIDs:         4.70 cm LV PW:         1.20 cm LV IVS:        1.20 cm LVOT diam:     2.20 cm LV SV:         92 LV SV Index:   49 LVOT Area:     3.80 cm  RIGHT VENTRICLE          IVC RV Basal diam:  4.50 cm  IVC diam: 2.40 cm LEFT ATRIUM             Index        RIGHT ATRIUM           Index LA Vol (A2C):   94.3 ml 49.90 ml/m  RA Area:     19.00 cm LA Vol (A4C):   83.3 ml 44.08 ml/m  RA Volume:   54.30 ml  28.73 ml/m LA Biplane Vol: 88.2 ml 46.67 ml/m  AORTIC VALVE AV Area (Vmax): 3.38 cm AV Vmax:        127.00 cm/s AV Peak  Grad:   6.5 mmHg LVOT Vmax:      113.00 cm/s LVOT Vmean:     72.650 cm/s LVOT VTI:       0.242 m  AORTA Ao Root diam: 3.70 cm Ao Asc diam:  3.90 cm MITRAL VALVE MV Area (PHT): 4.04 cm     SHUNTS MV Area VTI:   2.83 cm     Systemic VTI:  0.24 m MV Peak grad:  4.9 mmHg     Systemic Diam: 2.20 cm MV Mean grad:  2.0 mmHg MV Vmax:       1.11 m/s MV Vmean:      56.7 cm/s MV Decel Time: 188 msec MV E velocity: 123.00 cm/s Dalton McleanMD Electronically signed by Archer Bear Signature Date/Time: 07/11/2023/9:57:36 AM    Final    US  RENAL Result Date: 07/10/2023 CLINICAL DATA:  [id number] AKI (acute kidney injury) (HCC) [id number] EXAM: RENAL / URINARY TRACT ULTRASOUND COMPLETE COMPARISON:  None Available. FINDINGS: Right Kidney: Renal  measurements: 10.9 x 5 x 4.6 cm = volume: 132 mL. Echogenicity within normal limits. Low-dense lesion along the superior pole of the right kidney likely represents a simple renal cyst. No mass or hydronephrosis visualized. Left Kidney: Renal measurements: 9.8 x 4.7 x 4.4 cm = volume: 107 mL. Echogenicity within normal limits. No mass or hydronephrosis visualized. Bladder: Appears normal for degree of bladder distention. Other: None. IMPRESSION: Unremarkable renal ultrasound. Electronically Signed   By: Morgane  Naveau M.D.   On: 07/10/2023 20:42   DG Chest Portable 1 View Result Date: 07/10/2023 CLINICAL DATA:  kidney failure EXAM: PORTABLE CHEST 1 VIEW COMPARISON:  March 25, 2022 FINDINGS: Cardiomegaly. Sternotomy wires and CABG markers. Mixed interstitial and alveolar opacities throughout the lungs, similar to slightly worse than the prior study. Bilateral pleural effusions. Retrocardiac airspace consolidation. Humeral head anchors. Multilevel degenerative disc disease of the spine. Right PICC terminating in the lower SVC. IMPRESSION: 1. Cardiomegaly. Mixed interstitial alveolar opacities throughout the lungs, similar to slightly worse than the prior study, worrisome for worsening pulmonary edema. Small bilateral pleural effusions. 2. More dense airspace consolidation in the left lower lobe, either from asymmetric edema, atelectasis, or superimposed pneumonia. Electronically Signed   By: Rance Burrows M.D.   On: 07/10/2023 13:29    HISTORICAL MICRO/IMAGING  Assessment/Plan:  78yo M resident of LTACH who is admitted for AKI, anemia, but found to have MDRO bacteremia - continue with avycaz , plan for 2 wk - please pull picc line as concern that is the cause of his infection - surprisingly he is not hypotensive/febrile/leukocytosis from bacteremia - have high suspicion he is colonized with MDRO due to being at [facility] for prolonged period of time - will get 2 months of micro data to see if he is  colonized - repeat blood cx in 48hrs - give line holiday if possible  Aki - gentle diuresis, but likely his low albumen state also contributes to 3rd spacing  Left foot pressure wound = continue with local wound care  Isolation -- he is to stay in 3M05 -- do not move to another room due to concern of contaminating the environment  Keep on contact isolation. All tube feeds in trash and nothing into the sink that may colonize sink trap.  I have personally spent 50 minutes involved in face-to-face and non-face-to-face activities for this patient on the day of the visit. Professional time spent includes the following activities: Preparing to see the patient (review of tests), Obtaining and/or reviewing separately  obtained history (admission/discharge record), Performing a medically appropriate examination and/or evaluation , Ordering medications/tests/procedures, referring and communicating with other health care professionals, Documenting clinical information in the EMR, Independently interpreting results (not separately reported), Communicating results to the patient/family/caregiver, Counseling and educating the patient/family/caregiver and Care coordination (not separately reported).    evaluation of this patient requires complex antimicrobial therapy evaluation and counseling and isolation needs for disease transmission risk assessment and mitigation.

## 2023-07-11 NOTE — Consult Note (Signed)
 WOC Nurse Consult Note: Reason for Consult: heel wound. Patient from LTAC with worsening renal function. History of DM/CVA/dementia Wound type: Unstageable Pressure Injury: left heel; 100% black eschar Deep Tissue Pressure Injury; plantar foot? Foot against foot board or WC foot rest? Presents like pressure not DM; dark purple tissue with maceration and superficial skin loss Stage 3 Pressure Injuries x 2; sacrum; 100% clean an pink Pressure Injury POA: Yes Measurement: see nursing flow sheets Wound bed:see above  Drainage (amount, consistency, odor) see nursing flow sheets Periwound: intact; plantar foot with some mild maceration  Dressing procedure/placement/frequency: LALM while in the ICU for moisture management and pressure redistribution Order air mattress if patient leaves the ICU Offload heels at all times with Prevalon boots Paint left heel with betadine daily, allow to air dry Silver hydrofiber to the left plantar foot/sacral wounds x 2, top with foam. Change every other day Consult RD for wound supplementation    Re consult if needed, will not follow at this time. Thanks  Savanah Bayles [employer], RN,CWOCN, CNS, CWON-AP [phone])

## 2023-07-12 ENCOUNTER — Inpatient Hospital Stay (HOSPITAL_COMMUNITY)

## 2023-07-12 DIAGNOSIS — J9611 Chronic respiratory failure with hypoxia: Secondary | ICD-10-CM

## 2023-07-12 DIAGNOSIS — Z930 Tracheostomy status: Secondary | ICD-10-CM | POA: Diagnosis not present

## 2023-07-12 DIAGNOSIS — N179 Acute kidney failure, unspecified: Secondary | ICD-10-CM | POA: Diagnosis not present

## 2023-07-12 DIAGNOSIS — E870 Hyperosmolality and hypernatremia: Secondary | ICD-10-CM | POA: Diagnosis not present

## 2023-07-12 LAB — GLUCOSE, CAPILLARY
Glucose-Capillary: 187 mg/dL — ABNORMAL HIGH (ref 70–99)
Glucose-Capillary: 223 mg/dL — ABNORMAL HIGH (ref 70–99)
Glucose-Capillary: 232 mg/dL — ABNORMAL HIGH (ref 70–99)
Glucose-Capillary: 235 mg/dL — ABNORMAL HIGH (ref 70–99)
Glucose-Capillary: 248 mg/dL — ABNORMAL HIGH (ref 70–99)
Glucose-Capillary: 263 mg/dL — ABNORMAL HIGH (ref 70–99)

## 2023-07-12 LAB — CBC WITH DIFFERENTIAL/PLATELET
Abs Immature Granulocytes: 0.03 10*3/uL (ref 0.00–0.07)
Basophils Absolute: 0.1 10*3/uL (ref 0.0–0.1)
Basophils Relative: 1 %
Eosinophils Absolute: 0.3 10*3/uL (ref 0.0–0.5)
Eosinophils Relative: 3 %
HCT: 25.3 % — ABNORMAL LOW (ref 39.0–52.0)
Hemoglobin: 7.4 g/dL — ABNORMAL LOW (ref 13.0–17.0)
Immature Granulocytes: 0 %
Lymphocytes Relative: 7 %
Lymphs Abs: 0.5 10*3/uL — ABNORMAL LOW (ref 0.7–4.0)
MCH: 29.8 pg (ref 26.0–34.0)
MCHC: 29.2 g/dL — ABNORMAL LOW (ref 30.0–36.0)
MCV: 102 fL — ABNORMAL HIGH (ref 80.0–100.0)
Monocytes Absolute: 0.6 10*3/uL (ref 0.1–1.0)
Monocytes Relative: 7 %
Neutro Abs: 6.6 10*3/uL (ref 1.7–7.7)
Neutrophils Relative %: 82 %
Platelets: 164 10*3/uL (ref 150–400)
RBC: 2.48 MIL/uL — ABNORMAL LOW (ref 4.22–5.81)
RDW: 20.4 % — ABNORMAL HIGH (ref 11.5–15.5)
WBC: 8.1 10*3/uL (ref 4.0–10.5)
nRBC: 0.4 % — ABNORMAL HIGH (ref 0.0–0.2)

## 2023-07-12 LAB — BASIC METABOLIC PANEL WITH GFR
Anion gap: 12 (ref 5–15)
Anion gap: 11 (ref 5–15)
BUN: 110 mg/dL — ABNORMAL HIGH (ref 8–23)
BUN: 111 mg/dL — ABNORMAL HIGH (ref 8–23)
CO2: 26 mmol/L (ref 22–32)
CO2: 26 mmol/L (ref 22–32)
Calcium: 8.1 mg/dL — ABNORMAL LOW (ref 8.9–10.3)
Calcium: 8.4 mg/dL — ABNORMAL LOW (ref 8.9–10.3)
Chloride: 114 mmol/L — ABNORMAL HIGH (ref 98–111)
Chloride: 115 mmol/L — ABNORMAL HIGH (ref 98–111)
Creatinine, Ser: 2.22 mg/dL — ABNORMAL HIGH (ref 0.61–1.24)
Creatinine, Ser: 2.03 mg/dL — ABNORMAL HIGH (ref 0.61–1.24)
GFR, Estimated: 29 mL/min — ABNORMAL LOW (ref >60)
GFR, Estimated: 33 mL/min — ABNORMAL LOW (ref >60)
Glucose, Bld: 222 mg/dL — ABNORMAL HIGH (ref 70–99)
Glucose, Bld: 279 mg/dL — ABNORMAL HIGH (ref 70–99)
Potassium: 3.7 mmol/L (ref 3.5–5.1)
Potassium: 3.9 mmol/L (ref 3.5–5.1)
Sodium: 152 mmol/L — ABNORMAL HIGH (ref 135–145)
Sodium: 152 mmol/L — ABNORMAL HIGH (ref 135–145)

## 2023-07-12 LAB — PHOSPHORUS: Phosphorus: 4.6 mg/dL (ref 2.5–4.6)

## 2023-07-12 LAB — BRAIN NATRIURETIC PEPTIDE: B Natriuretic Peptide: 909.1 pg/mL — ABNORMAL HIGH (ref 0.0–100.0)

## 2023-07-12 LAB — PROCALCITONIN: Procalcitonin: 1.49 ng/mL

## 2023-07-12 LAB — MAGNESIUM
Magnesium: 2.8 mg/dL — ABNORMAL HIGH (ref 1.7–2.4)
Magnesium: 2.8 mg/dL — ABNORMAL HIGH (ref 1.7–2.4)

## 2023-07-12 MED ORDER — DEXTROSE 5 % IV SOLN
0.9400 g | Freq: Two times a day (BID) | INTRAVENOUS | Status: DC
  Administered 2023-07-12 – 2023-07-13 (×2): 0.94 g via INTRAVENOUS
  Filled 2023-07-12 (×3): qty 4.51

## 2023-07-12 MED ORDER — INSULIN ASPART 100 UNIT/ML IJ SOLN
3.0000 [IU] | INTRAMUSCULAR | Status: DC
  Administered 2023-07-12 – 2023-07-13 (×6): 3 [IU] via SUBCUTANEOUS

## 2023-07-12 MED ORDER — FREE WATER
400.0000 mL | Status: DC
  Administered 2023-07-12 – 2023-07-14 (×14): 400 mL

## 2023-07-12 MED ORDER — ENOXAPARIN SODIUM 30 MG/0.3ML IJ SOSY
30.0000 mg | PREFILLED_SYRINGE | Freq: Every day | INTRAMUSCULAR | Status: DC
  Administered 2023-07-12: 30 mg via SUBCUTANEOUS
  Filled 2023-07-12: qty 0.3

## 2023-07-12 NOTE — Progress Notes (Signed)
 HD#2 SUBJECTIVE:  Patient Summary: Albert Vance is a 78 year old male with history of stroke, CAD, hypertension, A-fib, CKD stage III, thyroid disease, and type 2 diabetes presenting to the ED from [facility] for worsening kidney function.  Overnight Events: No acute events overnight  Interim History: Patient was evaluated at bedside with wife bedside. His eyes are closed. Discussed with wife the assessment and plan and all questions answered.  OBJECTIVE:  Vital Signs: Vitals:   07/12/23 0100 07/12/23 0200 07/12/23 0300 07/12/23 0500  BP: (!) 118/101 (!) 117/50  128/76  Pulse: 71   61  Resp: 20   (!) 22  Temp:   97.7 F (36.5 C)   TempSrc:   Oral   SpO2: 100%   97%  Weight:    76.3 kg  Height:       Supplemental O2:  Ventilator SpO2: 97 % FiO2 (%): 40 %  Filed Weights   07/10/23 1742 07/12/23 0500  Weight: 79.4 kg 76.3 kg     Intake/Output Summary (Last 24 hours) at 07/12/2023 1914 Last data filed at 07/12/2023 0600 Gross per 24 hour  Intake 1844.21 ml  Output 2500 ml  Net -655.79 ml   Net IO Since Admission: 429.51 mL [07/12/23 0638]  Physical Exam: Physical Exam Constitutional:      Appearance: He is ill-appearing.  HENT:     Head:     Comments: Trach in place Cardiovascular:     Rate and Rhythm: Normal rate and regular rhythm.  Pulmonary:     Effort: Pulmonary effort is normal.     Breath sounds: Normal breath sounds.  Abdominal:     General: Abdomen is flat. Bowel sounds are normal.     Palpations: Abdomen is soft.  Musculoskeletal:     Right lower leg: Edema present.     Left lower leg: Edema present.  Psychiatric:        Attention and Perception: He is inattentive.        Speech: He is noncommunicative.    CBC    Component Value Date/Time   WBC 8.1 07/12/2023 0307   RBC 2.48 (L) 07/12/2023 0307   HGB 7.4 (L) 07/12/2023 0307   HCT 25.3 (L) 07/12/2023 0307   PLT 164 07/12/2023 0307   MCV 102.0 (H) 07/12/2023 0307   MCH 29.8 07/12/2023 0307    MCHC 29.2 (L) 07/12/2023 0307   RDW 20.4 (H) 07/12/2023 0307   LYMPHSABS 0.5 (L) 07/12/2023 0307   MONOABS 0.6 07/12/2023 0307   EOSABS 0.3 07/12/2023 0307   BASOSABS 0.1 07/12/2023 0307      Latest Ref Rng & Units 07/12/2023    3:07 AM 07/11/2023   11:43 AM 07/11/2023    6:04 AM  BMP  Glucose 70 - 99 mg/dL 782  956  213   BUN 8 - 23 mg/dL 086  578  469   Creatinine 0.61 - 1.24 mg/dL 6.29  5.28  4.13   Sodium 135 - 145 mmol/L 152  153  152   Potassium 3.5 - 5.1 mmol/L 3.7  4.2  3.5   Chloride 98 - 111 mmol/L 114  113  114   CO2 22 - 32 mmol/L 26  28  29    Calcium 8.9 - 10.3 mg/dL 8.1  7.9  7.4    ASSESSMENT/PLAN:  Assessment: Principal Problem:   Hypernatremia Active Problems:   AKI (acute kidney injury) (HCC)   Ventilator dependent (HCC)   Symptomatic anemia   Anasarca   Pressure  injury of skin of left heel   Hypoalbuminemia   Klebsiella sepsis (HCC)  Albert Vance is a 78 year old male with history of stroke, CAD, hypertension, A-fib, CKD stage III, thyroid disease, and type 2 diabetes presenting to the ED from [facility] for worsening kidney function.  Plan: #Klebsiella Bacteremia  Blood cultures notable for Klebsiella species with carbapenem resistance.  He is currently on Avycaz  (ceftazidime -avibactam). Remains afebrile with no leukocytosis. His PICC line was removed yesterday. His vitals remain stable. Source either from urine (has a chronic foley that has since been replaced) or pulmonary.  - Continue Avycaz  (Day 2) - Repeat blood cultures tomorrow - Trend fever curve, CBC  #AKI secondary to hypovolemic hyponatremia #Uremia Sodium remains elevated at 152. Likely in the setting of hypovolemia and decreased albumin with third spacing. Creatinine slightly improved to 2.22. Urea elevated at 110. His free water deficit is 3.3L, will supplement. Last albumin checked on May 1st was very low at 1.7, may replete today to help with volume retention and decrease third spacing.    #Macrocytic anemia 2/2 chronic disease Initial hemoglobin of 6.6 improved after unit of blood to 7.6. Currently at 7.4.  - Trend CBC   #Pulmonary Edema vs Infiltrates #Anasarca PCCM following and continues to manage ventilator.  Procalcitonin remains elevated at 2.45.  BNP elevated at one 1637.6.  Remains volume overloaded despite Lasix  yesterday.  For the time being, we will continue to trend this.  #Atrial Fibrillation  Remains hemodynamically stable with normal heart rate. - Continue cardiac monitoring  #T2DM  Continue SSI.   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   #Goals of Care Remains full code.  Discussed plan for palliative care consult with patient's wife today, and she is amenable.  For the time being, we will continue full scope of care. - Follow-up palliative recs  Best Practice: Diet: N.p.o. VTE: Place and maintain sequential compression device Start: 07/10/23 1641 SCDs Start: 07/10/23 1601 Code: Full AB: Avycaz  Family Contact: Wife, to be notified. DISPO: Anticipated discharge  TBD  to  TBD  pending IV antibiotics.  Signature: Jaecion Dempster, MD Internal Medicine Resident, PGY-2 [facility]  Pager: [phone]  Please contact the on call pager after 5 pm and on weekends at [phone].

## 2023-07-12 NOTE — Plan of Care (Signed)
     Referral received for Albert Vance.: goals of care discussion. Chart reviewed and updates received from RN. Patient assessed and is unable to engage appropriately in discussions.   I was able to speak with patient's wife Albert Vance. GOC meeting scheduled for 07/13/2023 @ 1600. Family is aware we will meet at patient's bedside.   Detailed note and recommendations to follow once GOC has been completed.   Thank you for your referral and allowing PMT to assist in Albert Knoll Jr.'s care.   Joaquim Muir, NP Palliative Medicine Team  Team Phone # [phone]   NO CHARGE

## 2023-07-12 NOTE — Progress Notes (Signed)
 PHARMACY NOTE:  ANTIMICROBIAL RENAL DOSAGE ADJUSTMENT  Current antimicrobial regimen includes a mismatch between antimicrobial dosage and estimated renal function.  As per policy approved by the Pharmacy & Therapeutics and Medical Executive Committees, the antimicrobial dosage will be adjusted accordingly.  Current antimicrobial dosage:  Avycaz  1.25gm IV Q8H  Indication: KPC bacteremia  Renal Function:  Estimated Creatinine Clearance: 24.3 mL/min (A) (by C-G formula based on SCr of 2.22 mg/dL (H)). []      On intermittent HD, scheduled: []      On CRRT    Antimicrobial dosage has been changed to:  Avycaz  0.94gm IV Q12H  Calla Wedekind D. Marikay Show, PharmD, BCPS, BCCCP 07/12/2023, 12:24 PM

## 2023-07-12 NOTE — Progress Notes (Signed)
 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 Dementia, Afib (off NOAC), PCM, and anemia of chronic illness. He has been a resident of [facility] since 01/2023 when he was dc from [facility] for SDH/sequelae of. He presented to [facility] ED 5/1 from [facility] for further evaluation of abnormal labs -- noted to have elevated Cr and low hgb. Foley exchanged. Given one unit of PRBC and 1 L NS 0.9% bolus.   On stable vent settings (PRVC 20/510/+5/40%) and without trach complications.  PCCM consulted for evaluation in this setting    Pertinent  Medical History  SDH, seizures (weaned off keppra), multiple aspiration PNA, failed extubation, chronic resp failure requiring trach/PEG, PTSD, DM-2, HTN, CAD, GERD, ? Dementia, Afib (off NOAC), PCM, and anemia of chronic illness Significant Hospital Events: Including procedures, antibiotic start and stop dates in addition to other pertinent events   5/1 1 PRBC for hgb 6.6. PCCM consult  5/1 foley exchanged in the ED here. 5/2 cultures positive for klebsiella with CRE  Interim History / Subjective:  No acute issues overnight Objective   Blood pressure (!) 117/54, pulse 63, temperature (!) 97.3 F (36.3 C), temperature source Axillary, resp. rate 20, height 5\' 6"  (1.676 m), weight 76.3 kg, SpO2 100%.    Vent Mode: PRVC FiO2 (%):  [40 %] 40 % Set Rate:  [20 bmp] 20 bmp Vt Set:  [510 mL] 510 mL PEEP:  [5 cmH20] 5 cmH20 Plateau Pressure:  [20 cmH20-25 cmH20] 24 cmH20   Intake/Output Summary (Last 24 hours) at 07/12/2023 0749 Last data filed at 07/12/2023 0600 Gross per 24 hour   Intake 1844.21 ml  Output 2500 ml  Net -655.79 ml   Filed Weights   07/10/23 1742 07/12/23 0500  Weight: 79.4 kg 76.3 kg    Examination: General Chronically ill-appearing 78 year old male patient laying in bed awake, semiinteractive tremulous HEENT #7 Portex tracheostomy midline.  Has some intermittent cuff leak, however getting full tidal volumes on ventilator Pulmonary scattered rhonchi bilaterally.  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 c/b R cerebellar hemorrhage and seizures)  Acute  metabolic encephalopathy DM-2 w/ hyperglycemia HTN, CAD, s/p CABG, S-CHD (EF at [facility] 44%)   Afib (off NOAC) GERD Severe PCM Acute anemia on chronic anemia of chronic illness: Intermittent fluid and electrolyte imbalance: hypernatremia, hyperchloremia,    Pulm prob list Chronic respiratory failure w/ Trach/vent dependence,.  Complicated by bilateral airspace disease left basilar volume loss differential diagnosis includes pneumonia, pulmonary edema, or ALI we do not currently have 7.0 portex for trach exchanges this  has been ordered Plan Continuing full ventilator support Check procalcitonin as well as BNP and sputum  From a ventilator standpoint he is stable, he would likely benefit from diuresis but currently he has a fairly significant water imbalance with significant  hyponatremia so we will hold off from Lasix  today In regards to tracheostomy currently has a size 6 cuffed at bedside, the outside diameter slightly larger than his Portex, this could make placement difficult.  A backup tracheostomy has been ordered, we will also place a size 4 cuffed trach at bedside which could act as a placeholder should we need to dilate Continue full ventilator support VAP bundle   All other issues per IM service  Best Practice (right click and "Reselect all SmartList Selections" daily)   Per primary. PCCM will follow going forward for vent management.   My time 32 min

## 2023-07-12 NOTE — Plan of Care (Signed)
  Problem: Activity: Goal: Ability to tolerate increased activity will improve Outcome: Progressing   Problem: Respiratory: Goal: Ability to maintain a clear airway and adequate ventilation will improve Outcome: Progressing   Problem: Role Relationship: Goal: Method of communication will improve Outcome: Progressing   Problem: Education: Goal: Knowledge of General Education information will improve Description: Including pain rating scale, medication(s)/side effects and non-pharmacologic comfort measures Outcome: Progressing   Problem: Health Behavior/Discharge Planning: Goal: Ability to manage health-related needs will improve Outcome: Progressing   Problem: Clinical Measurements: Goal: Ability to maintain clinical measurements within normal limits will improve Outcome: Progressing Goal: Will remain free from infection Outcome: Progressing Goal: Diagnostic test results will improve Outcome: Progressing Goal: Respiratory complications will improve Outcome: Progressing Goal: Cardiovascular complication will be avoided Outcome: Progressing   Problem: Activity: Goal: Risk for activity intolerance will decrease Outcome: Progressing   Problem: Nutrition: Goal: Adequate nutrition will be maintained Outcome: Progressing   Problem: Coping: Goal: Level of anxiety will decrease Outcome: Progressing   Problem: Elimination: Goal: Will not experience complications related to bowel motility Outcome: Progressing Goal: Will not experience complications related to urinary retention Outcome: Progressing   Problem: Pain Managment: Goal: General experience of comfort will improve and/or be controlled Outcome: Progressing   Problem: Safety: Goal: Ability to remain free from injury will improve Outcome: Progressing   Problem: Skin Integrity: Goal: Risk for impaired skin integrity will decrease Outcome: Progressing

## 2023-07-13 DIAGNOSIS — N179 Acute kidney failure, unspecified: Secondary | ICD-10-CM | POA: Diagnosis not present

## 2023-07-13 DIAGNOSIS — Z930 Tracheostomy status: Secondary | ICD-10-CM | POA: Diagnosis not present

## 2023-07-13 DIAGNOSIS — Z515 Encounter for palliative care: Secondary | ICD-10-CM

## 2023-07-13 DIAGNOSIS — E870 Hyperosmolality and hypernatremia: Secondary | ICD-10-CM | POA: Diagnosis not present

## 2023-07-13 DIAGNOSIS — Z7189 Other specified counseling: Secondary | ICD-10-CM | POA: Diagnosis not present

## 2023-07-13 DIAGNOSIS — J9611 Chronic respiratory failure with hypoxia: Secondary | ICD-10-CM | POA: Diagnosis not present

## 2023-07-13 LAB — GLUCOSE, CAPILLARY
Glucose-Capillary: 249 mg/dL — ABNORMAL HIGH (ref 70–99)
Glucose-Capillary: 261 mg/dL — ABNORMAL HIGH (ref 70–99)
Glucose-Capillary: 263 mg/dL — ABNORMAL HIGH (ref 70–99)
Glucose-Capillary: 233 mg/dL — ABNORMAL HIGH (ref 70–99)
Glucose-Capillary: 183 mg/dL — ABNORMAL HIGH (ref 70–99)
Glucose-Capillary: 177 mg/dL — ABNORMAL HIGH (ref 70–99)

## 2023-07-13 LAB — RENAL FUNCTION PANEL
Albumin: 1.5 g/dL — ABNORMAL LOW (ref 3.5–5.0)
Anion gap: 11 (ref 5–15)
BUN: 103 mg/dL — ABNORMAL HIGH (ref 8–23)
CO2: 26 mmol/L (ref 22–32)
Calcium: 8.3 mg/dL — ABNORMAL LOW (ref 8.9–10.3)
Chloride: 114 mmol/L — ABNORMAL HIGH (ref 98–111)
Creatinine, Ser: 1.93 mg/dL — ABNORMAL HIGH (ref 0.61–1.24)
GFR, Estimated: 35 mL/min — ABNORMAL LOW (ref >60)
Glucose, Bld: 294 mg/dL — ABNORMAL HIGH (ref 70–99)
Phosphorus: 4.7 mg/dL — ABNORMAL HIGH (ref 2.5–4.6)
Potassium: 4 mmol/L (ref 3.5–5.1)
Sodium: 151 mmol/L — ABNORMAL HIGH (ref 135–145)

## 2023-07-13 LAB — MAGNESIUM: Magnesium: 2.7 mg/dL — ABNORMAL HIGH (ref 1.7–2.4)

## 2023-07-13 LAB — CARBAPENEM RESISTANCE PANEL
Carba Resistance IMP Gene: NOT DETECTED
Carba Resistance KPC Gene: DETECTED — AB
Carba Resistance NDM Gene: NOT DETECTED
Carba Resistance OXA48 Gene: NOT DETECTED
Carba Resistance VIM Gene: NOT DETECTED

## 2023-07-13 LAB — CBC
HCT: 25.9 % — ABNORMAL LOW (ref 39.0–52.0)
Hemoglobin: 7.6 g/dL — ABNORMAL LOW (ref 13.0–17.0)
MCH: 30.2 pg (ref 26.0–34.0)
MCHC: 29.3 g/dL — ABNORMAL LOW (ref 30.0–36.0)
MCV: 102.8 fL — ABNORMAL HIGH (ref 80.0–100.0)
Platelets: 188 10*3/uL (ref 150–400)
RBC: 2.52 MIL/uL — ABNORMAL LOW (ref 4.22–5.81)
RDW: 20.4 % — ABNORMAL HIGH (ref 11.5–15.5)
WBC: 8.4 10*3/uL (ref 4.0–10.5)
nRBC: 0.2 % (ref 0.0–0.2)

## 2023-07-13 LAB — PHOSPHORUS: Phosphorus: 4.7 mg/dL — ABNORMAL HIGH (ref 2.5–4.6)

## 2023-07-13 MED ORDER — INSULIN GLARGINE-YFGN 100 UNIT/ML ~~LOC~~ SOLN
20.0000 [IU] | Freq: Every day | SUBCUTANEOUS | Status: DC
  Administered 2023-07-13: 20 [IU] via SUBCUTANEOUS
  Filled 2023-07-13 (×2): qty 0.2

## 2023-07-13 MED ORDER — INSULIN GLARGINE 100 UNITS/ML SOLOSTAR PEN: 20.0000 [IU] | PEN_INJECTOR | Freq: Every day | SUBCUTANEOUS | Status: DC

## 2023-07-13 MED ORDER — ENOXAPARIN SODIUM 40 MG/0.4ML IJ SOSY
40.0000 mg | PREFILLED_SYRINGE | Freq: Every day | INTRAMUSCULAR | Status: DC
  Administered 2023-07-13 – 2023-07-22 (×10): 40 mg via SUBCUTANEOUS
  Filled 2023-07-13 (×10): qty 0.4

## 2023-07-13 MED ORDER — INSULIN ASPART 100 UNIT/ML IJ SOLN
6.0000 [IU] | INTRAMUSCULAR | Status: DC
  Administered 2023-07-13 – 2023-07-14 (×6): 6 [IU] via SUBCUTANEOUS

## 2023-07-13 MED ORDER — ENOXAPARIN SODIUM 40 MG/0.4ML IJ SOSY: 40.0000 mg | PREFILLED_SYRINGE | Freq: Every day | INTRAMUSCULAR | Status: DC

## 2023-07-13 MED ORDER — DEXTROSE 5 % IV SOLN
1.2500 g | Freq: Three times a day (TID) | INTRAVENOUS | Status: AC
  Administered 2023-07-13 – 2023-07-24 (×35): 1.25 g via INTRAVENOUS
  Filled 2023-07-13 (×38): qty 6

## 2023-07-13 NOTE — Plan of Care (Signed)
  Problem: Clinical Measurements: Goal: Respiratory complications will improve Outcome: Progressing   Problem: Nutrition: Goal: Adequate nutrition will be maintained Outcome: Progressing   

## 2023-07-13 NOTE — Consult Note (Signed)
 Palliative Care Consult Note                                  Date: 07/13/2023   Patient Name: Albert Vance.  DOB: 1946-01-20  MRN: [id number]  Age / Sex: 78 y.o., male  PCP: Shackleford, Betsy Brown, MD Referring Physician: Priscella Brooms, DO  Reason for Consultation: Establishing goals of care  HPI/Patient Profile: 78 y.o. 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 Found to be septic due to CRE klebsiella UTI and bacteremia with AKI.    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   Anemia    Chronic kidney disease (CKD), stage III (moderate) (HCC)    Chronic pain syndrome    Coronary artery disease    Dependence on respirator (ventilator) status (HCC)    Diabetes mellitus without complication (HCC)    Disorder of thyroid    Dysphagia, oropharyngeal phase    Encephalopathy    Generalized anxiety disorder    GERD (gastroesophageal reflux disease)    Glaucoma in diseases classified elsewhere    Hemiplegia and hemiparesis following cerebral infarction affecting left non-dominant side (HCC)    Hyperkalemia    Hypertension    Insomnia    Muscle weakness (generalized)    Other sequelae of cerebral infarction    Pneumonitis due to inhalation of food and vomit (HCC)    PTSD (post-traumatic stress disorder)    Renal disorder    Respiratory failure (HCC)    Stroke (HCC)    Traumatic subdural hemorrhage without loss of consciousness, sequela (HCC)     Subjective:   I have reviewed medical records including EPIC notes, labs and imaging, received updates from nursing and team,  assessed the patient and then met with his wife Albert Vance at bedside to discuss diagnosis prognosis, GOC, EOL wishes, disposition and options. Patient was unable to participate in conversation.  I introduced Palliative Medicine as specialized medical care for people living with serious illness. It focuses on providing relief from symptoms and stress of a serious illness. The goal is to improve quality of life for both the patient and the family.  Patient's wife faces treatment option decisions, advanced directive decisions, and anticipatory care needs.  Today's Discussion: Patient's wife shares that critical care NP had just given her an update. She is tearful. She tells me that she hears the team tell her that her husband has a poor prognosis. He will likely never have much functional improvement. She shared that she hears the team but her faith also makes her hopeful that he can have a significant recovery. She discussed her frustrations with his hospitalization at [facility] and care at [facility]. We discussed his chronic conditions and current hospitalization with new carbapenem resistant Klebsiella infection.  The patient  and his wife have been married over 40 years. They have three children together and the patient has a child from his first marriage. He is a [place] vet. He worked in [employer] over the years and enjoyed being active. Over the years the patient had become slower with activities and required more help over the years, but he was overall independent prior to his subdural hematoma in 12/2022. He has been living at [facility] since his discharge from that hospitalization. He continues to have a PEG tube and tracheostomy requiring ventilator support. We discussed his overall failure to thrive and small reserve.  A discussion was had today regarding advanced directives. At some point the patient had told Albert Vance that he would want medical interventions to keep him going but they had never discussed  goals of care in detail. We discussed code status. Recommended consideration of DNR status, understanding evidenced-based poor outcomes in similar hospitalized patients, as the cause of the arrest is likely associated with chronic/terminal disease rather than a reversible acute cardio-pulmonary event. Albert Vance is very concerned that DNR status would change treatment and scope of care. We discussed for some time that DNR status only goes into effect when the patient has no pulse and is not breathing. Albert Vance will consider DNR status (her daughter had previously discussed this with her) for now the patient will remain full code. We discuss scopes of care and the difference between an aggressive medical intervention path and a palliative comfort care path. Albert Vance would like to keep the patient full scope of care.  I encouraged Albert Vance to consider considering the patient's quality of life, overall suffering, what would be acceptable to him in the future.  Discussed the importance of continued conversation with family and the medical providers regarding overall plan of care and treatment options, ensuring decisions are within the context of the patient's values and GOCs.  Questions and concerns were addressed. Hard Choices booklet left for review. The family was encouraged to call with questions or concerns. PMT will continue to support holistically.  Review of Systems  Unable to perform ROS   Objective:   Primary Diagnoses: Present on Admission:  Hypernatremia   Physical Exam Vitals reviewed.  Constitutional:      General: He is not in acute distress.    Appearance: He is ill-appearing.  HENT:     Head: Normocephalic and atraumatic.  Cardiovascular:     Rate and Rhythm: Normal rate.  Pulmonary:     Comments: ventilator    Vital Signs:  BP (!) 115/46   Pulse 76   Temp 98.2 F (36.8 C) (Axillary)   Resp (!) 23   Ht 5\' 6"  (1.676 m)   Wt 79.9 kg   SpO2 92%   BMI 28.43 kg/m   Palliative  Assessment/Data: 30 % with feeds    Advanced Care Planning:   Existing Vynca/ACP Documentation: None  Primary Decision Maker: NEXT OF KIN. Patient's wife Albert Vance.  Code Status/Advance Care Planning: Full code  Assessment & Plan:   SUMMARY OF RECOMMENDATIONS   Full code Full scope of care Encouraged patient's wife to consider DNR status and goals of care PMT will continue to support   Discussed with: bedside RN, Sheryle Donning NP, and Dr. Jolly Needle  Time Total: 90 minutes    Thank you for allowing us  to participate in the care of Westen R Tsao Jr. PMT will continue to support holistically.    Signed by: Joaquim Muir, NP Palliative Medicine Team  Team Phone #  [phone] (Nights/Weekends)  07/13/2023, 12:54 PM

## 2023-07-13 NOTE — Progress Notes (Signed)
 HD#3 SUBJECTIVE:  Patient Summary: Albert Vance is a 78 year old male with history of stroke, CAD, hypertension, A-fib, CKD stage III, thyroid disease, and type 2 diabetes presenting to the ED from [facility] for worsening kidney function.  Overnight Events: No acute events overnight  Interim History: Patient was evaluated at bedside with wife bedside. His eyes are closed. Had just met with palliative care  OBJECTIVE:  Vital Signs: Vitals:   07/13/23 0500 07/13/23 0600 07/13/23 0700 07/13/23 0729  BP: (!) 140/124 122/71 (!) 134/117   Pulse: 79 70 76   Resp: (!) 23 (!) 25 (!) 23   Temp:    98 F (36.7 C)  TempSrc:    Axillary  SpO2: 99% 99% 100%   Weight: 79.9 kg     Height:       Supplemental O2:  Ventilator SpO2: 100 % FiO2 (%): 40 %  Filed Weights   07/10/23 1742 07/12/23 0500 07/13/23 0500  Weight: 79.4 kg 76.3 kg 79.9 kg     Intake/Output Summary (Last 24 hours) at 07/13/2023 1048 Last data filed at 07/13/2023 0700 Gross per 24 hour  Intake 907.78 ml  Output 1450 ml  Net -542.22 ml   Net IO Since Admission: 336.69 mL [07/13/23 1048]  Physical Exam: Physical Exam Constitutional:      Appearance: He is ill-appearing.  HENT:     Head:     Comments: Trach in place Cardiovascular:     Rate and Rhythm: Normal rate and regular rhythm.  Pulmonary:     Effort: Pulmonary effort is normal.     Breath sounds: Normal breath sounds.  Abdominal:     General: Abdomen is flat. Bowel sounds are normal.     Palpations: Abdomen is soft.  Musculoskeletal:     Right lower leg: Edema present.     Left lower leg: Edema present.  Psychiatric:        Attention and Perception: He is inattentive.        Speech: He is noncommunicative.    CBC    Component Value Date/Time   WBC 8.4 07/13/2023 0816   RBC 2.52 (L) 07/13/2023 0816   HGB 7.6 (L) 07/13/2023 0816   HCT 25.9 (L) 07/13/2023 0816   PLT 188 07/13/2023 0816   MCV 102.8 (H) 07/13/2023 0816   MCH 30.2 07/13/2023 0816    MCHC 29.3 (L) 07/13/2023 0816   RDW 20.4 (H) 07/13/2023 0816   LYMPHSABS 0.5 (L) 07/12/2023 0307   MONOABS 0.6 07/12/2023 0307   EOSABS 0.3 07/12/2023 0307   BASOSABS 0.1 07/12/2023 0307      Latest Ref Rng & Units 07/13/2023    8:16 AM 07/12/2023    8:10 PM 07/12/2023    3:07 AM  BMP  Glucose 70 - 99 mg/dL 308  657  846   BUN 8 - 23 mg/dL 962  952  841   Creatinine 0.61 - 1.24 mg/dL 3.24  4.01  0.27   Sodium 135 - 145 mmol/L 151  152  152   Potassium 3.5 - 5.1 mmol/L 4.0  3.9  3.7   Chloride 98 - 111 mmol/L 114  115  114   CO2 22 - 32 mmol/L 26  26  26    Calcium 8.9 - 10.3 mg/dL 8.3  8.4  8.1    ASSESSMENT/PLAN:  Assessment: Principal Problem:   Hypernatremia Active Problems:   AKI (acute kidney injury) (HCC)   Ventilator dependent (HCC)   Symptomatic anemia   Anasarca  Pressure injury of skin of left heel   Hypoalbuminemia   Klebsiella sepsis (HCC)  Albert Vance is a 78 year old male with history of stroke, CAD, hypertension, A-fib, CKD stage III, thyroid disease, and type 2 diabetes presenting to the ED from [facility] for worsening kidney function.  Plan: #Klebsiella Bacteremia  Blood cultures notable for Klebsiella species with carbapenem resistance.  He is currently on Avycaz  (ceftazidime -avibactam). Remains afebrile with no leukocytosis. His PICC line was removed yesterday. His vitals remain stable. Source either from urine (has a chronic foley that has since been replaced) or pulmonary.  - Continue Avycaz  (Day 3) - Repeat blood cultures today - Trend fever curve, CBC  #AKI secondary to hypovolemic hyponatremia #Uremia Sodium remains elevated at 151. Likely in the setting of hypovolemia and decreased albumin with third spacing. Creatinine improving to 1.93. Urea improving as well. His free water deficit is 3.3L, will supplement.   #Macrocytic anemia 2/2 chronic disease Initial hemoglobin of 6.6 improved after unit of blood to 7.6. Currently at 7.4.  - Trend CBC    #Pulmonary Edema vs Infiltrates #Anasarca PCCM following and continues to manage ventilator.  Procalcitonin remains elevated at 2.45.  BNP elevated at one 1637.6.  Remains volume overloaded despite Lasix  yesterday.  For the time being, we will continue to trend this.  #Atrial Fibrillation  Remains hemodynamically stable with normal heart rate. - Continue cardiac monitoring  #T2DM  Started long acting insulin  20U, and increased novolog  to 6U Q4H. Glucose still in the 250-300 range, so hoping these changes help with improved control.   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   #Goals of Care Remains full code. Patient's wife is primary Management consultant and met with palliative care bedside. She is leaning towards DNR status but needs to discuss with children. Still full scope of care.  - Follow-up palliative recs  Best Practice: Diet: N.p.o. VTE: enoxaparin (LOVENOX) injection 40 mg Start: 07/13/23 1115 Place and maintain sequential compression device Start: 07/10/23 1641 SCDs Start: 07/10/23 1601 Code: Full AB: Avycaz  Family Contact: Wife, to be notified. DISPO: Anticipated discharge  TBD  to  TBD  pending IV antibiotics.  Signature: Markie Frith, MD Internal Medicine Resident, PGY-2 [facility]  Pager: [phone]  Please contact the on call pager after 5 pm and on weekends at [phone].

## 2023-07-13 NOTE — Progress Notes (Signed)
 PHARMACY NOTE:  ANTIMICROBIAL RENAL DOSAGE ADJUSTMENT  Current antimicrobial regimen includes a mismatch between antimicrobial dosage and estimated renal function.  As per policy approved by the Pharmacy & Therapeutics and Medical Executive Committees, the antimicrobial dosage will be adjusted accordingly.  Current antimicrobial dosage:  Avycaz  0.94gm IV Q12H  Indication: KPC bacteremia  Renal Function:  Estimated Creatinine Clearance: 30.8 mL/min (A) (by C-G formula based on SCr of 1.93 mg/dL (H)). []      On intermittent HD, scheduled: []      On CRRT    Antimicrobial dosage has been changed to:  Avycaz  1.25gm IV Q8H  Cedrick Partain D. Marikay Show, PharmD, BCPS, BCCCP 07/13/2023, 10:16 AM

## 2023-07-13 NOTE — Progress Notes (Signed)
 NAME:  Albert Vance., MRN:  [id number], DOB:  07/30/1945, LOS: 3 ADMISSION DATE:  07/10/2023, CONSULTATION DATE:  07/10/23 REFERRING MD:  Scarlette Currier, CHIEF COMPLAINT:  abnormal labs   History of Present Illness:  A 78 yr old male patient with hx of SDH (s/p mini crani/evac 12/2022 at [facility] c/b R cerebellar hemorrhage and seizures), multiple aspiration PNA, failed extubation, chronic resp failure requiring trach/PEG, PTSD, DM-2, HTN, CAD, GERD, ? Dementia, Afib (off NOAC), PCM, and anemia of chronic illness. He has been a resident of [facility] since 01/2023 when he was dc from [facility] for SDH/sequelae of. He presented to [facility] ED 5/1 from [facility] for further evaluation of abnormal labs -- noted to have elevated Cr and low hgb. Foley exchanged. Given one unit of PRBC and 1 L NS 0.9% bolus.   On stable vent settings (PRVC 20/510/+5/40%) and without trach complications.  PCCM consulted for evaluation in this setting    Pertinent  Medical History  SDH, seizures (weaned off keppra), multiple aspiration PNA, failed extubation, chronic resp failure requiring trach/PEG, PTSD, DM-2, HTN, CAD, GERD, ? Dementia, Afib (off NOAC), PCM, and anemia of chronic illness Significant Hospital Events: Including procedures, antibiotic start and stop dates in addition to other pertinent events   5/1 1 PRBC for hgb 6.6. PCCM consult  5/1 foley exchanged in the ED here. 5/2 cultures positive for klebsiella with CRE  Interim History / Subjective:  No acute issues overnight Objective   Blood pressure (!) 134/117, pulse 76, temperature 98 F (36.7 C), temperature source Axillary, resp. rate (!) 23, height 5\' 6"  (1.676 m), weight 79.9 kg, SpO2 100%.    Vent Mode: PRVC FiO2 (%):  [40 %] 40 % Set Rate:  [20 bmp] 20 bmp Vt Set:  [510 mL] 510 mL PEEP:  [5 cmH20] 5 cmH20 Plateau Pressure:  [15 cmH20-16 cmH20] 15 cmH20   Intake/Output Summary (Last 24 hours) at 07/13/2023 0805 Last data filed at 07/13/2023 0700 Gross per 24 hour   Intake 967.78 ml  Output 1450 ml  Net -482.22 ml   Filed Weights   07/10/23 1742 07/12/23 0500 07/13/23 0500  Weight: 79.4 kg 76.3 kg 79.9 kg    Examination: Chronically ill-appearing tremulous 78 year old male patient lying in bed his eyes are open, intermittently appears to be interacting a little bit with his spouse who is at bedside HEENT normocephalic atraumatic has a size 7 Portex which is midline mucous membranes are moist Pulmonary coarse diffuse breath sounds, currently on full ventilator support Portable chest x-ray from yesterday personally reviewed showed The tracheostomy and satisfactory  position there is bilateral airspace disease which is patchy in nature and comparing films from 2 days prior not really significant change Cardiac regular rate and rhythm Abdomen soft nontender Extremities are warm and dry with diffuse anasarca Neuro left-sided weakness, moves right side spontaneously, has continuous tremor, not following commands, not really communicating, but appears to track at times GU clear yellow  Resolved Hospital Problem list     Assessment & Plan:  Chronic respiratory failure Trach/vent dependence  Severe sepsis secondary to CRE Klebsiella UTI with bacteremia AKI/?CKD: Improving a little bit with volume replacement H/o SDH (s/p mini crani/evac 12/2022 at [facility] c/b R cerebellar hemorrhage and seizures)  Acute  metabolic encephalopathy DM-2 w/ hyperglycemia: A little worse on 5/4 HTN, CAD, s/p CABG, S-CHD (EF at [facility] 44%)   Afib (off NOAC) GERD Severe PCM Acute anemia on chronic anemia of chronic illness: Intermittent fluid and  electrolyte imbalance: hypernatremia, hyperchloremia: Remains fairly hyponatremic in spite of free water replacement  Pulm prob list Chronic respiratory failure w/ Trach/vent dependence,.  Complicated by VAP suspect CRE Klebsiella  Discussion:  Seen by palliative care on 5/4.  Awaiting formal recommendations.Sputum sent yesterday  showing abundant gram-negative rods, suspect this will be the CRE Klebsiella as well.  I had a very frank discussion with the patient's wife at bedside.  I think he is very unlikely to improve much.  Although I think he can probably survive the pneumonia he will almost certainly become septic again in the near future.  This far out I do not expect him to rehabilitate, I do not see the tracheostomy being removed, and I feel that he is most likely ventilator dependent.  I have highly recommended DO NOT RESUSCITATE should he suffer a cardiac arrest in the future.  Plan Continuing full ventilator support Continue current antimicrobial therapy,He is currently on Avycaz , as directed by infectious disease VAP bundle In regards to tracheostomy currently has a size 6 cuffed at bedside, the outside diameter slightly larger than his Portex, this could make placement difficult.  A backup tracheostomy has been ordered, we will also place a size 4 cuffed trach at bedside which could act as a placeholder should we need to dilate Highly recommend DNR status.  I have recommended this to the spouse.  Currently she is speaking with the palliative care team   All other issues per IM service  Best Practice (right click and "Reselect all SmartList Selections" daily)   Per primary. PCCM will follow going forward for vent management.  My critical care time is 42 minutes

## 2023-07-14 ENCOUNTER — Encounter (HOSPITAL_COMMUNITY): Payer: Self-pay | Admitting: Infectious Diseases

## 2023-07-14 DIAGNOSIS — Z1630 Resistance to unspecified antimicrobial drugs: Secondary | ICD-10-CM | POA: Diagnosis not present

## 2023-07-14 DIAGNOSIS — N179 Acute kidney failure, unspecified: Secondary | ICD-10-CM | POA: Diagnosis not present

## 2023-07-14 DIAGNOSIS — E8809 Other disorders of plasma-protein metabolism, not elsewhere classified: Secondary | ICD-10-CM | POA: Diagnosis not present

## 2023-07-14 DIAGNOSIS — R7881 Bacteremia: Secondary | ICD-10-CM

## 2023-07-14 DIAGNOSIS — B961 Klebsiella pneumoniae [K. pneumoniae] as the cause of diseases classified elsewhere: Secondary | ICD-10-CM | POA: Diagnosis not present

## 2023-07-14 DIAGNOSIS — Z930 Tracheostomy status: Secondary | ICD-10-CM | POA: Diagnosis not present

## 2023-07-14 DIAGNOSIS — A498 Other bacterial infections of unspecified site: Secondary | ICD-10-CM | POA: Insufficient documentation

## 2023-07-14 DIAGNOSIS — J9611 Chronic respiratory failure with hypoxia: Secondary | ICD-10-CM | POA: Diagnosis not present

## 2023-07-14 DIAGNOSIS — Z9911 Dependence on respirator [ventilator] status: Secondary | ICD-10-CM | POA: Diagnosis not present

## 2023-07-14 DIAGNOSIS — Z515 Encounter for palliative care: Secondary | ICD-10-CM | POA: Diagnosis not present

## 2023-07-14 DIAGNOSIS — E870 Hyperosmolality and hypernatremia: Secondary | ICD-10-CM | POA: Diagnosis not present

## 2023-07-14 LAB — TYPE AND SCREEN
ABO/RH(D): A POS
Antibody Screen: NEGATIVE
Unit division: 0
Unit division: 0

## 2023-07-14 LAB — BASIC METABOLIC PANEL WITH GFR
Anion gap: 12 (ref 5–15)
BUN: 103 mg/dL — ABNORMAL HIGH (ref 8–23)
CO2: 25 mmol/L (ref 22–32)
Calcium: 8.4 mg/dL — ABNORMAL LOW (ref 8.9–10.3)
Chloride: 114 mmol/L — ABNORMAL HIGH (ref 98–111)
Creatinine, Ser: 1.7 mg/dL — ABNORMAL HIGH (ref 0.61–1.24)
GFR, Estimated: 41 mL/min — ABNORMAL LOW (ref >60)
Glucose, Bld: 106 mg/dL — ABNORMAL HIGH (ref 70–99)
Potassium: 4.2 mmol/L (ref 3.5–5.1)
Sodium: 151 mmol/L — ABNORMAL HIGH (ref 135–145)

## 2023-07-14 LAB — CBC
HCT: 24.8 % — ABNORMAL LOW (ref 39.0–52.0)
Hemoglobin: 7.2 g/dL — ABNORMAL LOW (ref 13.0–17.0)
MCH: 30.3 pg (ref 26.0–34.0)
MCHC: 29 g/dL — ABNORMAL LOW (ref 30.0–36.0)
MCV: 104.2 fL — ABNORMAL HIGH (ref 80.0–100.0)
Platelets: 194 10*3/uL (ref 150–400)
RBC: 2.38 MIL/uL — ABNORMAL LOW (ref 4.22–5.81)
RDW: 20.6 % — ABNORMAL HIGH (ref 11.5–15.5)
WBC: 7.7 10*3/uL (ref 4.0–10.5)
nRBC: 0 % (ref 0.0–0.2)

## 2023-07-14 LAB — MAGNESIUM: Magnesium: 2.8 mg/dL — ABNORMAL HIGH (ref 1.7–2.4)

## 2023-07-14 LAB — CULTURE, BLOOD (ROUTINE X 2): Special Requests: ADEQUATE

## 2023-07-14 LAB — BPAM RBC
Blood Product Expiration Date: 202505242359
Blood Product Expiration Date: 202505242359
ISSUE DATE / TIME: 202505011445
Unit Type and Rh: 6200
Unit Type and Rh: 6200

## 2023-07-14 LAB — RENAL FUNCTION PANEL
Albumin: 1.5 g/dL — ABNORMAL LOW (ref 3.5–5.0)
Anion gap: 10 (ref 5–15)
BUN: 103 mg/dL — ABNORMAL HIGH (ref 8–23)
CO2: 27 mmol/L (ref 22–32)
Calcium: 8.4 mg/dL — ABNORMAL LOW (ref 8.9–10.3)
Chloride: 114 mmol/L — ABNORMAL HIGH (ref 98–111)
Creatinine, Ser: 1.82 mg/dL — ABNORMAL HIGH (ref 0.61–1.24)
GFR, Estimated: 37 mL/min — ABNORMAL LOW (ref >60)
Glucose, Bld: 224 mg/dL — ABNORMAL HIGH (ref 70–99)
Phosphorus: 3.7 mg/dL (ref 2.5–4.6)
Potassium: 4 mmol/L (ref 3.5–5.1)
Sodium: 151 mmol/L — ABNORMAL HIGH (ref 135–145)

## 2023-07-14 LAB — GLUCOSE, CAPILLARY
Glucose-Capillary: 100 mg/dL — ABNORMAL HIGH (ref 70–99)
Glucose-Capillary: 104 mg/dL — ABNORMAL HIGH (ref 70–99)
Glucose-Capillary: 114 mg/dL — ABNORMAL HIGH (ref 70–99)
Glucose-Capillary: 181 mg/dL — ABNORMAL HIGH (ref 70–99)
Glucose-Capillary: 200 mg/dL — ABNORMAL HIGH (ref 70–99)
Glucose-Capillary: 92 mg/dL (ref 70–99)

## 2023-07-14 MED ORDER — FREE WATER
250.0000 mL | Status: DC
  Administered 2023-07-14 – 2023-07-17 (×31): 250 mL

## 2023-07-14 MED ORDER — INSULIN GLARGINE-YFGN 100 UNIT/ML ~~LOC~~ SOLN
22.0000 [IU] | Freq: Every day | SUBCUTANEOUS | Status: DC
  Administered 2023-07-14 – 2023-07-15 (×2): 22 [IU] via SUBCUTANEOUS
  Filled 2023-07-14 (×3): qty 0.22

## 2023-07-14 MED ORDER — FREE WATER: 500.0000 mL | Status: DC

## 2023-07-14 MED ORDER — INSULIN ASPART 100 UNIT/ML IJ SOLN
8.0000 [IU] | INTRAMUSCULAR | Status: DC
  Administered 2023-07-14 – 2023-07-16 (×7): 8 [IU] via SUBCUTANEOUS

## 2023-07-14 NOTE — Progress Notes (Signed)
 NAME:  Divit Haake., MRN:  [id number], DOB:  1945-05-25, LOS: 4 ADMISSION DATE:  07/10/2023, CONSULTATION DATE:  07/10/23 REFERRING MD:  Scarlette Currier, CHIEF COMPLAINT:  abnormal labs   History of Present Illness:  A 78 yr old male patient with hx of SDH (s/p mini crani/evac 12/2022 at [facility] c/b R cerebellar hemorrhage and seizures), multiple aspiration PNA, failed extubation, chronic resp failure requiring trach/PEG, PTSD, DM-2, HTN, CAD, GERD, ? Dementia, Afib (off NOAC), PCM, and anemia of chronic illness. He has been a resident of [facility] since 01/2023 when he was dc from [facility] for SDH/sequelae of. He presented to [facility] ED 5/1 from [facility] for further evaluation of abnormal labs -- noted to have elevated Cr and low hgb. Foley exchanged. Given one unit of PRBC and 1 L NS 0.9% bolus.   On stable vent settings (PRVC 20/510/+5/40%) and without trach complications.  PCCM consulted for evaluation in this setting    Pertinent  Medical History  SDH, seizures (weaned off keppra), multiple aspiration PNA, failed extubation, chronic resp failure requiring trach/PEG, PTSD, DM-2, HTN, CAD, GERD, ? Dementia, Afib (off NOAC), PCM, and anemia of chronic illness Significant Hospital Events: Including procedures, antibiotic start and stop dates in addition to other pertinent events   5/1 1 PRBC for hgb 6.6. PCCM consult  5/1 foley exchanged in the ED here. 5/2 cultures positive for klebsiella with CRE  Interim History / Subjective:  No acute issues overnight.  Wife at bedside is very caring and wants him to improve Stable vent and resp status.    Objective   Blood pressure (!) 143/119, pulse 70, temperature 98.2 F (36.8 C), temperature source Axillary, resp. rate (!) 21, height 5\' 6"  (1.676 m), weight 78.7 kg, SpO2 100%.    Vent Mode: PRVC FiO2 (%):  [40 %] 40 % Set Rate:  [20 bmp] 20 bmp Vt Set:  [510 mL] 510 mL PEEP:  [5 cmH20] 5 cmH20 Plateau Pressure:  [22 cmH20-26 cmH20] 22 cmH20   Intake/Output  Summary (Last 24 hours) at 07/14/2023 0959 Last data filed at 07/14/2023 0600 Gross per 24 hour  Intake 1149.85 ml  Output 2000 ml  Net -850.15 ml   Filed Weights   07/12/23 0500 07/13/23 0500 07/14/23 0500  Weight: 76.3 kg 79.9 kg 78.7 kg    Examination:  General:  chronically ill appearing elderly gentleman on vent via trach Neuro:  Awake, alert, not interactive HEENT: Smiley/AT, PERRL, No JVD Cardiovascular:  RRR, no MRG Lungs:  Tachypnea (rate 33) on PRVC and PSV 10/5. Coarse bilaterally.  Abdomen:  Soft, non-distended, non-tender.  Musculoskeletal:  No acute deformity. Bilateral remote toe amputations.  Skin:  Intact, MMM   Resolved Hospital Problem list     Assessment & Plan:  Chronic respiratory failure Trach/vent dependence  Severe sepsis secondary to CRE Klebsiella UTI with bacteremia AKI/?CKD:  H/o SDH (s/p mini crani/evac 12/2022 at [facility] c/b R cerebellar hemorrhage and seizures)  Acute  metabolic encephalopathy DM-2 w/ hyperglycemia: A little worse on 5/4 HTN, CAD, s/p CABG, S-CHD (EF at [facility] 44%)   Afib (off NOAC) GERD Severe PCM Acute anemia on chronic anemia of chronic illness: Intermittent fluid and electrolyte imbalance: hypernatremia, hyperchloremia:   Pulm prob list Chronic respiratory failure w/ Trach/vent dependence,.  Complicated by VAP suspect CRE Klebsiella  Discussion: Prolonged course all stemming from SDH last fall. Remains trach/vent dependent. His wife is hopeful he can recover, but understands at this point it is very unlikely. She asked  us  to try to wean him, which I did. Volumes were OK but rate was in the 30s (was in the 30s on PRVC as well) so maybe not the best time to wean. He is so very deconditioned at this point in addition to his recurrent pneumonias and aspiration risk it is very unlikely he will recover.   Plan  Continuing full ventilator support. Can attempt to wean at the request of the wife when he is more comfortable and less  tachypneic. Continue current antimicrobial therapy, He is currently on Avycaz , as directed by infectious disease VAP bundle His wife is contemplating goals of care. Appreciate the care of the palliative medicine team.  In regards to tracheostomy currently has a size 6 cuffed at bedside, the outside diameter slightly larger than his Portex, this could make placement difficult.  A backup tracheostomy has been ordered, we will also place a size 4 cuffed trach at bedside which could act as a placeholder should we need to dilate    All other issues per IM service  Best Practice (right click and "Reselect all SmartList Selections" daily)   Per primary. PCCM will follow going forward for vent management.   Roz Cornelia, AGACNP-BC [facility]  See Amion for personal pager PCCM on call pager [phone] until 7pm. Please call Elink 7p-7a. [phone]  07/14/2023 10:36 AM

## 2023-07-14 NOTE — TOC Progression Note (Addendum)
 Transition of Care (TOC) - Progression Note    Patient Details  Name: Albert Vance. MRN: [id number] Date of Birth: 05-Sep-1945  Transition of Care [facility]) CM/SW Contact  Katrinka Parr, [place] Phone Number: 07/14/2023, 1:27 PM  Clinical Narrative:     Voicemail left for Landa Pine in [facility] [phone] requesting return call.    1600: CSW called [facility], [phone]; phone continued to ring with no answer.      Post Acute Care Choice: Skilled Nursing Facility Living arrangements for the past 2 months:  (admitted from LT[facility])                                       Social Determinants of Health (SDOH) Interventions SDOH Screenings   Transportation Needs: Unknown (01/01/2023)   Received from [facility]  Tobacco Use: Medium Risk (12/31/2022)   Received from [facility]    Readmission Risk Interventions     No data to display

## 2023-07-14 NOTE — Progress Notes (Signed)
 [facility]  Date of Admission:  07/10/2023     Reason for Follow Up: Hypernatremia  Total days of antibiotics 5         ASSESSMENT:  Mr. Aldama PICC line was removed on 07/11/2023 in the setting of carbapenem resistant Klebsiella pneumonia and subsequent finding of Pseudomonas on respiratory culture which is likely a colonizer.  Source of infection likely catheter related and source control appears to be achieved.  Blood cultures from 07/13/2023 without growth to date.  Discussed plan of care to continue current dose of ceftazidime -avibactam for a total of 2 weeks from line removal.  Continue contact precautions for carbapenem resistant organism.  Reminder to throw all tube feeds in the trash and nothing into the sink that may colonized sink trap.  Palliative care with ongoing goals of care discussions.  Continue to monitor cultures for clearance of bacteremia.  Remaining medical and supportive care per internal medicine.   PLAN:  Continue current dose of ceftazidime -avibactam. Monitor blood cultures for clearance of bacteremia. Continue contact precautions for carbapenem resistant organism and dispose of all tube feeds in trash nothing into the sink that may colonized sink trap. Continue goals of care discussions per palliative care. Remaining medical and supportive care per internal medicine.  Principal Problem:   Hypernatremia Active Problems:   Carbapenem-resistant bacterial infection   AKI (acute kidney injury) (HCC)   Ventilator dependent (HCC)   Symptomatic anemia   Anasarca   Pressure injury of skin of left heel   Hypoalbuminemia   Klebsiella sepsis (HCC)    amantadine   100 mg Per Tube Daily   amLODipine   10 mg Per Tube Daily   brimonidine   1 drop Both Eyes Q8H   Chlorhexidine  Gluconate Cloth  6 each Topical Daily   enoxaparin (LOVENOX) injection  40 mg Subcutaneous Daily   feeding supplement (PROSource TF20)  60 mL Per Tube Daily   free  water  400 mL Per Tube Q4H   insulin  aspart  0-15 Units Subcutaneous Q4H   insulin  aspart  8 Units Subcutaneous Q4H   insulin  glargine-yfgn  22 Units Subcutaneous Daily   ipratropium-albuterol   3 mL Nebulization Q6H   modafinil   100 mg Per Tube Daily   mupirocin  ointment  1 Application Nasal BID   nutrition supplement (JUVEN)  1 packet Per Tube BID BM   mouth rinse  15 mL Mouth Rinse Q2H   pantoprazole  (PROTONIX ) IV  40 mg Intravenous Daily   polyvinyl alcohol   1 drop Both Eyes Q4H    SUBJECTIVE:  Afebrile overnight with no acute events.  Appears to be tolerating antibiotics with no adverse side effects.  Wife at bedside.  Allergies  Allergen Reactions   Empagliflozin Other (See Comments)    Urinary tract infections   Codeine Other (See Comments)    Unknown    Gabapentin Other (See Comments)    Allergy not listed on MAR   Lisinopril Other (See Comments)    Unknown    Metformin And Related Other (See Comments)    Unknown      Review of Systems: Review of Systems  Unable to perform ROS: Other      OBJECTIVE: Vitals:   07/14/23 0737 07/14/23 0739 07/14/23 1112 07/14/23 1119  BP:      Pulse:  70 77   Resp:  (!) 21 (!) 30   Temp:    98 F (36.7 C)  TempSrc:    Axillary  SpO2: 98% 100%  99%   Weight:      Height:       Body mass index is 28 kg/m.  Physical Exam Constitutional:      General: He is sleeping. He is not in acute distress.    Appearance: He is well-developed.     Comments: Lying in bed with head of bed elevated; sleeping.  Cardiovascular:     Rate and Rhythm: Normal rate and regular rhythm.     Heart sounds: Normal heart sounds.  Pulmonary:     Effort: Pulmonary effort is normal.     Breath sounds: Normal breath sounds.     Comments: Trach in place Skin:    General: Skin is warm and dry.  Neurological:     Mental Status: He is oriented to person, place, and time.  Psychiatric:        Behavior: Behavior normal.        Thought Content:  Thought content normal.        Judgment: Judgment normal.     Lab Results Lab Results  Component Value Date   WBC 7.7 07/14/2023   HGB 7.2 (L) 07/14/2023   HCT 24.8 (L) 07/14/2023   MCV 104.2 (H) 07/14/2023   PLT 194 07/14/2023    Lab Results  Component Value Date   CREATININE 1.82 (H) 07/14/2023   BUN 103 (H) 07/14/2023   NA 151 (H) 07/14/2023   K 4.0 07/14/2023   CL 114 (H) 07/14/2023   CO2 27 07/14/2023    Lab Results  Component Value Date   ALT 23 07/10/2023   AST 18 07/10/2023   ALKPHOS 231 (H) 07/10/2023   BILITOT 0.9 07/10/2023     Microbiology: Recent Results (from the past 240 hours)  MRSA Next Gen by PCR, Nasal     Status: Abnormal   Collection Time: 07/10/23  5:39 PM   Specimen: Nasal Mucosa; Nasal Swab  Result Value Ref Range Status   MRSA by PCR Next Gen DETECTED (A) NOT DETECTED Final    Comment: RESULT CALLED TO, READ BACK BY AND VERIFIED WITH: [id number] AT 1938 ADC TO RN Lus Salter W. (NOTE) The GeneXpert MRSA Assay (FDA approved for NASAL specimens only), is one component of a comprehensive MRSA colonization surveillance program. It is not intended to diagnose MRSA infection nor to guide or monitor treatment for MRSA infections. Test performance is not FDA approved in patients less than 38 years old. Performed at [facility], [address], [place], [place] [address]   Culture, blood (Routine X 2) w Reflex to ID Panel     Status: Abnormal   Collection Time: 07/10/23  6:32 PM   Specimen: BLOOD  Result Value Ref Range Status   Specimen Description BLOOD SITE NOT SPECIFIED  Final   Special Requests   Final    BOTTLES DRAWN AEROBIC AND ANAEROBIC Blood Culture adequate volume   Culture  Setup Time   Final    GRAM NEGATIVE RODS IN BOTH AEROBIC AND ANAEROBIC BOTTLES CRITICAL RESULT CALLED TO, READ BACK BY AND VERIFIED WITH: PHARMD V BRYK 07/11/2023 @ 0700 BY AB    Culture (A)  Final    KLEBSIELLA PNEUMONIAE CONFIRMED CARBAPENEMASE RESISTANT  ENTEROBACTERIACAE CRITICAL RESULT CALLED TO, READ BACK BY AND VERIFIED WITH: RN Maeola Schmidt [id number] BY Chestine Costain, [place] [facility] NOTIFIED Performed at [facility], [address], [place], [place] [address]    Report Status 07/14/2023 FINAL  Final   Organism ID, Bacteria  KLEBSIELLA PNEUMONIAE  Final      Susceptibility   Klebsiella pneumoniae - MIC*    AMPICILLIN >=32 RESISTANT Resistant     CEFEPIME >=32 RESISTANT Resistant     CEFTAZIDIME  >=64 RESISTANT Resistant     CEFTRIAXONE  >=64 RESISTANT Resistant     CIPROFLOXACIN >=4 RESISTANT Resistant     GENTAMICIN 8 INTERMEDIATE Intermediate     IMIPENEM 8 INTERMEDIATE Intermediate     TRIMETH/SULFA >=320 RESISTANT Resistant     AMPICILLIN/SULBACTAM >=32 RESISTANT Resistant     PIP/TAZO >=128 RESISTANT Resistant ug/mL    * KLEBSIELLA PNEUMONIAE  Culture, blood (Routine X 2) w Reflex to ID Panel     Status: Abnormal   Collection Time: 07/10/23  6:32 PM   Specimen: BLOOD  Result Value Ref Range Status   Specimen Description BLOOD SITE NOT SPECIFIED  Final   Special Requests   Final    BOTTLES DRAWN AEROBIC AND ANAEROBIC Blood Culture adequate volume   Culture  Setup Time   Final    GRAM NEGATIVE RODS IN BOTH AEROBIC AND ANAEROBIC BOTTLES CRITICAL VALUE NOTED.  VALUE IS CONSISTENT WITH PREVIOUSLY REPORTED AND CALLED VALUE.    Culture (A)  Final    KLEBSIELLA PNEUMONIAE SUSCEPTIBILITIES PERFORMED ON PREVIOUS CULTURE WITHIN THE LAST 5 DAYS. Performed at [facility], [address], [place], [place] [address]    Report Status 07/14/2023 FINAL  Final  Blood Culture ID Panel (Reflexed)     Status: Abnormal   Collection Time: 07/10/23  6:32 PM  Result Value Ref Range Status   Enterococcus faecalis NOT DETECTED NOT DETECTED Final   Enterococcus Faecium NOT DETECTED NOT DETECTED Final   Listeria monocytogenes NOT DETECTED NOT DETECTED Final   Staphylococcus species NOT DETECTED NOT DETECTED Final   Staphylococcus  aureus (BCID) NOT DETECTED NOT DETECTED Final   Staphylococcus epidermidis NOT DETECTED NOT DETECTED Final   Staphylococcus lugdunensis NOT DETECTED NOT DETECTED Final   Streptococcus species NOT DETECTED NOT DETECTED Final   Streptococcus agalactiae NOT DETECTED NOT DETECTED Final   Streptococcus pneumoniae NOT DETECTED NOT DETECTED Final   Streptococcus pyogenes NOT DETECTED NOT DETECTED Final   A.calcoaceticus-baumannii NOT DETECTED NOT DETECTED Final   Bacteroides fragilis NOT DETECTED NOT DETECTED Final   Enterobacterales DETECTED (A) NOT DETECTED Final    Comment: Enterobacterales represent a large order of gram negative bacteria, not a single organism. CRITICAL RESULT CALLED TO, READ BACK BY AND VERIFIED WITH: PHARMD V BRYK 07/11/2023 @ 0700 BY AB    Enterobacter cloacae complex NOT DETECTED NOT DETECTED Final   Escherichia coli NOT DETECTED NOT DETECTED Final   Klebsiella aerogenes NOT DETECTED NOT DETECTED Final   Klebsiella oxytoca NOT DETECTED NOT DETECTED Final   Klebsiella pneumoniae DETECTED (A) NOT DETECTED Final    Comment: CRITICAL RESULT CALLED TO, READ BACK BY AND VERIFIED WITH: PHARMD V BRYK 07/11/2023 @ 0700 BY AB    Proteus species NOT DETECTED NOT DETECTED Final   Salmonella species NOT DETECTED NOT DETECTED Final   Serratia marcescens NOT DETECTED NOT DETECTED Final   Haemophilus influenzae NOT DETECTED NOT DETECTED Final   Neisseria meningitidis NOT DETECTED NOT DETECTED Final   Pseudomonas aeruginosa NOT DETECTED NOT DETECTED Final   Stenotrophomonas maltophilia NOT DETECTED NOT DETECTED Final   Candida albicans NOT DETECTED NOT DETECTED Final   Candida auris NOT DETECTED NOT DETECTED Final   Candida glabrata NOT DETECTED NOT DETECTED Final   Candida krusei NOT DETECTED NOT DETECTED Final  Candida parapsilosis NOT DETECTED NOT DETECTED Final   Candida tropicalis NOT DETECTED NOT DETECTED Final   Cryptococcus neoformans/gattii NOT DETECTED NOT DETECTED Final    CTX-M ESBL NOT DETECTED NOT DETECTED Final   Carbapenem resistance IMP NOT DETECTED NOT DETECTED Final   Carbapenem resistance KPC DETECTED (A) NOT DETECTED Final    Comment: CRITICAL RESULT CALLED TO, READ BACK BY AND VERIFIED WITH: PHARMD V BRYK 07/11/2023 @ 0700 BY AB (NOTE) Carbapenemase resistant enterobacterales detected. Recommend ceftazidime /avibactam as intitial therapy.     Carbapenem resistance NDM NOT DETECTED NOT DETECTED Final   Carbapenem resist OXA 48 LIKE NOT DETECTED NOT DETECTED Final   Carbapenem resistance VIM NOT DETECTED NOT DETECTED Final    Comment: Performed at [facility], [address], [place], [place] [address]  Carbapenem Resistance Panel     Status: Abnormal   Collection Time: 07/10/23  6:32 PM  Result Value Ref Range Status   Carba Resistance IMP Gene NOT DETECTED NOT DETECTED Final   Carba Resistance VIM Gene NOT DETECTED NOT DETECTED Final   Carba Resistance NDM Gene NOT DETECTED NOT DETECTED Final   Carba Resistance KPC Gene DETECTED (A) NOT DETECTED Final    Comment: CRITICAL RESULT CALLED TO, READ BACK BY AND VERIFIED WITH: RN E REYNA [id number] BY J RAZZAK, [place]    Carba Resistance OXA48 Gene NOT DETECTED NOT DETECTED Final    Comment: (NOTE) Cepheid Carba-R is an FDA-cleared nucleic acid amplification test  (NAAT)for the detection and differentiation of genes encoding the  most prevalent carbapenemases in bacterial isolate samples. Carbapenemase gene identification and implementation of comprehensive  infection control measures are recommended by the CDC to prevent the  spread of the resistant organisms. Performed at [facility], [address] [place], [place] [address]   Culture, Respiratory w Gram Stain     Status: None (Preliminary result)   Collection Time: 07/12/23  9:37 AM   Specimen: Tracheal Aspirate; Respiratory  Result Value Ref Range Status   Specimen Description TRACHEAL ASPIRATE  Final   Special Requests  NONE  Final   Gram Stain   Final    NO SQUAMOUS EPITHELIAL CELLS SEEN WBC PRESENT, PREDOMINANTLY PMN ABUNDANT GRAM NEGATIVE RODS    Culture   Final    MODERATE PSEUDOMONAS AERUGINOSA MODERATE GRAM NEGATIVE RODS CULTURE REINCUBATED FOR BETTER GROWTH Performed at [facility], [address], [place], [place] [address]    Report Status PENDING  Incomplete   Organism ID, Bacteria PSEUDOMONAS AERUGINOSA  Final      Susceptibility   Pseudomonas aeruginosa - MIC*    CEFTAZIDIME  4 SENSITIVE Sensitive     CIPROFLOXACIN <=0.25 SENSITIVE Sensitive     GENTAMICIN <=1 SENSITIVE Sensitive     IMIPENEM 2 SENSITIVE Sensitive     CEFEPIME 2 SENSITIVE Sensitive     * MODERATE PSEUDOMONAS AERUGINOSA  Culture, blood (Routine X 2) w Reflex to ID Panel     Status: None (Preliminary result)   Collection Time: 07/13/23 11:48 AM   Specimen: BLOOD LEFT HAND  Result Value Ref Range Status   Specimen Description BLOOD LEFT HAND  Final   Special Requests   Final    BOTTLES DRAWN AEROBIC AND ANAEROBIC Blood Culture adequate volume   Culture   Final    NO GROWTH < 24 HOURS Performed at [facility], [address], [place], [place] [address]    Report Status PENDING  Incomplete  Culture, blood (  Routine X 2) w Reflex to ID Panel     Status: None (Preliminary result)   Collection Time: 07/13/23 12:04 PM   Specimen: BLOOD RIGHT HAND  Result Value Ref Range Status   Specimen Description BLOOD RIGHT HAND  Final   Special Requests   Final    BOTTLES DRAWN AEROBIC AND ANAEROBIC Blood Culture adequate volume   Culture   Final    NO GROWTH < 24 HOURS Performed at [facility], [address] [place], [place] [address]    Report Status PENDING  Incomplete     Marlan Silva, NP [facility]  07/14/2023  12:47 PM

## 2023-07-14 NOTE — Progress Notes (Signed)
 Patient ID: Aisha Hove., male   DOB: 1945/06/08, 78 y.o.   MRN: [id number]    Progress Note from the Palliative Medicine Team at [facility]   Patient Name: Albert Vance.        Date: 07/14/2023 DOB: 10/14/1945  Age: 78 y.o. MRN#: [id number] Attending Physician: Bevelyn Bryant, MD Primary Care Physician: Jerral Moos, MD Admit Date: 07/10/2023   Reason for Consultation/Follow-up   Establishing Goals of Care   HPI/ Brief Hospital Review  78 y.o. 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 Found to be septic due to CRE klebsiella UTI and bacteremia with AKI.   Family face treatment option decisions, advanced directive decisions and anticipatory care needs.  Subjective  Extensive chart review has been completed prior to meeting with patient/family  including labs, vital signs, imaging, progress/consult notes, orders, medications and available advance directive documents.    This NP assessed patient at the bedside and spoke to patient's wife for ongoing conversation regarding goals of care and treatment plan, and to assess for palliative medicine needs and emotional support.  Education offered on the seriousness of patient's prolonged hospitalizations and SNF stays over the past 6 months...  He has had a continued physical, functional and cognitive decline over the past many months according to his wife  Education offered on concept of human mortality and adult failure to thrive.  Education offered on the limitations of medical interventions to prolong quality of life when the body does fail to thrive.  I shared my concern for his high risk for ongoing decompensation in spite of  full medical support.      Wife verbalizes understanding of the seriousness but  verbalizes her strong faith and belief that ultimately all things are in God's hands.  She continues to hope for improvement. She tells me " he has come through so many hard situations".   She is tearful.   Education offered on the difference between a full medical support path and a palliative comfort path for this patient, at this time in this situation.  She remains open to all offered and available medical interventions to prolong life.  Education offered today regarding  the importance of continued conversation with family and their  medical providers regarding overall plan of care and treatment options,  ensuring decisions are within the context of the patients values and GOCs.  Questions and concerns addressed   Discussed with primary team and nursing staff   Time: 50 minutes  Detailed review of medical records ( labs, imaging, vital signs), medically appropriate exam ( MS, skin, cardiac,  resp)   discussed with treatment team, counseling and education to patient, family, staff, documenting clinical information, medication management, coordination of care    Thena Fireman NP  Palliative Medicine Team Team Phone # [phone] Pager [phone]

## 2023-07-14 NOTE — Plan of Care (Signed)
  Problem: Activity: Goal: Ability to tolerate increased activity will improve Outcome: Progressing   Problem: Respiratory: Goal: Ability to maintain a clear airway and adequate ventilation will improve Outcome: Progressing   Problem: Role Relationship: Goal: Method of communication will improve Outcome: Progressing   Problem: Education: Goal: Knowledge of General Education information will improve Description: Including pain rating scale, medication(s)/side effects and non-pharmacologic comfort measures Outcome: Progressing   Problem: Clinical Measurements: Goal: Will remain free from infection Outcome: Progressing Goal: Diagnostic test results will improve Outcome: Progressing Goal: Respiratory complications will improve Outcome: Progressing Goal: Cardiovascular complication will be avoided Outcome: Progressing   Problem: Activity: Goal: Risk for activity intolerance will decrease Outcome: Progressing   Problem: Nutrition: Goal: Adequate nutrition will be maintained Outcome: Progressing   Problem: Coping: Goal: Level of anxiety will decrease Outcome: Progressing   Problem: Elimination: Goal: Will not experience complications related to bowel motility Outcome: Progressing Goal: Will not experience complications related to urinary retention Outcome: Progressing   Problem: Pain Managment: Goal: General experience of comfort will improve and/or be controlled Outcome: Progressing   Problem: Safety: Goal: Ability to remain free from injury will improve Outcome: Progressing   Problem: Skin Integrity: Goal: Risk for impaired skin integrity will decrease Outcome: Progressing

## 2023-07-14 NOTE — Progress Notes (Signed)
 Subjective Tracks with eyes. Does not appear to be pain.   Physical exam Blood pressure (!) 143/119, pulse 70, temperature 98.2 F (36.8 C), temperature source Axillary, resp. rate (!) 21, height 5\' 6"  (1.676 m), weight 78.7 kg, SpO2 100%.  Constitutional: chronically ill-appearing, lying in bed on ventilator, in no acute distress Cardiovascular: regular rate and rhythm, no m/r/g Pulmonary/Chest: ventilator sounds to anterior auscultation, coarse Neurological: opens eyes on command, tracks Skin: warm and dry   Weight change: -1.2 kg   Intake/Output Summary (Last 24 hours) at 07/14/2023 1046 Last data filed at 07/14/2023 0600 Gross per 24 hour  Intake 1099.85 ml  Output 2000 ml  Net -900.15 ml   Net IO Since Admission: -413.46 mL [07/14/23 1046]  Labs, images, and other studies    Latest Ref Rng & Units 07/14/2023    2:52 AM 07/13/2023    8:16 AM 07/12/2023    3:07 AM  CBC  WBC 4.0 - 10.5 K/uL 7.7  8.4  8.1   Hemoglobin 13.0 - 17.0 g/dL 7.2  7.6  7.4   Hematocrit 39.0 - 52.0 % 24.8  25.9  25.3   Platelets 150 - 400 K/uL 194  188  164        Latest Ref Rng & Units 07/14/2023    2:53 AM 07/13/2023    8:16 AM 07/12/2023    8:10 PM  BMP  Glucose 70 - 99 mg/dL 161  096  045   BUN 8 - 23 mg/dL 409  811  914   Creatinine 0.61 - 1.24 mg/dL 7.82  9.56  2.13   Sodium 135 - 145 mmol/L 151  151  152   Potassium 3.5 - 5.1 mmol/L 4.0  4.0  3.9   Chloride 98 - 111 mmol/L 114  114  115   CO2 22 - 32 mmol/L 27  26  26    Calcium 8.9 - 10.3 mg/dL 8.4  8.3  8.4      Assessment and plan Hospital day 4  Ludger R Nuncio Rahill. is a 78 y.o.male chronic respiratory failure with tracheostomy since 2014, T2DM, CAD, atrial fibrillation, and anemia of chronic illness who is admitted from [facility] for sepsis due to CR Klebsiella UTI and bacteremia with AKI.   Klebsiella Bacteremia  Pulmonary Edema vs Infiltrates Chronic Respiratory Failure w/ Trach and Vent Dependence   Appreciate ID and PCCM recommendations. Source thought to be VAP vs indwelling catheter vs PICC. Catheter has been replaced and PICC line has been pulled for line holiday. Remains afebrile without leukocytosis. Preliminary repeat BC with NGTD.  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 Likely in the setting of hypovolemia and decreased albumin with third spacing. Creatinine improving to 1.82. Unclear if 400 ml q 4 of free water was given as pump may have been set to manual per RN. This is now changed to scheduled. Will check afternoon BMP and plan accordingly. Also reached out to RD for further tube feed recommendations given anasarca/hypoalbuminemia.    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 Trend.   Hx of Atrial Fibrillation  Not a candidate for anticoagulation given history of SDH. Currently NSR.  -Continue cardiac monitoring   T2DM  CBG's above goal. PCCM increased basal to 22 units and novolog  to 8 units q 4 in  addition to SSI.   Goals of Care Remains full code. Patient's wife is primary Management consultant and met with palliative care bedside. Still weighing options. She does not want him to go back to [facility] so will reach out to [facility] to discuss options.   [name], DO 07/14/2023, 10:46 AM  Pager: [phone] After 5pm or weekend: [phone]

## 2023-07-15 ENCOUNTER — Encounter (HOSPITAL_COMMUNITY): Payer: Self-pay | Admitting: Infectious Diseases

## 2023-07-15 DIAGNOSIS — E8809 Other disorders of plasma-protein metabolism, not elsewhere classified: Secondary | ICD-10-CM | POA: Diagnosis not present

## 2023-07-15 DIAGNOSIS — N179 Acute kidney failure, unspecified: Secondary | ICD-10-CM | POA: Diagnosis not present

## 2023-07-15 DIAGNOSIS — J9611 Chronic respiratory failure with hypoxia: Secondary | ICD-10-CM | POA: Diagnosis not present

## 2023-07-15 DIAGNOSIS — R601 Generalized edema: Secondary | ICD-10-CM | POA: Diagnosis not present

## 2023-07-15 DIAGNOSIS — Z9911 Dependence on respirator [ventilator] status: Secondary | ICD-10-CM | POA: Diagnosis not present

## 2023-07-15 DIAGNOSIS — E870 Hyperosmolality and hypernatremia: Secondary | ICD-10-CM | POA: Diagnosis not present

## 2023-07-15 DIAGNOSIS — Z930 Tracheostomy status: Secondary | ICD-10-CM | POA: Diagnosis not present

## 2023-07-15 DIAGNOSIS — Z7189 Other specified counseling: Secondary | ICD-10-CM

## 2023-07-15 LAB — CULTURE, RESPIRATORY W GRAM STAIN: Gram Stain: NONE SEEN

## 2023-07-15 LAB — BASIC METABOLIC PANEL WITH GFR
Anion gap: 10 (ref 5–15)
Anion gap: 7 (ref 5–15)
BUN: 102 mg/dL — ABNORMAL HIGH (ref 8–23)
BUN: 97 mg/dL — ABNORMAL HIGH (ref 8–23)
CO2: 25 mmol/L (ref 22–32)
CO2: 27 mmol/L (ref 22–32)
Calcium: 8.4 mg/dL — ABNORMAL LOW (ref 8.9–10.3)
Calcium: 8.2 mg/dL — ABNORMAL LOW (ref 8.9–10.3)
Chloride: 113 mmol/L — ABNORMAL HIGH (ref 98–111)
Chloride: 114 mmol/L — ABNORMAL HIGH (ref 98–111)
Creatinine, Ser: 1.62 mg/dL — ABNORMAL HIGH (ref 0.61–1.24)
Creatinine, Ser: 1.55 mg/dL — ABNORMAL HIGH (ref 0.61–1.24)
GFR, Estimated: 43 mL/min — ABNORMAL LOW (ref >60)
GFR, Estimated: 45 mL/min — ABNORMAL LOW (ref >60)
Glucose, Bld: 178 mg/dL — ABNORMAL HIGH (ref 70–99)
Glucose, Bld: 92 mg/dL (ref 70–99)
Potassium: 4.6 mmol/L (ref 3.5–5.1)
Potassium: 4.3 mmol/L (ref 3.5–5.1)
Sodium: 148 mmol/L — ABNORMAL HIGH (ref 135–145)
Sodium: 148 mmol/L — ABNORMAL HIGH (ref 135–145)

## 2023-07-15 LAB — CBC
HCT: 24.7 % — ABNORMAL LOW (ref 39.0–52.0)
Hemoglobin: 7.2 g/dL — ABNORMAL LOW (ref 13.0–17.0)
MCH: 30.4 pg (ref 26.0–34.0)
MCHC: 29.1 g/dL — ABNORMAL LOW (ref 30.0–36.0)
MCV: 104.2 fL — ABNORMAL HIGH (ref 80.0–100.0)
Platelets: 177 10*3/uL (ref 150–400)
RBC: 2.37 MIL/uL — ABNORMAL LOW (ref 4.22–5.81)
RDW: 20.5 % — ABNORMAL HIGH (ref 11.5–15.5)
WBC: 7.6 10*3/uL (ref 4.0–10.5)
nRBC: 0 % (ref 0.0–0.2)

## 2023-07-15 LAB — GLUCOSE, CAPILLARY
Glucose-Capillary: 208 mg/dL — ABNORMAL HIGH (ref 70–99)
Glucose-Capillary: 158 mg/dL — ABNORMAL HIGH (ref 70–99)
Glucose-Capillary: 90 mg/dL (ref 70–99)
Glucose-Capillary: 105 mg/dL — ABNORMAL HIGH (ref 70–99)
Glucose-Capillary: 182 mg/dL — ABNORMAL HIGH (ref 70–99)
Glucose-Capillary: 143 mg/dL — ABNORMAL HIGH (ref 70–99)

## 2023-07-15 MED ORDER — ALBUMIN HUMAN 25 % IV SOLN
25.0000 g | Freq: Once | INTRAVENOUS | Status: AC
  Administered 2023-07-15: 25 g via INTRAVENOUS
  Filled 2023-07-15: qty 100

## 2023-07-15 MED ORDER — FUROSEMIDE 10 MG/ML IJ SOLN
40.0000 mg | Freq: Once | INTRAMUSCULAR | Status: AC
  Administered 2023-07-15: 40 mg via INTRAVENOUS
  Filled 2023-07-15: qty 4

## 2023-07-15 MED ORDER — CHLORHEXIDINE GLUCONATE CLOTH 2 % EX PADS
6.0000 | MEDICATED_PAD | Freq: Every day | CUTANEOUS | Status: DC
  Administered 2023-07-15 – 2023-07-30 (×16): 6 via TOPICAL

## 2023-07-15 MED ORDER — PROSOURCE TF20 ENFIT COMPATIBL EN LIQD
60.0000 mL | Freq: Two times a day (BID) | ENTERAL | Status: DC
  Administered 2023-07-15 – 2023-07-30 (×29): 60 mL
  Filled 2023-07-15 (×29): qty 60

## 2023-07-15 MED ORDER — OSMOLITE 1.5 CAL PO LIQD
1000.0000 mL | ORAL | Status: DC
  Administered 2023-07-15 – 2023-07-29 (×13): 1000 mL
  Filled 2023-07-15 (×4): qty 1000

## 2023-07-15 MED ORDER — IPRATROPIUM-ALBUTEROL 0.5-2.5 (3) MG/3ML IN SOLN
3.0000 mL | Freq: Three times a day (TID) | RESPIRATORY_TRACT | Status: DC
  Administered 2023-07-15 – 2023-07-17 (×7): 3 mL via RESPIRATORY_TRACT
  Filled 2023-07-15 (×7): qty 3

## 2023-07-15 NOTE — Progress Notes (Addendum)
 Subjective Tracks with eyes. Wife at bedside states he has been calm.  Physical exam Blood pressure (!) 147/118, pulse 72, temperature 97.8 F (36.6 C), temperature source Oral, resp. rate (!) 35, height 5\' 6"  (1.676 m), weight 79 kg, SpO2 96%.  Constitutional: chronically ill-appearing, lying in bed on ventilator, in no acute distress Cardiovascular: regular rate and rhythm, no m/r/g, anasarca Pulmonary/Chest: ventilator sounds to anterior auscultation, coarse Neurological: opens eyes on command, tracks Skin: warm and dry  Weight change: 0.3 kg   Intake/Output Summary (Last 24 hours) at 07/15/2023 1315 Last data filed at 07/15/2023 1200 Gross per 24 hour  Intake 3277.64 ml  Output 2650 ml  Net 627.64 ml   Net IO Since Admission: 214.18 mL [07/15/23 1315]  Labs, images, and other studies    Latest Ref Rng & Units 07/15/2023    2:52 AM 07/14/2023    2:52 AM 07/13/2023    8:16 AM  CBC  WBC 4.0 - 10.5 K/uL 7.6  7.7  8.4   Hemoglobin 13.0 - 17.0 g/dL 7.2  7.2  7.6   Hematocrit 39.0 - 52.0 % 24.7  24.8  25.9   Platelets 150 - 400 K/uL 177  194  188        Latest Ref Rng & Units 07/15/2023   12:01 PM 07/15/2023    2:52 AM 07/14/2023    2:50 PM  BMP  Glucose 70 - 99 mg/dL 92  161  096   BUN 8 - 23 mg/dL 97  045  409   Creatinine 0.61 - 1.24 mg/dL 8.11  9.14  7.82   Sodium 135 - 145 mmol/L 148  148  151   Potassium 3.5 - 5.1 mmol/L 4.3  4.6  4.2   Chloride 98 - 111 mmol/L 114  113  114   CO2 22 - 32 mmol/L 27  25  25    Calcium 8.9 - 10.3 mg/dL 8.2  8.4  8.4      Assessment and plan Hospital day 5  Albert Vance. is a 78 y.o.male chronic respiratory failure with tracheostomy since 2014, T2DM, CAD, atrial fibrillation, and anemia of chronic illness who is admitted from [facility] for sepsis due to CR Klebsiella UTI and bacteremia with AKI.    Klebsiella Bacteremia  Pulmonary Edema vs Infiltrates Chronic Respiratory Failure w/ Trach and Vent Dependence   Appreciate ID and PCCM recommendations. Source thought to be VAP vs indwelling catheter vs PICC. Catheter has been replaced and PICC line has been pulled for line holiday. Remains afebrile without leukocytosis. Repeat BC NG 2 days. 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 Will continue free water @ 250 cc q 2 for now. Appreciate RD input. 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 Trend.   Hx of Atrial Fibrillation  Not a candidate for anticoagulation given history of SDH. Currently NSR.  -Continue cardiac monitoring   T2DM  CBG's within goal. Continue basal 22 units and novolog  to 8 units q 4 in addition to SSI.  HTN Stable. Continue Norvasc    Goals of Care Per discussion with wife, Albert Vance, patient is now DNR but full scope of care remains.   Albert Colace, DO 07/15/2023, 1:15 PM  Pager: [phone] After 5pm or weekend: [phone]

## 2023-07-15 NOTE — TOC Progression Note (Addendum)
 Transition of Care (TOC) - Progression Note    Patient Details  Name: Albert Vance. MRN: [id number] Date of Birth: [birth year]  Transition of [facility]) CM/SW Contact  Katrinka Parr, [place] Phone Number: 07/15/2023, 9:27 AM  Clinical Narrative:     [facility] [phone], left voicemail with Landa Pine in admissions requesting return call.   1120: [facility] again; explained CSW has been unable to reach anyone in admissions regarding pt. Secretary provided [employer] cell # [phone]. CSW called this number and left voicemail.   1300: Received voicemail from [facility] requesting additional clinicals be faxed to [phone]  CSW faxed clinicals. Called Nikki back who indicates bed is available but needs her clinical staff to review clinicals prior to giving final confirmation on SNF bed.   Expected Discharge Plan: Skilled Nursing Facility Barriers to Discharge: Continued Medical Work up, SNF Pending bed offer  Expected Discharge Plan and Services     Post Acute Care Choice: Skilled Nursing Facility Living arrangements for the past 2 months:  (admitted from [facility])                                       Social Determinants of Health (SDOH) Interventions SDOH Screenings   Food Insecurity: No Food Insecurity (07/14/2023)  Housing: Low Risk  (07/14/2023)  Transportation Needs: Patient Unable To Answer (07/14/2023)  Utilities: Not At Risk (07/14/2023)  Social Connections: Patient Unable To Answer (07/14/2023)  Tobacco Use: Medium Risk (07/14/2023)    Readmission Risk Interventions     No data to display

## 2023-07-15 NOTE — Progress Notes (Signed)
 Nutrition Follow-up  DOCUMENTATION CODES:  Not applicable  INTERVENTION:  TF via PEG: Decrease Osmolite 1.5 to new goal rate of 40ml/hr ( per day) 60ml ProSource TF20 BID Provides 1780 kcal, 108g protein and free water daily   Free water flushes 250ml q2h (3000ml daily) Total free water (TF+FWF)- per day (Free water deficit 3.1L)  Juven BID per tube to support wound healing   NUTRITION DIAGNOSIS:  Increased nutrient needs related to acute illness, wound healing as evidenced by estimated needs. - remains applicable  GOAL:  Patient will meet greater than or equal to 90% of their needs - goal met via TF  MONITOR:  Vent status, Labs, Weight trends, I & O's, Skin, TF tolerance  REASON FOR ASSESSMENT:  Ventilator, Consult Enteral/tube feeding initiation and management, Wound healing  ASSESSMENT:  Pt admitted from [facility] with abnormal labs (elevated Cr and low hgb). PMH significant for SDH ( s/p mini crani/evac 12/2022 at [facility] c/b cerebellar hemorrhage and seizures), multiple aspiration PNA, chronic respiratory failure requiring trach/PEG, DM2, HTN, CAD, GERD, afib, anemia of chronic illness.  Patient remains intubated on ventilator support MV: 8 L/min Temp (24hrs), Avg:98.1 F (36.7 C), Min:97.4 F (36.3 C), Max:98.9 F (37.2 C)  Per discussion in rounds, pt continues to receive abx treatment for klebsiella with CRE. Continues with vent weaning trials.  CM/SW working on SNF placement.   Pt sleeping at time of visit. Wife and RN present at bedside.   Pt tolerating TF well without nausea, vomiting, diarrhea.  Discussed pt with MD and CCM. Free water flushes increased yesterday to account for ongoing hypernatremia.  Pt continues with edema/anasarca. Received lasix  and albumin today.   Plan to increase protein needs and administration within TF to optimize nutritional status in the setting of acute illness and wound healing.   Admit weight: 79.4  kg Current weight: 79 kg  Drains/lines: UOP: x24 hours PEG  Medications: SSI 0-15 units q4h, SSI 8 units q4h, semglee 22 units daily, protonix   Labs:  Sodium 148 BUN 102 Cr 1.62 GFR 43 CBG's 92-208 x24 hours   Diet Order:   Diet Order             Diet NPO time specified  Diet effective now                   EDUCATION NEEDS:   Education needs have been addressed  Skin:  Skin Assessment: Skin Integrity Issues: Skin Integrity Issues:: Stage II, Unstageable, Stage III Stage II: bilateral buttocks Stage III: L ankle Unstageable: L heel  Last BM:  5/5  Height:   Ht Readings from Last 1 Encounters:  07/10/23 5\' 6"  (1.676 m)    Weight:   Wt Readings from Last 1 Encounters:  07/15/23 79 kg    Ideal Body Weight:  64.5 kg  BMI:  Body mass index is 28.11 kg/m.  Estimated Nutritional Needs:   Kcal:  1700-1900  Protein:  100-115g  Fluid:  >/=1.7L  Rocklin Chute, RDN, LDN Clinical Nutrition See AMiON for contact information.

## 2023-07-15 NOTE — Progress Notes (Signed)
 This chaplain responded to PMT NP-Mary consult for spiritual care in the setting of supporting the Pt. wife.  The Pt. wife-[name] is at the bedside at the time of the chaplain visit. [name] smiles as she talks about the Pt. and shares her willingness to care for the Pt. alongside of accepting God's will.  This chaplain understands Albert Vance has talked to their children about the change of the Pt. code status to DNR. The Pt. daughter, who is a Charity fundraiser, is supporting Albert Vance as needed.   Albert Vance recognizes the importance of caring well for herself and plans to go home tonight in this period of watchful waiting.    This chaplain is available for F/U spiritual care as needed.  Chaplain Kathleene Papas [phone]

## 2023-07-15 NOTE — Progress Notes (Addendum)
 NAME:  Lorence Dusing., MRN:  [id number], DOB:  04-08-1945, LOS: 5 ADMISSION DATE:  07/10/2023, CONSULTATION DATE:  07/10/2023 REFERRING MD:  Scarlette Currier - EDP, CHIEF COMPLAINT: Abnormal labs   History of Present Illness:  A 78 yr old male patient with hx of SDH (s/p mini crani/evac 12/2022 at [facility] c/b R cerebellar hemorrhage and seizures), multiple aspiration PNA, failed extubation, chronic resp failure requiring trach/PEG, PTSD, DM-2, HTN, CAD, GERD, ? Dementia, Afib (off NOAC), PCM, and anemia of chronic illness. He has been a resident of [facility] since 01/2023 when he was dc from [facility] for SDH/sequelae of. He presented to [facility] ED 5/1 from [facility] for further evaluation of abnormal labs -- noted to have elevated Cr and low hgb. Foley exchanged. Given one unit of PRBC and 1 L NS 0.9% bolus.   On stable vent settings (PRVC 20/510/+5/40%) and without trach complications.  PCCM consulted for evaluation in this setting    Pertinent Medical History:  SDH, seizures (weaned off keppra), multiple aspiration PNA, failed extubation, chronic resp failure requiring trach/PEG, PTSD, DM-2, HTN, CAD, GERD, ? Dementia, Afib (off NOAC), PCM, and anemia of chronic illness  Significant Hospital Events: Including procedures, antibiotic start and stop dates in addition to other pertinent events   5/1 1U PRBC for hgb 6.6. PCCM consult Foley exchanged in the ED here. 5/2 Cultures positive for klebsiella with CRE  Interim History / Subjective:  No significant events overnight Wife at bedside, reports patient did not sleep well overnight - sleeping currently States that he mouthed words to her yesterday including "I love you", expressed wanting something to drink Weaning on vent PSV 12/5, FiO2 40% Mild-moderately thick secretions  Objective:  Blood pressure (!) 124/98, pulse 76, temperature (!) 97.4 F (36.3 C), temperature source Axillary, resp. rate 20, height 5\' 6"  (1.676 m), weight 79 kg, SpO2 99%.    Vent  Mode: PRVC FiO2 (%):  [40 %] 40 % Set Rate:  [20 bmp] 20 bmp Vt Set:  [510 mL] 510 mL PEEP:  [5 cmH20] 5 cmH20 Plateau Pressure:  [11 cmH20-21 cmH20] 21 cmH20   Intake/Output Summary (Last 24 hours) at 07/15/2023 0801 Last data filed at 07/15/2023 4098 Gross per 24 hour  Intake 2886.64 ml  Output 2050 ml  Net 836.64 ml   Filed Weights   07/13/23 0500 07/14/23 0500 07/15/23 0500  Weight: 79.9 kg 78.7 kg 79 kg   Physical Examination: General: Chronically ill-appearing elderly man in NAD. Sleeping. HEENT: [place]/AT, anicteric sclera, PERRL, moist mucous membranes. Edentulous. Neuro:  Drowsy, wakes easily with stimulation.  Opens eyes to verbal stimuli. Not following commands. Some spontaneous movement of BUE. Strong +Cough and +Gag  CV: RRR, no m/g/r. PULM: Breathing even and unlabored on vent (PSV 12/5, FiO2 40%). Lung fields clear in upper fields. GI: Soft, nontender, nondistended. Normoactive bowel sounds. Extremities: Bilateral TMAs, well-healed. +Anasarca Skin: Warm/dry, no rashes. Mild scattered ecchymosis of UE.  Resolved Hospital Problem List:    Assessment & Plan:  Chronic respiratory failure Trach/vent dependence  Severe sepsis secondary to CRE Klebsiella UTI with bacteremia AKI/?CKD:  H/o SDH (s/p mini crani/evac 12/2022 at [facility] c/b R cerebellar hemorrhage and seizures)  Acute  metabolic encephalopathy DM-2 w/ hyperglycemia: A little worse on 5/4 HTN, CAD, s/p CABG, S-CHD (EF at [facility] 44%)   Afib (off NOAC) GERD Severe PCM Acute anemia on chronic anemia of chronic illness: Intermittent fluid and electrolyte imbalance: hypernatremia, hyperchloremia:   Pulmonary Problem List: Chronic respiratory failure  w/ Trach/vent dependence, complicated by VAP, suspect CRE Klebsiella Prolonged course all stemming from SDH fall of 2024. Remains trach/vent dependent. His wife is hopeful he can recover, but understands at this point it is very unlikely. Weaning on vent PSV 12/5 today  with RR 20s, tolerating relatively well while sleeping. Wife and I discussed how deconditioning and generalized weakness plays a role in making prolonged weaning/staying off of the ventilator to tolerate ATC very difficult. She asks for continued working with him to try and build his tolerance. - Continue full vent support (4-8cc/kg IBW), weaning as able (PSV 12/5, FiO2 40% this AM 5/6) - Wean FiO2 for O2 sat > 90% - Daily WUA/SBT as tolerated - VAP bundle - Pulmonary hygiene - Albumin/Lasix , monitor I&Os - Trach care per protocol (cuffed Shiley #6, #4 at bedside in the event emergent replacement of Portex is indicated, differences in diameter could make this challenging - backup Portex ordered) - Continue Avycaz  per ID - Appreciate ongoing PMT involvement/GOC discussion  Other issues per Primary Team (IMTS). Wife expresses wishes for [facility] versus another facility, does not want to return to [facility].  Best Practice (right click and "Reselect all SmartList Selections" daily)   Per Primary Team  Signature:   Genoveva [facility] 07/15/23 8:05 AM  Please see [email or web address] for pager details.  From 7A-7P if no response, please call [phone] After hours, please call ELink [phone]

## 2023-07-16 ENCOUNTER — Inpatient Hospital Stay (HOSPITAL_COMMUNITY)

## 2023-07-16 DIAGNOSIS — Z9911 Dependence on respirator [ventilator] status: Secondary | ICD-10-CM | POA: Diagnosis not present

## 2023-07-16 DIAGNOSIS — Z515 Encounter for palliative care: Secondary | ICD-10-CM | POA: Diagnosis not present

## 2023-07-16 DIAGNOSIS — J9611 Chronic respiratory failure with hypoxia: Secondary | ICD-10-CM | POA: Diagnosis not present

## 2023-07-16 DIAGNOSIS — E870 Hyperosmolality and hypernatremia: Secondary | ICD-10-CM | POA: Diagnosis not present

## 2023-07-16 DIAGNOSIS — E8809 Other disorders of plasma-protein metabolism, not elsewhere classified: Secondary | ICD-10-CM | POA: Diagnosis not present

## 2023-07-16 DIAGNOSIS — Z930 Tracheostomy status: Secondary | ICD-10-CM | POA: Diagnosis not present

## 2023-07-16 DIAGNOSIS — R601 Generalized edema: Secondary | ICD-10-CM | POA: Diagnosis not present

## 2023-07-16 DIAGNOSIS — N179 Acute kidney failure, unspecified: Secondary | ICD-10-CM | POA: Diagnosis not present

## 2023-07-16 LAB — BASIC METABOLIC PANEL WITH GFR
Anion gap: 10 (ref 5–15)
BUN: 103 mg/dL — ABNORMAL HIGH (ref 8–23)
CO2: 26 mmol/L (ref 22–32)
Calcium: 8.8 mg/dL — ABNORMAL LOW (ref 8.9–10.3)
Chloride: 110 mmol/L (ref 98–111)
Creatinine, Ser: 1.71 mg/dL — ABNORMAL HIGH (ref 0.61–1.24)
GFR, Estimated: 40 mL/min — ABNORMAL LOW (ref >60)
Glucose, Bld: 96 mg/dL (ref 70–99)
Potassium: 4.3 mmol/L (ref 3.5–5.1)
Sodium: 146 mmol/L — ABNORMAL HIGH (ref 135–145)

## 2023-07-16 LAB — GLUCOSE, CAPILLARY
Glucose-Capillary: 106 mg/dL — ABNORMAL HIGH (ref 70–99)
Glucose-Capillary: 103 mg/dL — ABNORMAL HIGH (ref 70–99)
Glucose-Capillary: 54 mg/dL — ABNORMAL LOW (ref 70–99)
Glucose-Capillary: 106 mg/dL — ABNORMAL HIGH (ref 70–99)
Glucose-Capillary: 121 mg/dL — ABNORMAL HIGH (ref 70–99)
Glucose-Capillary: 72 mg/dL (ref 70–99)
Glucose-Capillary: 99 mg/dL (ref 70–99)

## 2023-07-16 LAB — PHOSPHORUS: Phosphorus: 3.4 mg/dL (ref 2.5–4.6)

## 2023-07-16 LAB — CBC
HCT: 25.9 % — ABNORMAL LOW (ref 39.0–52.0)
Hemoglobin: 7.7 g/dL — ABNORMAL LOW (ref 13.0–17.0)
MCH: 30.4 pg (ref 26.0–34.0)
MCHC: 29.7 g/dL — ABNORMAL LOW (ref 30.0–36.0)
MCV: 102.4 fL — ABNORMAL HIGH (ref 80.0–100.0)
Platelets: 221 10*3/uL (ref 150–400)
RBC: 2.53 MIL/uL — ABNORMAL LOW (ref 4.22–5.81)
RDW: 20.4 % — ABNORMAL HIGH (ref 11.5–15.5)
WBC: 10.7 10*3/uL — ABNORMAL HIGH (ref 4.0–10.5)
nRBC: 0 % (ref 0.0–0.2)

## 2023-07-16 LAB — MAGNESIUM: Magnesium: 2.6 mg/dL — ABNORMAL HIGH (ref 1.7–2.4)

## 2023-07-16 MED ORDER — ORAL CARE MOUTH RINSE: 15.0000 mL | OROMUCOSAL | Status: DC | PRN

## 2023-07-16 MED ORDER — OXYCODONE HCL 5 MG/5ML PO SOLN
2.5000 mg | Freq: Four times a day (QID) | ORAL | Status: DC | PRN
  Administered 2023-07-18 – 2023-07-30 (×17): 5 mg
  Filled 2023-07-16 (×17): qty 5

## 2023-07-16 MED ORDER — SODIUM CHLORIDE 3 % IN NEBU
4.0000 mL | INHALATION_SOLUTION | Freq: Two times a day (BID) | RESPIRATORY_TRACT | Status: AC
  Administered 2023-07-16 – 2023-07-18 (×6): 4 mL via RESPIRATORY_TRACT
  Filled 2023-07-16 (×6): qty 15

## 2023-07-16 MED ORDER — DEXTROSE 50 % IV SOLN: 1.0000 | INTRAVENOUS | Status: AC

## 2023-07-16 MED ORDER — INSULIN GLARGINE-YFGN 100 UNIT/ML ~~LOC~~ SOLN: 20.0000 [IU] | Freq: Every day | SUBCUTANEOUS | Status: DC

## 2023-07-16 MED ORDER — FUROSEMIDE 10 MG/ML IJ SOLN
40.0000 mg | Freq: Once | INTRAMUSCULAR | Status: AC
  Administered 2023-07-16: 40 mg via INTRAVENOUS
  Filled 2023-07-16: qty 4

## 2023-07-16 MED ORDER — INSULIN GLARGINE-YFGN 100 UNIT/ML ~~LOC~~ SOLN
18.0000 [IU] | Freq: Every day | SUBCUTANEOUS | Status: DC
  Administered 2023-07-16 – 2023-07-17 (×2): 18 [IU] via SUBCUTANEOUS
  Filled 2023-07-16 (×3): qty 0.18

## 2023-07-16 MED ORDER — INSULIN ASPART 100 UNIT/ML IJ SOLN
6.0000 [IU] | INTRAMUSCULAR | Status: DC
  Administered 2023-07-16 – 2023-07-17 (×3): 6 [IU] via SUBCUTANEOUS

## 2023-07-16 MED ORDER — ORAL CARE MOUTH RINSE
15.0000 mL | OROMUCOSAL | Status: DC
  Administered 2023-07-16 – 2023-07-30 (×170): 15 mL via OROMUCOSAL

## 2023-07-16 MED ORDER — CLONAZEPAM 0.5 MG PO TABS
0.5000 mg | ORAL_TABLET | Freq: Three times a day (TID) | ORAL | Status: DC | PRN
  Administered 2023-07-16 – 2023-07-29 (×15): 0.5 mg
  Filled 2023-07-16 (×16): qty 1

## 2023-07-16 MED ORDER — DEXTROSE 50 % IV SOLN
INTRAVENOUS | Status: AC
  Administered 2023-07-16: 50 mL via INTRAVENOUS
  Filled 2023-07-16: qty 50

## 2023-07-16 NOTE — Progress Notes (Signed)
 PHARMACY CONSULT NOTE FOR:  OUTPATIENT  PARENTERAL ANTIBIOTIC THERAPY (OPAT)  Indication: KPC klebsiella pneumoniae bacteremia Regimen: Ceftazidime -avibactam 1.25 gm every 8 hours  End date: 07/24/23  IV antibiotic discharge orders are pended. To discharging provider:  please sign these orders via discharge navigator,  Select New Orders & click on the button choice - Manage This Unsigned Work.     Thank you for allowing pharmacy to be a part of this patient's care.  Denson Flake, PharmD, BCPS, BCIDP Infectious Diseases Clinical Pharmacist Phone: [phone] 07/16/2023, 12:23 PM

## 2023-07-16 NOTE — TOC Progression Note (Signed)
 Transition of Care (TOC) - Progression Note    Patient Details  Name: Albert Vance. MRN: [id number] Date of Birth: 12-Jan-1946  Transition of Care [facility]) CM/SW Contact  Katrinka Parr, [place] Phone Number: 07/16/2023, 1:21 PM  Clinical Narrative:     CSW called Nikki with [facility] admissions # [phone] who states their clinical team has not yet reviewed updated clinical information. Landa Pine confirms documents were received and she will reach back out to her clinical team.   Expected Discharge Plan: Skilled Nursing Facility Barriers to Discharge: Continued Medical Work up, SNF Pending bed offer  Expected Discharge Plan and Services     Post Acute Care Choice: Skilled Nursing Facility Living arrangements for the past 2 months:  (admitted from [facility])                                       Social Determinants of Health (SDOH) Interventions SDOH Screenings   Food Insecurity: No Food Insecurity (07/14/2023)  Housing: Low Risk  (07/14/2023)  Transportation Needs: Patient Unable To Answer (07/14/2023)  Utilities: Not At Risk (07/14/2023)  Social Connections: Patient Unable To Answer (07/14/2023)  Tobacco Use: Medium Risk (07/14/2023)    Readmission Risk Interventions     No data to display

## 2023-07-16 NOTE — Progress Notes (Addendum)
 .                Subjective More alert today though still non-verbal. Was able to squeeze hand on command.  Physical exam Blood pressure (!) 141/60, pulse 80, temperature 98.5 F (36.9 C), temperature source Axillary, resp. rate (!) 27, height 5\' 6"  (1.676 m), weight 79.4 kg, SpO2 97%.  Constitutional: chronically ill-appearing, lying in bed on ventilator, in no acute distress Cardiovascular: regular rate and rhythm, no m/r/g, anasarca mildly improved in upper extremities Pulmonary/Chest: ventilator sounds to anterior auscultation, coarse Neurological: opens eyes/squeezes hand on command Skin: warm and dry  Weight change: 0.4 kg   Intake/Output Summary (Last 24 hours) at 07/16/2023 1043 Last data filed at 07/16/2023 0800 Gross per 24 hour  Intake 2218.17 ml  Output 2700 ml  Net -481.83 ml   Net IO Since Admission: -33.65 mL [07/16/23 1043]  Labs, images, and other studies    Latest Ref Rng & Units 07/16/2023    9:39 AM 07/15/2023    2:52 AM 07/14/2023    2:52 AM  CBC  WBC 4.0 - 10.5 K/uL 10.7  7.6  7.7   Hemoglobin 13.0 - 17.0 g/dL 7.7  7.2  7.2   Hematocrit 39.0 - 52.0 % 25.9  24.7  24.8   Platelets 150 - 400 K/uL 221  177  194        Latest Ref Rng & Units 07/16/2023    9:39 AM 07/15/2023   12:01 PM 07/15/2023    2:52 AM  BMP  Glucose 70 - 99 mg/dL 96  92  161   BUN 8 - 23 mg/dL 096  97  045   Creatinine 0.61 - 1.24 mg/dL 4.09  8.11  9.14   Sodium 135 - 145 mmol/L 146  148  148   Potassium 3.5 - 5.1 mmol/L 4.3  4.3  4.6   Chloride 98 - 111 mmol/L 110  114  113   CO2 22 - 32 mmol/L 26  27  25    Calcium 8.9 - 10.3 mg/dL 8.8  8.2  8.4      Assessment and plan Hospital day 6  Albert Vance. is a 78 y.o.male chronic respiratory failure with tracheostomy since 2014, T2DM, CAD, atrial fibrillation, and anemia of chronic illness who is admitted from [facility] hospital for sepsis due to CR Klebsiella UTI and bacteremia with AKI.    Klebsiella Bacteremia  Pulmonary Edema vs  Infiltrates Chronic Respiratory Failure w/ Trach and Vent Dependence  Remains afebrile. Mild leukocytosis at 10.7. Repeat BC NG 3 days. -Continue Avycaz  (Day 6 with planned EOT 5/15). He is medically stable pending SNF placement and will need a PICC line prior to discharge. 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 Mild increase in creatinine to 1.71. Will continue free water @ 250 cc q 2 through today but plan to discontinue tomorrow. Appreciate RD input. -Trend BMP  T2DM  Hypoglycemic at 54 this morning. PCCM reduced basal to 18 units.  Continuing novolog  8 units q 4 in addition to SSI.    Macrocytic anemia 2/2 chronic disease Stable at 7.7. Trend.   Hx of Atrial Fibrillation  Not a candidate for anticoagulation given history of SDH. Currently NSR.  -Continue cardiac monitoring   HTN Stable. Continue Norvasc    Goals of Care Disposition Per discussion with wife, Albert Vance, patient is now DNR but full scope of care remains. Appreciate TOC help with SNF placement  which is pending.  [name], DO 07/16/2023, 10:43 AM  Pager: [phone] After 5pm or weekend: [phone]

## 2023-07-16 NOTE — Progress Notes (Signed)
 Patient ID: Aisha Hove., male   DOB: Jan 28, 1946, 78 y.o.   MRN: [id number]    Progress Note from the Palliative Medicine Team at [facility]   Patient Name: Albert Vance.        Date: 07/16/2023 DOB: 05/10/1945  Age: 78 y.o. MRN#: [id number] Attending Physician: Bevelyn Bryant, MD Primary Care Physician: Jerral Moos, MD Admit Date: 07/10/2023   Reason for Consultation/Follow-up   Establishing Goals of Care   HPI/ Brief Hospital Review  78 y.o. 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 Found to be septic due to CRE klebsiella UTI and bacteremia with AKI.   Family face treatment option decisions, advanced directive decisions and anticipatory care needs.  Subjective  Extensive chart review has been completed prior to meeting with patient/family  including labs, vital signs, imaging, progress/consult notes, orders, medications and available advance directive documents.    This NP assessed patient at the bedside and spoke to patient's wife for ongoing conversation regarding goals of care and treatment plan, and to assess for palliative medicine needs and emotional support.     Patient appears generally uncomfortable.  Unable to follow commands.  Ventilator dependent.    Education offered on the seriousness of patient's prolonged hospitalizations and SNF stays over the past 6 months.  We discussed his high risk for decompensation in spite of full medical support.   Wife tearfully acknowledges this.   Today we explored concept of suffering.  Patient with severe grimacing on repositioning of lower extremities by his wife.    Education offered on the difference between a full medical support path hoping for improvement versus a full comfort path focusing on comfort and  dignity allowing a natural death.  I offered information on the process of liberating a person from the ventilator, allowing for natural death.  Natural trajectory and expectations at end-of-life discussed.  Wife verbalizes that she will never be able to make a shift "like that".  She verbalizes hope that God will "just take him with all of this".  Understandably this is extremely difficult for patient's wife    Wife  remains open to all offered and available medical interventions to prolong life.  Education offered today regarding  the importance of continued conversation with family and their  medical providers regarding overall plan of care and treatment options,  ensuring decisions are within the context of the patients values and GOCs.  Questions and concerns addressed   Discussed with primary team and nursing staff   Time: 50 minutes  Detailed review of medical records ( labs, imaging, vital signs), medically appropriate exam ( MS, skin, cardiac,  resp)   discussed with treatment team, counseling and education to patient, family, staff, documenting clinical information, medication management, coordination of care    Thena Fireman NP  Palliative Medicine Team Team Phone # [phone] Pager [phone]

## 2023-07-16 NOTE — Progress Notes (Addendum)
 NAME:  Albert Haagen., MRN:  [id number], DOB:  1946-02-02, LOS: 6 ADMISSION DATE:  07/10/2023, CONSULTATION DATE:  07/10/2023 REFERRING MD:  Scarlette Currier - EDP, CHIEF COMPLAINT: Abnormal labs   History of Present Illness:  A 78 yr old male patient with hx of SDH (s/p mini crani/evac 12/2022 at [facility] c/b R cerebellar hemorrhage and seizures), multiple aspiration PNA, failed extubation, chronic resp failure requiring trach/PEG, PTSD, DM-2, HTN, CAD, GERD, ? Dementia, Afib (off NOAC), PCM, and anemia of chronic illness. He has been a resident of [facility] since 01/2023 when he was dc from [facility] for SDH/sequelae of. He presented to [facility] ED 5/1 from [facility] for further evaluation of abnormal labs -- noted to have elevated Cr and low Hgb. Foley exchanged. Given one unit of PRBC and 1 L NS 0.9% bolus.   On stable vent settings (PRVC 20/510/+5/40%) and without trach complications.  PCCM consulted for evaluation in this setting.  Pertinent Medical History:  SDH, seizures (weaned off Keppra), multiple aspiration PNA, failed extubation, chronic resp failure requiring trach/PEG, PTSD, DM-2, HTN, CAD, GERD, ? Dementia, Afib (off NOAC), PCM, and anemia of chronic illness  Significant Hospital Events: Including procedures, antibiotic start and stop dates in addition to other pertinent events   5/1 1U PRBC for hgb 6.6. PCCM consult Foley exchanged in the ED here. 5/2 Cultures positive for klebsiella with CRE.  Interim History / Subjective:  No significant events overnight More awake, appears more uncomfortable than prior Tachypneic to 30s with moderate-thick secretions, frequent coughing Repeat CXR Low threshold for lavage versus bronch if CXR with increased opacities/c/f plugging  Objective:  Blood pressure (!) 145/99, pulse 78, temperature 98.5 F (36.9 C), temperature source Axillary, resp. rate (!) 31, height 5\' 6"  (1.676 m), weight 79.4 kg, SpO2 100%.    Vent Mode: PRVC FiO2 (%):  [40 %] 40 % Set Rate:   [20 bmp] 20 bmp Vt Set:  [510 mL] 510 mL PEEP:  [5 cmH20] 5 cmH20 Pressure Support:  [12 cmH20] 12 cmH20 Plateau Pressure:  [22 cmH20-23 cmH20] 23 cmH20   Intake/Output Summary (Last 24 hours) at 07/16/2023 0945 Last data filed at 07/16/2023 0800 Gross per 24 hour  Intake 2268.17 ml  Output 2700 ml  Net -431.83 ml   Filed Weights   07/14/23 0500 07/15/23 0500 07/16/23 0500  Weight: 78.7 kg 79 kg 79.4 kg   Physical Examination: General: Acute-on-chronically ill-appearing elderly man in NAD. Appears uncomfortable, coughing. HEENT: [place]/AT, anicteric sclera, PERRL, moist mucous membranes. Neck: Tracheostomy midline without surrounding erythema/drainage. Neuro:  Awake, unable to assess orientation.  Responds/tracks to verbal stimuli. Following commands intermittently. Moves BUE spontaneously. Weakness of BLE, generalized weakness. +Cough and +Gag  CV: RRR, no m/g/r. PULM: Breathing tachypneic to high 20s-low 30s and mildly labored on vent (PEEP 5, FiO2 40%). Lung fields coarse throughout. Trach in place with moderate-thick tan-yellow secretions. GI: Soft, nontender, nondistended. Normoactive bowel sounds. GU: Generalized penile/scrotal edema, Foley in place. Extremities: +Anasarca, increased edema BUE, weeping/small skin tears noted. Skin: Warm/dry, +weeping, generalized edema, scattered ecchymosis of BUE/lower abdomen.  Resolved Hospital Problem List:    Assessment & Plan:  Chronic respiratory failure Trach/vent dependence  Severe sepsis secondary to CRE Klebsiella UTI with bacteremia AKI/?CKD:  H/o SDH (s/p mini crani/evac 12/2022 at [facility] c/b R cerebellar hemorrhage and seizures)  Acute  metabolic encephalopathy DM-2 w/ hyperglycemia: A little worse on 5/4 HTN, CAD, s/p CABG, S-CHD (EF at [facility] 44%)   Afib (off NOAC) GERD Severe  PCM Acute anemia on chronic anemia of chronic illness: Intermittent fluid and electrolyte imbalance: hypernatremia, hyperchloremia:   Pulmonary Problem  List: Chronic respiratory failure w/ Trach/vent dependence, complicated by VAP, suspect CRE Klebsiella Prolonged course all stemming from SDH fall of 2024. Remains trach/vent dependent. His wife is hopeful he can recover, but understands at this point it is very unlikely. Weaning on vent PSV 12/5 today with RR 20s, tolerating relatively well while sleeping. Wife and I discussed how deconditioning and generalized weakness plays a role in making prolonged weaning/staying off of the ventilator to tolerate ATC very difficult. She asks for continued working with him to try and build his tolerance. - Continue full vent support (4-8cc/kg IBW), weaning as able (tolerated PSV 12/5 on 5/6, more coughing/discomfort today, higher RR, increased secretions - Wean FiO2 for O2 sat > 90% - Daily WUA/SBT as tolerated - VAP bundle - Trach care per protocol (cuffed Shiley #6/#4 at bedside in the event emergent replacement of Portex is indicated, backup Portex ordered) - Lasix  x 1 5/7 for volume overload, CXR wet - HTS 3% nebs BID - Monitor I&Os - Pulmonary hygiene - Continue Avycaz  per ID - Appreciate ongoing PMT involvement/GOC discussion  Other issues per Primary Team (IMTS). Wife expresses wishes for [facility] versus another facility, does not want to return to [facility].  Best Practice (right click and "Reselect all SmartList Selections" daily)   Per Primary Team  Signature:   Genoveva Kidney  Pulmonary & Critical Care 07/16/23 9:45 AM  Please see [email or web address] for pager details.  From 7A-7P if no response, please call [phone] After hours, please call ELink [phone]

## 2023-07-16 NOTE — Inpatient Diabetes Management (Signed)
 Inpatient Diabetes Program Recommendations  AACE/ADA: New Consensus Statement on Inpatient Glycemic Control (2015)  Target Ranges:  Prepandial:   less than 140 mg/dL      Peak postprandial:   less than 180 mg/dL (1-2 hours)      Critically ill patients:  140 - 180 mg/dL   Lab Results  Component Value Date   GLUCAP 106 (H) 07/16/2023   HGBA1C 7.9 (H) 07/11/2023    Review of Glycemic Control  Latest Reference Range & Units 07/15/23 19:24 07/15/23 23:20 07/16/23 03:20 07/16/23 07:36 07/16/23 08:23 07/16/23 11:14  Glucose-Capillary 70 - 99 mg/dL 643 (H) 329 (H) 518 (H) 54 (L) 103 (H) 106 (H)   Diabetes history: DM 2 Outpatient Diabetes medications:  Lantus 20 units daily Humalog 0-12 units tid with meals and HS Current orders for Inpatient glycemic control:  Novolog  0-15 units q 4 hours Osmolite 45 ml/hr Semglee 18 units daily Novolog  8 units q 4 hours  Inpatient Diabetes Program Recommendations:    Consider reducing Novolog  tube feed coverage to 6 units q 4 hours.   Thanks,   Thanks,  Josefa Ni, RN, BC-ADM Inpatient Diabetes Coordinator Pager [phone]  (8a-5p)

## 2023-07-16 NOTE — Progress Notes (Signed)
 [facility]  Date of Admission:  07/10/2023     Reason for Follow Up: Hypernatremia  Total days of antibiotics 7         ASSESSMENT:  Mr. Weers blood cultures from 07/13/2023 remain without growth in the setting of carbapenem resistant Klebsiella pneumonia and subsequent finding of Pseudomonas on respiratory cultures.  Will continue to monitor cultures until finalized.  Plan for 2 weeks of ceftazidime -avibactam from line removal with end of treatment 07/25/2023.  Continue contact precautions for carbapenem resistant organism.  Reminder to throw away all tube feeds in the trash and nothing into the sink that may colonize sink trap.  Remaining medical and supportive care per PCCM and internal medicine.  PLAN:  Continue current dose of ceftazidime -avibactam until 07/25/2023. Monitor cultures for clearance of bacteremia until finalized. Contact precautions for carbapenem resistant organism.  Please throw away all tube feed in the trash and nothing into the sink. Remaining medical and supportive care per internal medicine and PCCM.  ID will sign off.  Please reconsult if needed.  Principal Problem:   Hypernatremia Active Problems:   Carbapenem-resistant bacterial infection   AKI (acute kidney injury) (HCC)   Ventilator dependent (HCC)   Symptomatic anemia   Anasarca   Pressure injury of skin of left heel   Hypoalbuminemia   Klebsiella sepsis (HCC)   Goals of care, counseling/discussion    amantadine   100 mg Per Tube Daily   amLODipine   10 mg Per Tube Daily   brimonidine   1 drop Both Eyes Q8H   Chlorhexidine  Gluconate Cloth  6 each Topical Daily   enoxaparin (LOVENOX) injection  40 mg Subcutaneous Daily   feeding supplement (PROSource TF20)  60 mL Per Tube BID   free water  250 mL Per Tube Q2H   furosemide   40 mg Intravenous Once   insulin  aspart  0-15 Units Subcutaneous Q4H   insulin  aspart  8 Units Subcutaneous Q4H   insulin  glargine-yfgn  18 Units  Subcutaneous Daily   ipratropium-albuterol   3 mL Nebulization TID   modafinil   100 mg Per Tube Daily   nutrition supplement (JUVEN)  1 packet Per Tube BID BM   mouth rinse  15 mL Mouth Rinse Q2H   pantoprazole  (PROTONIX ) IV  40 mg Intravenous Daily   polyvinyl alcohol   1 drop Both Eyes Q4H   sodium chloride  HYPERTONIC  4 mL Nebulization BID    SUBJECTIVE:  Afebrile overnight with no acute events. Eyes are open but does not track voice or follow commands.   Allergies  Allergen Reactions   Empagliflozin Other (See Comments)    Urinary tract infections   Codeine Other (See Comments)    Unknown    Gabapentin Other (See Comments)    Allergy not listed on MAR   Lisinopril Other (See Comments)    Unknown    Metformin And Related Other (See Comments)    Unknown      Review of Systems: Review of Systems  Unable to perform ROS: Medical condition      OBJECTIVE: Vitals:   07/16/23 1100 07/16/23 1111 07/16/23 1137 07/16/23 1138  BP: 137/78     Pulse: 81   83  Resp: (!) 23   (!) 32  Temp:  98.3 F (36.8 C)    TempSrc:  Axillary    SpO2: 98%  98% 97%  Weight:      Height:       Body mass index is 28.25 kg/m.  Physical Exam Constitutional:      General: He is not in acute distress.    Appearance: He is well-developed.     Comments: Lying in bed with head of bed elevated; eyes open but does not track voice or follow commands.  Cardiovascular:     Rate and Rhythm: Normal rate and regular rhythm.     Heart sounds: Normal heart sounds.  Pulmonary:     Effort: Pulmonary effort is normal.     Breath sounds: Normal breath sounds.  Skin:    General: Skin is warm and dry.     Lab Results Lab Results  Component Value Date   WBC 10.7 (H) 07/16/2023   HGB 7.7 (L) 07/16/2023   HCT 25.9 (L) 07/16/2023   MCV 102.4 (H) 07/16/2023   PLT 221 07/16/2023    Lab Results  Component Value Date   CREATININE 1.71 (H) 07/16/2023   BUN 103 (H) 07/16/2023   NA 146 (H) 07/16/2023    K 4.3 07/16/2023   CL 110 07/16/2023   CO2 26 07/16/2023    Lab Results  Component Value Date   ALT 23 07/10/2023   AST 18 07/10/2023   ALKPHOS 231 (H) 07/10/2023   BILITOT 0.9 07/10/2023     Microbiology: Recent Results (from the past 240 hours)  MRSA Next Gen by PCR, Nasal     Status: Abnormal   Collection Time: 07/10/23  5:39 PM   Specimen: Nasal Mucosa; Nasal Swab  Result Value Ref Range Status   MRSA by PCR Next Gen DETECTED (A) NOT DETECTED Final    Comment: RESULT CALLED TO, READ BACK BY AND VERIFIED WITH: [id number] AT 1938 ADC TO RN Lus Salter W. (NOTE) The GeneXpert MRSA Assay (FDA approved for NASAL specimens only), is one component of a comprehensive MRSA colonization surveillance program. It is not intended to diagnose MRSA infection nor to guide or monitor treatment for MRSA infections. Test performance is not FDA approved in patients less than 11 years old. Performed at [facility], [address], [place], [place] [address]   Culture, blood (Routine X 2) w Reflex to ID Panel     Status: Abnormal   Collection Time: 07/10/23  6:32 PM   Specimen: BLOOD  Result Value Ref Range Status   Specimen Description BLOOD SITE NOT SPECIFIED  Final   Special Requests   Final    BOTTLES DRAWN AEROBIC AND ANAEROBIC Blood Culture adequate volume   Culture  Setup Time   Final    GRAM NEGATIVE RODS IN BOTH AEROBIC AND ANAEROBIC BOTTLES CRITICAL RESULT CALLED TO, READ BACK BY AND VERIFIED WITH: PHARMD V BRYK 07/11/2023 @ 0700 BY AB    Culture (A)  Final    KLEBSIELLA PNEUMONIAE CONFIRMED CARBAPENEMASE RESISTANT ENTEROBACTERIACAE CRITICAL RESULT CALLED TO, READ BACK BY AND VERIFIED WITH: RN Maeola Schmidt [id number] BY Chestine Costain, [place] [facility] NOTIFIED Performed at [facility], [address] [place], [place] [address]    Report Status 07/14/2023 FINAL  Final   Organism ID, Bacteria KLEBSIELLA PNEUMONIAE  Final      Susceptibility   Klebsiella pneumoniae -  MIC*    AMPICILLIN >=32 RESISTANT Resistant     CEFEPIME >=32 RESISTANT Resistant     CEFTAZIDIME  >=64 RESISTANT Resistant     CEFTRIAXONE  >=64 RESISTANT Resistant     CIPROFLOXACIN >=4 RESISTANT Resistant     GENTAMICIN 8 INTERMEDIATE Intermediate     IMIPENEM 8 INTERMEDIATE Intermediate  TRIMETH/SULFA >=320 RESISTANT Resistant     AMPICILLIN/SULBACTAM >=32 RESISTANT Resistant     PIP/TAZO >=128 RESISTANT Resistant ug/mL    * KLEBSIELLA PNEUMONIAE  Culture, blood (Routine X 2) w Reflex to ID Panel     Status: Abnormal   Collection Time: 07/10/23  6:32 PM   Specimen: BLOOD  Result Value Ref Range Status   Specimen Description BLOOD SITE NOT SPECIFIED  Final   Special Requests   Final    BOTTLES DRAWN AEROBIC AND ANAEROBIC Blood Culture adequate volume   Culture  Setup Time   Final    GRAM NEGATIVE RODS IN BOTH AEROBIC AND ANAEROBIC BOTTLES CRITICAL VALUE NOTED.  VALUE IS CONSISTENT WITH PREVIOUSLY REPORTED AND CALLED VALUE.    Culture (A)  Final    KLEBSIELLA PNEUMONIAE SUSCEPTIBILITIES PERFORMED ON PREVIOUS CULTURE WITHIN THE LAST 5 DAYS. Performed at [facility], [address], [place], [place] [address]    Report Status 07/14/2023 FINAL  Final  Blood Culture ID Panel (Reflexed)     Status: Abnormal   Collection Time: 07/10/23  6:32 PM  Result Value Ref Range Status   Enterococcus faecalis NOT DETECTED NOT DETECTED Final   Enterococcus Faecium NOT DETECTED NOT DETECTED Final   Listeria monocytogenes NOT DETECTED NOT DETECTED Final   Staphylococcus species NOT DETECTED NOT DETECTED Final   Staphylococcus aureus (BCID) NOT DETECTED NOT DETECTED Final   Staphylococcus epidermidis NOT DETECTED NOT DETECTED Final   Staphylococcus lugdunensis NOT DETECTED NOT DETECTED Final   Streptococcus species NOT DETECTED NOT DETECTED Final   Streptococcus agalactiae NOT DETECTED NOT DETECTED Final   Streptococcus pneumoniae NOT DETECTED NOT DETECTED Final   Streptococcus  pyogenes NOT DETECTED NOT DETECTED Final   A.calcoaceticus-baumannii NOT DETECTED NOT DETECTED Final   Bacteroides fragilis NOT DETECTED NOT DETECTED Final   Enterobacterales DETECTED (A) NOT DETECTED Final    Comment: Enterobacterales represent a large order of gram negative bacteria, not a single organism. CRITICAL RESULT CALLED TO, READ BACK BY AND VERIFIED WITH: PHARMD V BRYK 07/11/2023 @ 0700 BY AB    Enterobacter cloacae complex NOT DETECTED NOT DETECTED Final   Escherichia coli NOT DETECTED NOT DETECTED Final   Klebsiella aerogenes NOT DETECTED NOT DETECTED Final   Klebsiella oxytoca NOT DETECTED NOT DETECTED Final   Klebsiella pneumoniae DETECTED (A) NOT DETECTED Final    Comment: CRITICAL RESULT CALLED TO, READ BACK BY AND VERIFIED WITH: PHARMD V BRYK 07/11/2023 @ 0700 BY AB    Proteus species NOT DETECTED NOT DETECTED Final   Salmonella species NOT DETECTED NOT DETECTED Final   Serratia marcescens NOT DETECTED NOT DETECTED Final   Haemophilus influenzae NOT DETECTED NOT DETECTED Final   Neisseria meningitidis NOT DETECTED NOT DETECTED Final   Pseudomonas aeruginosa NOT DETECTED NOT DETECTED Final   Stenotrophomonas maltophilia NOT DETECTED NOT DETECTED Final   Candida albicans NOT DETECTED NOT DETECTED Final   Candida auris NOT DETECTED NOT DETECTED Final   Candida glabrata NOT DETECTED NOT DETECTED Final   Candida krusei NOT DETECTED NOT DETECTED Final   Candida parapsilosis NOT DETECTED NOT DETECTED Final   Candida tropicalis NOT DETECTED NOT DETECTED Final   Cryptococcus neoformans/gattii NOT DETECTED NOT DETECTED Final   CTX-M ESBL NOT DETECTED NOT DETECTED Final   Carbapenem resistance IMP NOT DETECTED NOT DETECTED Final   Carbapenem resistance KPC DETECTED (A) NOT DETECTED Final    Comment: CRITICAL RESULT CALLED TO, READ BACK BY AND VERIFIED WITH: PHARMD V BRYK 07/11/2023 @  0700 BY AB (NOTE) Carbapenemase resistant enterobacterales detected.  Recommend ceftazidime Darnell Elbe as intitial therapy.     Carbapenem resistance NDM NOT DETECTED NOT DETECTED Final   Carbapenem resist OXA 48 LIKE NOT DETECTED NOT DETECTED Final   Carbapenem resistance VIM NOT DETECTED NOT DETECTED Final    Comment: Performed at [facility], [address] [place], [place] [address]  Carbapenem Resistance Panel     Status: Abnormal   Collection Time: 07/10/23  6:32 PM  Result Value Ref Range Status   Carba Resistance IMP Gene NOT DETECTED NOT DETECTED Final   Carba Resistance VIM Gene NOT DETECTED NOT DETECTED Final   Carba Resistance NDM Gene NOT DETECTED NOT DETECTED Final   Carba Resistance KPC Gene DETECTED (A) NOT DETECTED Final    Comment: CRITICAL RESULT CALLED TO, READ BACK BY AND VERIFIED WITH: RN E REYNA [id number] BY J RAZZAK, [place]    Carba Resistance OXA48 Gene NOT DETECTED NOT DETECTED Final    Comment: (NOTE) Cepheid Carba-R is an FDA-cleared nucleic acid amplification test  (NAAT)for the detection and differentiation of genes encoding the  most prevalent carbapenemases in bacterial isolate samples. Carbapenemase gene identification and implementation of comprehensive  infection control measures are recommended by the CDC to prevent the  spread of the resistant organisms. Performed at [facility], [address] [place], [place] [address]   Culture, Respiratory w Gram Stain     Status: None   Collection Time: 07/12/23  9:37 AM   Specimen: Tracheal Aspirate; Respiratory  Result Value Ref Range Status   Specimen Description TRACHEAL ASPIRATE  Final   Special Requests NONE  Final   Gram Stain   Final    NO SQUAMOUS EPITHELIAL CELLS SEEN WBC PRESENT, PREDOMINANTLY PMN ABUNDANT GRAM NEGATIVE RODS    Culture   Final    MODERATE PSEUDOMONAS AERUGINOSA MODERATE KLEBSIELLA PNEUMONIAE    Report Status 07/15/2023 FINAL  Final   Organism ID, Bacteria PSEUDOMONAS AERUGINOSA  Final   Organism ID, Bacteria KLEBSIELLA  PNEUMONIAE  Final      Susceptibility   Klebsiella pneumoniae - MIC*    AMPICILLIN >=32 RESISTANT Resistant     CEFEPIME <=0.12 SENSITIVE Sensitive     CEFTAZIDIME  <=1 SENSITIVE Sensitive     CEFTRIAXONE  <=0.25 SENSITIVE Sensitive     CIPROFLOXACIN <=0.25 SENSITIVE Sensitive     GENTAMICIN <=1 SENSITIVE Sensitive     IMIPENEM <=0.25 SENSITIVE Sensitive     TRIMETH/SULFA >=320 RESISTANT Resistant     AMPICILLIN/SULBACTAM 8 SENSITIVE Sensitive     PIP/TAZO <=4 SENSITIVE Sensitive ug/mL    * MODERATE KLEBSIELLA PNEUMONIAE   Pseudomonas aeruginosa - MIC*    CEFTAZIDIME  4 SENSITIVE Sensitive     CIPROFLOXACIN <=0.25 SENSITIVE Sensitive     GENTAMICIN <=1 SENSITIVE Sensitive     IMIPENEM 2 SENSITIVE Sensitive     CEFEPIME 2 SENSITIVE Sensitive     * MODERATE PSEUDOMONAS AERUGINOSA  Culture, blood (Routine X 2) w Reflex to ID Panel     Status: None (Preliminary result)   Collection Time: 07/13/23 11:48 AM   Specimen: BLOOD LEFT HAND  Result Value Ref Range Status   Specimen Description BLOOD LEFT HAND  Final   Special Requests   Final    BOTTLES DRAWN AEROBIC AND ANAEROBIC Blood Culture adequate volume   Culture   Final    NO GROWTH 3 DAYS Performed at [facility], [address], [place], [place] [address]  Report Status PENDING  Incomplete  Culture, blood (Routine X 2) w Reflex to ID Panel     Status: None (Preliminary result)   Collection Time: 07/13/23 12:04 PM   Specimen: BLOOD RIGHT HAND  Result Value Ref Range Status   Specimen Description BLOOD RIGHT HAND  Final   Special Requests   Final    BOTTLES DRAWN AEROBIC AND ANAEROBIC Blood Culture adequate volume   Culture   Final    NO GROWTH 3 DAYS Performed at [facility], [address], [place], [place] [address]    Report Status PENDING  Incomplete     Marlan Silva, NP [facility]  07/16/2023  11:56 AM

## 2023-07-17 ENCOUNTER — Other Ambulatory Visit: Payer: Self-pay

## 2023-07-17 ENCOUNTER — Inpatient Hospital Stay (HOSPITAL_COMMUNITY)

## 2023-07-17 DIAGNOSIS — N179 Acute kidney failure, unspecified: Secondary | ICD-10-CM | POA: Diagnosis not present

## 2023-07-17 DIAGNOSIS — N390 Urinary tract infection, site not specified: Secondary | ICD-10-CM

## 2023-07-17 DIAGNOSIS — A419 Sepsis, unspecified organism: Secondary | ICD-10-CM | POA: Diagnosis not present

## 2023-07-17 DIAGNOSIS — E8809 Other disorders of plasma-protein metabolism, not elsewhere classified: Secondary | ICD-10-CM | POA: Diagnosis not present

## 2023-07-17 DIAGNOSIS — E870 Hyperosmolality and hypernatremia: Secondary | ICD-10-CM | POA: Diagnosis not present

## 2023-07-17 DIAGNOSIS — R6520 Severe sepsis without septic shock: Secondary | ICD-10-CM | POA: Diagnosis not present

## 2023-07-17 DIAGNOSIS — E1165 Type 2 diabetes mellitus with hyperglycemia: Secondary | ICD-10-CM

## 2023-07-17 DIAGNOSIS — R601 Generalized edema: Secondary | ICD-10-CM | POA: Diagnosis not present

## 2023-07-17 DIAGNOSIS — J9610 Chronic respiratory failure, unspecified whether with hypoxia or hypercapnia: Secondary | ICD-10-CM | POA: Diagnosis not present

## 2023-07-17 LAB — URINALYSIS, ROUTINE W REFLEX MICROSCOPIC
Bilirubin Urine: NEGATIVE
Glucose, UA: NEGATIVE mg/dL
Ketones, ur: NEGATIVE mg/dL
Leukocytes,Ua: NEGATIVE
Nitrite: NEGATIVE
Protein, ur: 100 mg/dL — AB
RBC / HPF: >50 RBC/hpf (ref 0–5)
Specific Gravity, Urine: 1.012 (ref 1.005–1.030)
WBC, UA: >50 WBC/hpf (ref 0–5)
pH: 7 (ref 5.0–8.0)

## 2023-07-17 LAB — BASIC METABOLIC PANEL WITH GFR
Anion gap: 8 (ref 5–15)
BUN: 103 mg/dL — ABNORMAL HIGH (ref 8–23)
CO2: 25 mmol/L (ref 22–32)
Calcium: 8.4 mg/dL — ABNORMAL LOW (ref 8.9–10.3)
Chloride: 108 mmol/L (ref 98–111)
Creatinine, Ser: 1.5 mg/dL — ABNORMAL HIGH (ref 0.61–1.24)
GFR, Estimated: 47 mL/min — ABNORMAL LOW (ref >60)
Glucose, Bld: 156 mg/dL — ABNORMAL HIGH (ref 70–99)
Potassium: 3.9 mmol/L (ref 3.5–5.1)
Sodium: 141 mmol/L (ref 135–145)

## 2023-07-17 LAB — GLUCOSE, CAPILLARY
Glucose-Capillary: 147 mg/dL — ABNORMAL HIGH (ref 70–99)
Glucose-Capillary: 133 mg/dL — ABNORMAL HIGH (ref 70–99)
Glucose-Capillary: 93 mg/dL (ref 70–99)
Glucose-Capillary: 59 mg/dL — ABNORMAL LOW (ref 70–99)
Glucose-Capillary: 95 mg/dL (ref 70–99)
Glucose-Capillary: 58 mg/dL — ABNORMAL LOW (ref 70–99)
Glucose-Capillary: 118 mg/dL — ABNORMAL HIGH (ref 70–99)
Glucose-Capillary: 72 mg/dL (ref 70–99)

## 2023-07-17 LAB — CBC
HCT: 25.7 % — ABNORMAL LOW (ref 39.0–52.0)
Hemoglobin: 7.7 g/dL — ABNORMAL LOW (ref 13.0–17.0)
MCH: 30.4 pg (ref 26.0–34.0)
MCHC: 30 g/dL (ref 30.0–36.0)
MCV: 101.6 fL — ABNORMAL HIGH (ref 80.0–100.0)
Platelets: 213 10*3/uL (ref 150–400)
RBC: 2.53 MIL/uL — ABNORMAL LOW (ref 4.22–5.81)
RDW: 20.2 % — ABNORMAL HIGH (ref 11.5–15.5)
WBC: 11.3 10*3/uL — ABNORMAL HIGH (ref 4.0–10.5)
nRBC: 0 % (ref 0.0–0.2)

## 2023-07-17 LAB — MAGNESIUM: Magnesium: 2.6 mg/dL — ABNORMAL HIGH (ref 1.7–2.4)

## 2023-07-17 LAB — PHOSPHORUS: Phosphorus: 2.8 mg/dL (ref 2.5–4.6)

## 2023-07-17 MED ORDER — LIDOCAINE HCL (PF) 2% IJ FOR NEBU
2.5000 mL | Freq: Once | RESPIRATORY_TRACT | Status: AC
Start: 2023-07-17 — End: 2023-07-17
  Administered 2023-07-17: 2.5 mL via RESPIRATORY_TRACT
  Filled 2023-07-17: qty 5

## 2023-07-17 MED ORDER — FUROSEMIDE 10 MG/ML IJ SOLN
40.0000 mg | Freq: Once | INTRAMUSCULAR | Status: AC
  Administered 2023-07-17: 40 mg via INTRAVENOUS
  Filled 2023-07-17: qty 4

## 2023-07-17 MED ORDER — FREE WATER
250.0000 mL | Status: DC
  Administered 2023-07-17 – 2023-07-18 (×3): 250 mL
  Administered 2023-07-18: 60 mL
  Administered 2023-07-18 – 2023-07-21 (×16): 250 mL

## 2023-07-17 MED ORDER — INSULIN ASPART 100 UNIT/ML IJ SOLN: 6.0000 [IU] | Freq: Four times a day (QID) | INTRAMUSCULAR | Status: DC

## 2023-07-17 MED ORDER — ONDANSETRON HCL 4 MG/2ML IJ SOLN
4.0000 mg | Freq: Four times a day (QID) | INTRAMUSCULAR | Status: DC | PRN
  Administered 2023-07-17: 4 mg via INTRAVENOUS
  Filled 2023-07-17: qty 2

## 2023-07-17 MED ORDER — ACETAMINOPHEN 325 MG PO TABS
650.0000 mg | ORAL_TABLET | Freq: Four times a day (QID) | ORAL | Status: DC | PRN
  Administered 2023-07-17 – 2023-07-20 (×3): 650 mg
  Filled 2023-07-17 (×3): qty 2

## 2023-07-17 MED ORDER — DEXTROSE 50 % IV SOLN
INTRAVENOUS | Status: AC
  Administered 2023-07-17: 50 mL
  Filled 2023-07-17: qty 50

## 2023-07-17 MED ORDER — INSULIN ASPART 100 UNIT/ML IJ SOLN: 0.0000 [IU] | Freq: Four times a day (QID) | INTRAMUSCULAR | Status: DC

## 2023-07-17 MED ORDER — DEXTROSE 50 % IV SOLN
1.0000 | Freq: Once | INTRAVENOUS | Status: AC
  Administered 2023-07-17: 50 mL via INTRAVENOUS
  Filled 2023-07-17: qty 50

## 2023-07-17 MED ORDER — IPRATROPIUM-ALBUTEROL 0.5-2.5 (3) MG/3ML IN SOLN
3.0000 mL | RESPIRATORY_TRACT | Status: DC
  Administered 2023-07-17 – 2023-07-18 (×9): 3 mL via RESPIRATORY_TRACT
  Filled 2023-07-17 (×6): qty 3
  Filled 2023-07-17: qty 6
  Filled 2023-07-17: qty 3

## 2023-07-17 NOTE — Progress Notes (Signed)
 Spoke with Wynona Hedger, RN for this patient regarding PICC placement. Pt to discharge to SNF x 7 days of Avycaz . Waiting for order clarification on midline vs PICC.

## 2023-07-17 NOTE — Progress Notes (Signed)
 Pt tachypneic, coughing. Inline suction via tracheostomy done, very thick, tan secretions on multiple suciton passes. RT called, patient lavaged and bagged, given hypertonic nebs. Pt continues to cough but with less agitation and tachypnia.

## 2023-07-17 NOTE — TOC Progression Note (Signed)
 Transition of Care (TOC) - Progression Note    Patient Details  Name: Albert Vance. MRN: [id number] Date of Birth: [birth year]  Transition of Care [facility]) CM/SW Contact  Katrinka Parr, [place] Phone Number: 07/17/2023, 4:11 PM  Clinical Narrative:     CSW received voicemail from [facility] explaining that they have concerns about pts hemoglobin; that he is bleeding from somewhere and can't accept him at this time. Arnetta Lank they would re-evaluate if he's more stable in a few days. TOC will continue to follow  Expected Discharge Plan: Skilled Nursing Facility Barriers to Discharge: Continued Medical Work up, SNF Pending bed offer  Expected Discharge Plan and Services     Post Acute Care Choice: Skilled Nursing Facility Living arrangements for the past 2 months:  (admitted from [facility])                                       Social Determinants of Health (SDOH) Interventions SDOH Screenings   Food Insecurity: No Food Insecurity (07/14/2023)  Housing: Low Risk  (07/14/2023)  Transportation Needs: Patient Unable To Answer (07/14/2023)  Utilities: Not At Risk (07/14/2023)  Social Connections: Patient Unable To Answer (07/14/2023)  Tobacco Use: Medium Risk (07/14/2023)    Readmission Risk Interventions     No data to display

## 2023-07-17 NOTE — Progress Notes (Signed)
 Pt with minimal output over the shift so far.  Foley flushed with sterile water 23mLx2 with bloody output and urine.  Will monitor.  Hgb stable this AM, will monitor

## 2023-07-17 NOTE — Progress Notes (Signed)
 NAME:  Albert Puskarich., MRN:  [id number], DOB:  1946-02-13, LOS: 7 ADMISSION DATE:  07/10/2023, CONSULTATION DATE:  07/10/2023 REFERRING MD:  Scarlette Currier - EDP, CHIEF COMPLAINT: Abnormal labs   History of Present Illness:  A 78 yr old male patient with hx of SDH (s/p mini crani/evac 12/2022 at [facility] c/b R cerebellar hemorrhage and seizures), multiple aspiration PNA, failed extubation, chronic resp failure requiring trach/PEG, PTSD, DM-2, HTN, CAD, GERD, ? Dementia, Afib (off NOAC), PCM, and anemia of chronic illness. He has been a resident of [facility] since 01/2023 when he was dc from [facility] for SDH/sequelae of. He presented to [facility] ED 5/1 from [facility] for further evaluation of abnormal labs -- noted to have elevated Cr and low Hgb. Foley exchanged. Given one unit of PRBC and 1 L NS 0.9% bolus.   On stable vent settings (PRVC 20/510/+5/40%) and without trach complications.  PCCM consulted for evaluation in this setting.  Pertinent Medical History:  SDH, seizures (weaned off Keppra), multiple aspiration PNA, failed extubation, chronic resp failure requiring trach/PEG, PTSD, DM-2, HTN, CAD, GERD, ? Dementia, Afib (off NOAC), PCM, and anemia of chronic illness  Significant Hospital Events: Including procedures, antibiotic start and stop dates in addition to other pertinent events   5/1 1U PRBC for hgb 6.6. PCCM consult Foley exchanged in the ED here. 5/2 Cultures positive for klebsiella with CRE.  Interim History / Subjective:  Remains on PRVC Hemodynamically stable  Wife met with palliative, DNR but continue current interventions   Objective:  Blood pressure (!) 152/110, pulse 96, temperature 99.3 F (37.4 C), temperature source Axillary, resp. rate (!) 32, height 5\' 6"  (1.676 m), weight 79 kg, SpO2 100%.    Vent Mode: PRVC FiO2 (%):  [40 %] 40 % Set Rate:  [20 bmp] 20 bmp Vt Set:  [510 mL] 510 mL PEEP:  [5 cmH20] 5 cmH20 Plateau Pressure:  [21 cmH20-27 cmH20] 26 cmH20   Intake/Output Summary  (Last 24 hours) at 07/17/2023 0828 Last data filed at 07/17/2023 4098 Gross per 24 hour  Intake 2589.92 ml  Output 1000 ml  Net 1589.92 ml   Filed Weights   07/15/23 0500 07/16/23 0500 07/17/23 0500  Weight: 79 kg 79.4 kg 79 kg   Physical Examination: General: ill-appearing elderly M resting in bed with trach on vent, NAD  HEENT: [place]/AT, anicteric sclera, PERRL, moist mucous membranes. Neck: Tracheostomy midline without surrounding erythema/drainage. Neuro:sleeping off sedation, wakes with stimulation but not interactive. +Cough and +Gag  CV: RRR, no m/g/r. PULM:course breath sounds throughout, on PRVC . Trach in place with moderate-thick tan-yellow secretions. GI: Soft, nontender, nondistended. Normoactive bowel sounds. GU: Generalized penile/scrotal edema, Foley in place. Extremities: +Anasarca, increased edema BUE, weeping/small skin tears noted. Skin: Warm/dry, +weeping, generalized edema, scattered ecchymosis of BUE/lower abdomen.  Resolved Hospital Problem List:    Assessment & Plan:  Chronic respiratory failure Trach/vent dependence  Severe sepsis secondary to CRE Klebsiella UTI with bacteremia AKI/?CKD:  H/o SDH (s/p mini crani/evac 12/2022 at [facility] c/b R cerebellar hemorrhage and seizures)  Acute  metabolic encephalopathy DM-2 w/ hyperglycemia: A little worse on 5/4 HTN, CAD, s/p CABG, S-CHD (EF at [facility] 44%)   Afib (off NOAC) GERD Severe PCM Acute anemia on chronic anemia of chronic illness: Intermittent fluid and electrolyte imbalance: hypernatremia, hyperchloremia:    Chronic respiratory failure w/ Trach/vent dependence, complicated by VAP, suspect CRE Klebsiella Prolonged course all stemming from SDH fall of 2024. Remains trach/vent dependent. His wife is hopeful he  can recover, but understands at this point it is very unlikely.  Palliative care is following  - Continue full vent support (4-8cc/kg IBW), weaning as able (tolerated PSV 12/5 on 5/6, more  coughing/discomfort today, higher RR, increased secretions - Wean FiO2 for O2 sat > 90% - Daily WUA/SBT as tolerated - VAP bundle - Trach care per protocol (cuffed Shiley #6/#4 at bedside in the event emergent replacement of Portex is indicated, backup Portex ordered) - additional dose of Lasix  40mg  today -Increase frequency of duonebs to q6hrs - HTS 3% nebs BID - Monitor I&Os - Pulmonary hygiene - Continue Avycaz  per ID, now has midline  - Appreciate ongoing PMT involvement/GOC discussion  Other issues per Primary Team (IMTS). Wife expresses wishes for [facility] versus another facility, does not want to return to [facility].  Best Practice (right click and "Reselect all SmartList Selections" daily)   Per Primary Team  Signature:   Patt Boozer Taffy Delconte, PA-C [facility] 07/17/23 8:28 AM  Please see [email or web address] for pager details.  From 7A-7P if no response, please call [phone] After hours, please call ELink [phone]

## 2023-07-17 NOTE — Progress Notes (Addendum)
 IMTS Cross Cover Note  RN reported frequent coughing and gagging with vomiting of TF. Also had temp of 101.2 axillary at 1500. Patient currently on Avycaz  for Klebsiella bacteremia. NGT placed and TF held. RN reports patient having BM and voiding today. IV Zofran given prior to arrival with symptom improvement and no further vomiting. Patient awakens on exam and appears in no acute distress. Abdominal exam stable, no guarding or grimacing. Remaining vitals stable. NGT to suction with TF noted in container.   Vitals:   07/17/23 1632 07/17/23 1700  BP:  (!) 154/93  Pulse:  96  Resp:  (!) 32  Temp:    SpO2: 97% 95%   Abdominal xray with enteric tip at gastric fundus, nonspecific bowel gas pattern. CXR with similar or slight progression of b/l opacities compared to prior and small b/l pleural effusions.   Plan: - Continue holding TF - Continue NGT LIWS  - Continue IV PPI  - IV Zofran PRN - Lidocaine nebulizer per PCCM with possible if needed bronch to evaluate trach placement  - Monitor fever curve, tylenol PRN - Blood cultures collected    Jearldine Mina, DO Internal Medicine Resident PGY-2 Please contact the on-call pager at [phone].

## 2023-07-17 NOTE — Progress Notes (Signed)
 Spoke with Theotis Flake, FNP with ID. Ok with midline placement,. PICC order to be cancelled.

## 2023-07-17 NOTE — Progress Notes (Signed)
 RN reports continued frequent coughing and gagging, increased burping and since vomited x 2 - TF appearing. No desaturations.  Primary team notified, NGT ordered.   - can try lidocaine neb x1 to see if helps with coughing, if this is post-tussive emesis and will discuss with attending about possible bronch to evaluate trach placement, possibly causing irritation.         Early Glisson, MSN, AG-ACNP-BC [facility] 07/17/2023, 4:46 PM  See Amion for pager If no response to pager , please call [phone] until 7pm After 7:00 pm call Elink  [phone]

## 2023-07-17 NOTE — Progress Notes (Signed)
 Subjective Opens eyes on command. Did not track or squeeze hand on command today.  Physical exam Blood pressure (!) 166/82, pulse 80, temperature 99.3 F (37.4 C), temperature source Axillary, resp. rate 19, height 5\' 6"  (1.676 m), weight 79 kg, SpO2 94%.  Constitutional: chronically ill-appearing, lying in bed on ventilator, in no acute distress Cardiovascular: regular rate and rhythm, no m/r/g, anasarca mildly improved in upper extremities Pulmonary/Chest: tachypneic, ventilator sounds to anterior auscultation, coarse Abdomen: more distention/firm today, no overt TTP   Weight change: -0.4 kg   Intake/Output Summary (Last 24 hours) at 07/17/2023 1113 Last data filed at 07/17/2023 1000 Gross per 24 hour  Intake 2684.88 ml  Output 1000 ml  Net 1684.88 ml   Net IO Since Admission: 1,786.23 mL [07/17/23 1113]  Labs, images, and other studies    Latest Ref Rng & Units 07/17/2023    4:23 AM 07/16/2023    9:39 AM 07/15/2023    2:52 AM  CBC  WBC 4.0 - 10.5 K/uL 11.3  10.7  7.6   Hemoglobin 13.0 - 17.0 g/dL 7.7  7.7  7.2   Hematocrit 39.0 - 52.0 % 25.7  25.9  24.7   Platelets 150 - 400 K/uL 213  221  177        Latest Ref Rng & Units 07/17/2023    4:23 AM 07/16/2023    9:39 AM 07/15/2023   12:01 PM  BMP  Glucose 70 - 99 mg/dL 914  96  92   BUN 8 - 23 mg/dL 782  956  97   Creatinine 0.61 - 1.24 mg/dL 2.13  0.86  5.78   Sodium 135 - 145 mmol/L 141  146  148   Potassium 3.5 - 5.1 mmol/L 3.9  4.3  4.3   Chloride 98 - 111 mmol/L 108  110  114   CO2 22 - 32 mmol/L 25  26  27    Calcium 8.9 - 10.3 mg/dL 8.4  8.8  8.2      Assessment and plan Hospital day 7  Albert R Zaydon Doetsch. is a 78 y.o.male chronic respiratory failure with tracheostomy since 2024 2/2 SDH, T2DM, CAD s/p CABG, atrial fibrillation not on anticoagulation, and anemia of chronic illness who is admitted from [facility] for sepsis due to CR Klebsiella bacteremia with AKI.    Klebsiella Bacteremia   Pulmonary Edema vs Infiltrates Chronic Respiratory Failure w/ Trach and Vent Dependence  Remains afebrile. Mild up-trending leukocytosis 7.6 > 10.7 > 11.3. Repeat BC NG 4 days. He has had increased secretions/tachypnea. Repeat DG chest yesterday without significant change from prior. Per PCCM, additional dose of Lasix  40 mg today, increase frequency of duonebs to q 6.  Avycaz  should cover any concern for PNA. -Continue Avycaz  (Day 7 with planned EOT 5/16). Midline has been placed this morning.  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 Free water has been decreased to 250 cc q 4. RN noted minimal urine output this morning. Foley was flushed with what appeared to be bloody output.. Hgb stable. Will check UA for hematuria and order bladder scan. Of note, dark urine is a rare but known side-effect of Avycaz . -Follow-up UA -Trend CBC/BMP  Abdominal Distension KUB unremarkable. He had a BM yesterday.  -CTM  T2DM  Stable. Continue basal 18 units, novolog  6 units  q 4 in addition to SSI.    Macrocytic anemia 2/2 chronic disease Stable at 7.7. Trend.   Hx of Atrial Fibrillation  Not a candidate for anticoagulation given history of SDH. Currently NSR.  -Continue cardiac monitoring   HTN Stable. Continue Norvasc    Goals of Care Disposition Per discussion with wife, Albert Vance, patient is now DNR but full scope of care remains. Per CSW today, "The SNF is saying that they have concerns about his hemoglobin. That they think he's bleeding from somewhere and can't accept him at this time. Albert Vance they would re-evaluate if he's more stable in a few days. I will send updated notes as available.".  Hgb has been stable s/p two units on admission and has mildly improved over the past few days . Will monitor and and follow-up UA.   [name], DO 07/17/2023, 11:13 AM  Pager: [phone] After 5pm or weekend: [phone]

## 2023-07-17 NOTE — Progress Notes (Signed)

## 2023-07-18 DIAGNOSIS — A419 Sepsis, unspecified organism: Secondary | ICD-10-CM | POA: Diagnosis not present

## 2023-07-18 DIAGNOSIS — Z7189 Other specified counseling: Secondary | ICD-10-CM | POA: Diagnosis not present

## 2023-07-18 DIAGNOSIS — J9610 Chronic respiratory failure, unspecified whether with hypoxia or hypercapnia: Secondary | ICD-10-CM | POA: Diagnosis not present

## 2023-07-18 DIAGNOSIS — R6520 Severe sepsis without septic shock: Secondary | ICD-10-CM | POA: Diagnosis not present

## 2023-07-18 DIAGNOSIS — E870 Hyperosmolality and hypernatremia: Secondary | ICD-10-CM | POA: Diagnosis not present

## 2023-07-18 DIAGNOSIS — Z515 Encounter for palliative care: Secondary | ICD-10-CM | POA: Diagnosis not present

## 2023-07-18 LAB — BASIC METABOLIC PANEL WITH GFR
Anion gap: 8 (ref 5–15)
BUN: 95 mg/dL — ABNORMAL HIGH (ref 8–23)
CO2: 27 mmol/L (ref 22–32)
Calcium: 8.3 mg/dL — ABNORMAL LOW (ref 8.9–10.3)
Chloride: 107 mmol/L (ref 98–111)
Creatinine, Ser: 1.52 mg/dL — ABNORMAL HIGH (ref 0.61–1.24)
GFR, Estimated: 46 mL/min — ABNORMAL LOW (ref >60)
Glucose, Bld: 88 mg/dL (ref 70–99)
Potassium: 3.2 mmol/L — ABNORMAL LOW (ref 3.5–5.1)
Sodium: 142 mmol/L (ref 135–145)

## 2023-07-18 LAB — CULTURE, BLOOD (ROUTINE X 2)
Culture: NO GROWTH
Culture: NO GROWTH
Special Requests: ADEQUATE
Special Requests: ADEQUATE

## 2023-07-18 LAB — GLUCOSE, CAPILLARY
Glucose-Capillary: 144 mg/dL — ABNORMAL HIGH (ref 70–99)
Glucose-Capillary: 191 mg/dL — ABNORMAL HIGH (ref 70–99)
Glucose-Capillary: 199 mg/dL — ABNORMAL HIGH (ref 70–99)
Glucose-Capillary: 251 mg/dL — ABNORMAL HIGH (ref 70–99)
Glucose-Capillary: 71 mg/dL (ref 70–99)
Glucose-Capillary: 81 mg/dL (ref 70–99)
Glucose-Capillary: 81 mg/dL (ref 70–99)

## 2023-07-18 LAB — PREPARE RBC (CROSSMATCH)

## 2023-07-18 LAB — CBC
HCT: 23.1 % — ABNORMAL LOW (ref 39.0–52.0)
Hemoglobin: 6.8 g/dL — CL (ref 13.0–17.0)
MCH: 30 pg (ref 26.0–34.0)
MCHC: 29.4 g/dL — ABNORMAL LOW (ref 30.0–36.0)
MCV: 101.8 fL — ABNORMAL HIGH (ref 80.0–100.0)
Platelets: 181 10*3/uL (ref 150–400)
RBC: 2.27 MIL/uL — ABNORMAL LOW (ref 4.22–5.81)
RDW: 19.9 % — ABNORMAL HIGH (ref 11.5–15.5)
WBC: 12 10*3/uL — ABNORMAL HIGH (ref 4.0–10.5)
nRBC: 0 % (ref 0.0–0.2)

## 2023-07-18 LAB — BILIRUBIN, FRACTIONATED(TOT/DIR/INDIR)
Bilirubin, Direct: 0.2 mg/dL (ref 0.0–0.2)
Indirect Bilirubin: 0.6 mg/dL (ref 0.3–0.9)
Total Bilirubin: 0.8 mg/dL (ref 0.0–1.2)

## 2023-07-18 LAB — LACTATE DEHYDROGENASE: LDH: 218 U/L — ABNORMAL HIGH (ref 98–192)

## 2023-07-18 MED ORDER — IPRATROPIUM-ALBUTEROL 0.5-2.5 (3) MG/3ML IN SOLN
3.0000 mL | Freq: Two times a day (BID) | RESPIRATORY_TRACT | Status: DC
  Administered 2023-07-19 – 2023-07-30 (×23): 3 mL via RESPIRATORY_TRACT
  Filled 2023-07-18 (×23): qty 3

## 2023-07-18 MED ORDER — SODIUM CHLORIDE 0.9% IV SOLUTION
Freq: Once | INTRAVENOUS | Status: AC
  Administered 2023-07-18: 10 mL/h via INTRAVENOUS

## 2023-07-18 MED ORDER — POTASSIUM CHLORIDE 20 MEQ PO PACK
40.0000 meq | PACK | Freq: Two times a day (BID) | ORAL | Status: AC
  Administered 2023-07-18 (×2): 40 meq
  Filled 2023-07-18 (×2): qty 2

## 2023-07-18 MED ORDER — DEXTROSE IN LACTATED RINGERS 5 % IV SOLN: INTRAVENOUS | Status: DC

## 2023-07-18 MED ORDER — POTASSIUM CHLORIDE 10 MEQ/100ML IV SOLN: 10.0000 meq | INTRAVENOUS | Status: DC

## 2023-07-18 MED ORDER — INSULIN ASPART 100 UNIT/ML IJ SOLN
0.0000 [IU] | INTRAMUSCULAR | Status: DC
  Administered 2023-07-18: 8 [IU] via SUBCUTANEOUS
  Administered 2023-07-18: 2 [IU] via SUBCUTANEOUS
  Administered 2023-07-18 – 2023-07-19 (×3): 3 [IU] via SUBCUTANEOUS
  Administered 2023-07-19: 2 [IU] via SUBCUTANEOUS
  Administered 2023-07-19 – 2023-07-20 (×6): 3 [IU] via SUBCUTANEOUS
  Administered 2023-07-20: 2 [IU] via SUBCUTANEOUS
  Administered 2023-07-21 (×2): 3 [IU] via SUBCUTANEOUS
  Administered 2023-07-21: 5 [IU] via SUBCUTANEOUS
  Administered 2023-07-21: 8 [IU] via SUBCUTANEOUS
  Administered 2023-07-21 – 2023-07-22 (×3): 5 [IU] via SUBCUTANEOUS
  Administered 2023-07-22: 2 [IU] via SUBCUTANEOUS
  Administered 2023-07-22 – 2023-07-23 (×4): 3 [IU] via SUBCUTANEOUS
  Administered 2023-07-23 (×2): 5 [IU] via SUBCUTANEOUS
  Administered 2023-07-23: 2 [IU] via SUBCUTANEOUS
  Administered 2023-07-23 (×2): 3 [IU] via SUBCUTANEOUS
  Administered 2023-07-24: 5 [IU] via SUBCUTANEOUS
  Administered 2023-07-24 (×3): 3 [IU] via SUBCUTANEOUS
  Administered 2023-07-24: 8 [IU] via SUBCUTANEOUS
  Administered 2023-07-25: 5 [IU] via SUBCUTANEOUS
  Administered 2023-07-25: 2 [IU] via SUBCUTANEOUS
  Administered 2023-07-25: 3 [IU] via SUBCUTANEOUS
  Administered 2023-07-25: 2 [IU] via SUBCUTANEOUS
  Administered 2023-07-25: 5 [IU] via SUBCUTANEOUS
  Administered 2023-07-25: 2 [IU] via SUBCUTANEOUS
  Administered 2023-07-26 (×2): 3 [IU] via SUBCUTANEOUS
  Administered 2023-07-26: 5 [IU] via SUBCUTANEOUS
  Administered 2023-07-26: 2 [IU] via SUBCUTANEOUS
  Administered 2023-07-26: 3 [IU] via SUBCUTANEOUS
  Administered 2023-07-26 – 2023-07-27 (×2): 2 [IU] via SUBCUTANEOUS
  Administered 2023-07-27: 3 [IU] via SUBCUTANEOUS
  Administered 2023-07-27 (×2): 2 [IU] via SUBCUTANEOUS
  Administered 2023-07-28 (×2): 5 [IU] via SUBCUTANEOUS
  Administered 2023-07-28: 3 [IU] via SUBCUTANEOUS
  Administered 2023-07-28: 2 [IU] via SUBCUTANEOUS
  Administered 2023-07-29 (×2): 3 [IU] via SUBCUTANEOUS
  Administered 2023-07-29: 2 [IU] via SUBCUTANEOUS
  Administered 2023-07-29: 5 [IU] via SUBCUTANEOUS
  Administered 2023-07-29 – 2023-07-30 (×3): 3 [IU] via SUBCUTANEOUS
  Administered 2023-07-30: 5 [IU] via SUBCUTANEOUS

## 2023-07-18 MED ORDER — INSULIN GLARGINE-YFGN 100 UNIT/ML ~~LOC~~ SOLN
8.0000 [IU] | Freq: Every day | SUBCUTANEOUS | Status: DC
  Administered 2023-07-18: 8 [IU] via SUBCUTANEOUS
  Filled 2023-07-18 (×2): qty 0.08

## 2023-07-18 NOTE — Progress Notes (Signed)
 Daily Progress Note   Patient Name: Albert Vance.       Date: 07/18/2023 DOB: 10-14-1945  Age: 78 y.o. MRN#: [id number] Attending Physician: Arlyne Bering, MD Primary Care Physician: Jerral Moos, MD Admit Date: 07/10/2023  Reason for Consultation/Follow-up: Establishing goals of care   Length of Stay: 8  Current Medications: Scheduled Meds:   amantadine   100 mg Per Tube Daily   amLODipine   10 mg Per Tube Daily   brimonidine   1 drop Both Eyes Q8H   Chlorhexidine  Gluconate Cloth  6 each Topical Daily   enoxaparin  (LOVENOX ) injection  40 mg Subcutaneous Daily   feeding supplement (PROSource TF20)  60 mL Per Tube BID   free water   250 mL Per Tube Q4H   insulin  aspart  0-15 Units Subcutaneous Q4H   ipratropium-albuterol   3 mL Nebulization Q4H   modafinil   100 mg Per Tube Daily   nutrition supplement (JUVEN)  1 packet Per Tube BID BM   mouth rinse  15 mL Mouth Rinse Q2H   pantoprazole  (PROTONIX ) IV  40 mg Intravenous Daily   polyvinyl alcohol   1 drop Both Eyes Q4H   potassium chloride   40 mEq Per Tube BID   sodium chloride  HYPERTONIC  4 mL Nebulization BID    Continuous Infusions:  ceftazidime -avibactam (AVYCAZ ) 1.25 g in dextrose  5 % 50 mL IVPB Stopped (07/18/23 0844)   feeding supplement (OSMOLITE 1.5 CAL) 1,000 mL (07/18/23 1132)    PRN Meds: acetaminophen , clonazePAM , ipratropium-albuterol , ondansetron  (ZOFRAN ) IV, mouth rinse, mouth rinse, oxyCODONE   Physical Exam Vitals reviewed.  Constitutional:      General: He is not in acute distress.    Appearance: He is ill-appearing.     Comments: Generalized edema  Cardiovascular:     Rate and Rhythm: Normal rate.  Pulmonary:     Comments: ventillator            Vital Signs: BP (!) 155/80   Pulse 80    Temp 97.9 F (36.6 C)   Resp (!) 22   Ht 5\' 6"  (1.676 m)   Wt 79.8 kg   SpO2 94%   BMI 28.40 kg/m  SpO2: SpO2: 94 % O2 Device: O2 Device: Ventilator O2 Flow Rate:         Palliative Assessment/Data: 30%  Patient Active Problem List   Diagnosis Date Noted   Goals of care, counseling/discussion 07/15/2023   Carbapenem-resistant bacterial infection 07/14/2023   Hypoalbuminemia 07/11/2023   Klebsiella sepsis (HCC) 07/11/2023   Hypernatremia 07/10/2023   AKI (acute kidney injury) (HCC) 07/10/2023   Ventilator dependent (HCC) 07/10/2023   Symptomatic anemia 07/10/2023   Anasarca 07/10/2023   Pressure injury of skin of left heel 07/10/2023    Palliative Care Assessment & Plan   Patient Profile: 78 y.o. 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 Found to be septic due to CRE klebsiella UTI and bacteremia with AKI.    Today's Discussion: Received updates from nursing and CCM. Patient sleeping in NAD. Patient's wife at bedside. Last night the patient vomited and had a temperature. Today he is receiving a blood transfusion. Patient's wife shared that patient has been sleeping today. She is surprised that the patient's skin is weeping.  Patient's wife continues to remain hopeful. When we discuss goals of care and decision making she shared that she hopes the lord makes the decision for the patient. She has a hard time thinking about making any decisions toward comfort care. She continues to want full scope of care. We discussed that he has made little progress during this hospitalization and over the last 6 months when he was at [facility]-- despite full medical support. She acknowledged this. She shared that she does feel good about making the patient DNR.  Provided emotional  support and answered questions. Discussed the importance of considering his quality of life, overall suffering, what would be acceptable to him in the future when making decisions. Encouraged patient's wife to call PMT with questions or concerns.   Recommendations/Plan: DNR Full scope of care: Patient's wife open to all interventions PMT will continue to follow   Code Status:    Code Status Orders  (From admission, onward)           Start     Ordered   07/15/23 1405  Do not attempt resuscitation (DNR) Pre-Arrest Interventions Desired  (Code Status)  Continuous       Question Answer Comment  If pulseless and not breathing No CPR or chest compressions.   In Pre-Arrest Conditions (Patient Has Pulse and Is Breathing) May intubate, use advanced airway interventions and cardioversion/ACLS medications if appropriate or indicated. May transfer to ICU.   Consent: Discussion documented in EHR or advanced directives reviewed      07/15/23 1406         Extensive chart review has been completed prior to seeing the patient including labs, vital signs, imaging, progress/consult notes, orders, medications, and available advance directive documents.  Care plan was discussed with bedside RN and CCM NP  Time spent: 50 minutes  Thank you for allowing the Palliative Medicine Team to assist in the care of this patient.     Daina Drum, NP  Please contact Palliative Medicine Team phone at [phone] for questions and concerns.

## 2023-07-18 NOTE — Progress Notes (Signed)
 NAME:  Albert Clem., MRN:  [id number], DOB:  [birth year], LOS: 8 ADMISSION DATE:  07/10/2023, CONSULTATION DATE:  07/10/2023 REFERRING MD:  Scarlette Currier - EDP, CHIEF COMPLAINT: Abnormal labs   History of Present Illness:  A 78 yr old male patient with hx of SDH (s/p mini crani/evac 12/2022 at [facility] c/b R cerebellar hemorrhage and seizures), multiple aspiration PNA, failed extubation, chronic resp failure requiring trach/PEG, PTSD, DM-2, HTN, CAD, GERD, ? Dementia, Afib (off NOAC), PCM, and anemia of chronic illness. He has been a resident of [facility] since 01/2023 when he was dc from [facility] for SDH/sequelae of. He presented to [facility] ED 5/1 from [facility] for further evaluation of abnormal labs -- noted to have elevated Cr and low Hgb. Foley exchanged. Given one unit of PRBC and 1 L NS 0.9% bolus.   On stable vent settings (PRVC 20/510/+5/40%) and without trach complications.  PCCM consulted for evaluation in this setting.  Pertinent Medical History:  SDH, seizures (weaned off Keppra), multiple aspiration PNA, failed extubation, chronic resp failure requiring trach/PEG, PTSD, DM-2, HTN, CAD, GERD, ? Dementia, Afib (off NOAC), PCM, and anemia of chronic illness  Significant Hospital Events: Including procedures, antibiotic start and stop dates in addition to other pertinent events   5/1 1U PRBC for hgb 6.6. PCCM consult Foley exchanged in the ED here. 5/2 Cultures positive for klebsiella with CRE. 5/8 Remains on PRVC, Wife met with palliative, DNR but continue current interventions  5/9 Remains on vent via trach, hgb dropped to 6.8 one unit PRBC ordered   Interim History / Subjective:  No family at bedside Unable to follow commands    Objective:  Blood pressure (!) 154/85, pulse 73, temperature 97.9 F (36.6 C), temperature source Axillary, resp. rate (!) 9, height 5\' 6"  (1.676 m), weight 79.8 kg, SpO2 97%.    Vent Mode: PRVC FiO2 (%):  [40 %-50 %] 40 % Set Rate:  [20 bmp] 20 bmp Vt Set:  [510  mL] 510 mL PEEP:  [5 cmH20] 5 cmH20 Plateau Pressure:  [25 cmH20-26 cmH20] 25 cmH20   Intake/Output Summary (Last 24 hours) at 07/18/2023 2841 Last data filed at 07/18/2023 0800 Gross per 24 hour  Intake 1413.58 ml  Output 1050 ml  Net 363.58 ml   Filed Weights   07/16/23 0500 07/17/23 0500 07/18/23 0500  Weight: 79.4 kg 79 kg 79.8 kg   Physical Examination: General: Acute on chronically ill appearing elderly deconditioned elderly male lying in bed on mechanical ventilation, in NAD HEENT: Trach midline, MM pink/moist, PERRL,  Neuro: Unable to follow commands, seen spontaneously moving upper extremities CV: s1s2 regular rate and rhythm, no murmur, rubs, or gallops,  PULM:  Clear to auscultation bilaterally, no increased work of breathing, no added breath sounds GI: soft, bowel sounds active in all 4 quadrants, non-tender, non-distended Extremities: warm/dry, generalized edema  Skin: no rashes or lesions  Resolved Hospital Problem List:    Assessment & Plan:  Severe sepsis secondary to CRE Klebsiella UTI with bacteremia -Foley exchanged on arrival P: Continue Avycaz   Follow blood cultures  Chronic respiratory failure w/ Trach/vent dependence, complicated by VAP, CRE Klebsiella -Prolonged course all stemming from SDH fall of 2024. Remains trach/vent dependent. His wife is hopeful he can recover, but understands at this point it is very unlikely.  Palliative care is following  P: Continue ventilator support with lung protective strategies  Wean PEEP and FiO2 for sats greater than 90%. Head of bed elevated 30 degrees. Plateau pressures  less than 30 cm H20.  Follow intermittent chest x-ray and ABG.   SAT/SBT as tolerated, mentation preclude extubation  Ensure adequate pulmonary hygiene  Follow cultures  VAP bundle in place  PAD protocol  Acute kidney injury seen on admission, unsure of CKD history  - Creatinine on admission 2.53 with GFR 25 compared to creatinine 1.03 with GFR  greater than 60 February 2021 P: Follow renal function Monitor urine output Trend Bmet Avoid nephrotoxins Ensure adequate renal perfusion   H/o SDH  -s/p mini crani/evac 12/2022 at [facility] c/b R cerebellar hemorrhage and seizures Acute  metabolic encephalopathy P: Maintain neuro protective measures Nutrition and bowel regiment  Aspirations precautions  Delirium precautions   DM-2 w/ hyperglycemia - A little worse on 5/4 P: SSI  CBG checks q4  CBG goal 140-180  Essential hypertension CAD s/p CABG HFrEF - (EF at [facility] 44%)   Afib (off NOAC) P: Continuous telemetry  Continue Norvasc  Strict intake and output  Daily weight to assess volume status Daily assessment for need to diurese  Closely monitor renal function and electrolytes   GERD P: PPI  Severe protein calorie malnutrition -Episode of emesis 5/8, TF held  P: Resume trickle tube feeds today   Acute anemia on chronic anemia of chronic illness vs slow GI bleed/urine losses  -Iron panel normal on admit  P: Trend CBC  Transfuse per protocol  Hgb goal > 7 Consider abdominal CT   Best Practice (right click and "Reselect all SmartList Selections" daily)   Per Primary Team  Signature:  CRITICAL CARE Performed by: Julie Paolini D. Harris   Total critical care time: 38 minutes  Critical care time was exclusive of separately billable procedures and treating other patients.  Critical care was necessary to treat or prevent imminent or life-threatening deterioration.  Critical care was time spent personally by me on the following activities: development of treatment plan with patient and/or surrogate as well as nursing, discussions with consultants, evaluation of patient's response to treatment, examination of patient, obtaining history from patient or surrogate, ordering and performing treatments and interventions, ordering and review of laboratory studies, ordering and review of radiographic studies, pulse oximetry  and re-evaluation of patient's condition.'  Myranda Pavone D. Harris, NP-C [facility] Personal contact information can be found on Amion  If no contact or response made please call [phone] 07/18/2023, 10:51 AM

## 2023-07-18 NOTE — Progress Notes (Signed)
 Nutrition Follow-up  DOCUMENTATION CODES:  Not applicable  INTERVENTION:  TF via PEG tube: Resume Osmolite 1.5 at 2ml/hr and advance by 10ml q6h to goal rate of 59ml/hr ( per day) 60ml ProSource TF20 BID Provides 1780 kcal, 108g protein and free water  daily  Free water  flushes 250ml q4h per MD ( per day) Total free water  (TF+FWF) - 2323 ml per day  Continue Juven BID to support wound healing  NUTRITION DIAGNOSIS:  Increased nutrient needs related to acute illness, wound healing as evidenced by estimated needs. - remains applicable  GOAL:  Patient will meet greater than or equal to 90% of their needs - addressing via TF  MONITOR:  Vent status, Labs, Weight trends, I & O's, Skin, TF tolerance  REASON FOR ASSESSMENT:  Ventilator, Consult Enteral/tube feeding initiation and management, Wound healing  ASSESSMENT:  Pt admitted from [facility] with abnormal labs (elevated Cr and low hgb). PMH significant for SDH ( s/p mini crani/evac 12/2022 at [facility] c/b cerebellar hemorrhage and seizures), multiple aspiration PNA, chronic respiratory failure requiring trach/PEG, DM2, HTN, CAD, GERD, afib, anemia of chronic illness.  Patient is currently intubated on ventilator support MV: 10.1 L/min Temp (24hrs), Avg:98.1 F (36.7 C), Min:97.9 F (36.6 C), Max:98.6 F (37 C)  Pt sleeping at time of visit.  PMT continues to follow for ongoing GOC discussions.  Remains with deep pitting BUE, BLE edema. +lasix  KUB ordered yesterday d/t abdominal distension (unremarkable).   TF held yesterday d/t pt with coughing leading to emesis. NGT placed to suction. Minimal output in canister at time of visit.   Spoke with CCM, plans to resume TF today as pt with no further episodes of emesis. Request to resume at trickle with titration orders.   Hypernatremia now improved with free water  flushes.   Admit weight: 79.4 kg Current weight: 79.8 kg + edema  Drains/lines: UOP: x24  hours PEG Midline single lumen  Medications: SSI 0-15 units q6h, protonix , klor-con   Labs:  Potassium 3.2 BUN 95 Cr 1.52 GFR 46 CBG's 58-118 x24 hours  Diet Order:   Diet Order             Diet NPO time specified  Diet effective now                   EDUCATION NEEDS:   Education needs have been addressed  Skin:  Skin Assessment: Skin Integrity Issues: Skin Integrity Issues:: Stage II, Unstageable, Stage III Stage II: bilateral buttocks Stage III: L ankle Unstageable: L heel  Last BM:  5/8 type 6, type 7  Height:   Ht Readings from Last 1 Encounters:  07/17/23 5\' 6"  (1.676 m)    Weight:   Wt Readings from Last 1 Encounters:  07/18/23 79.8 kg    Ideal Body Weight:  64.5 kg  BMI:  Body mass index is 28.4 kg/m.  Estimated Nutritional Needs:   Kcal:  1700-1900  Protein:  100-115g  Fluid:  >/=1.7L  Rocklin Chute, RDN, LDN Clinical Nutrition See AMiON for contact information.

## 2023-07-18 NOTE — Progress Notes (Signed)
 eLink Physician-Brief Progress Note Patient Name: Albert Vance. DOB: 1946/02/05 MRN: [id number]   Date of Service  07/18/2023  HPI/Events of Note  Hemoglobin 6.8 gm / dl, no evidence of active bleeding.  eICU Interventions  One unit PRBC ordered transfused.        Yessenia Maillet U Carliyah Cotterman 07/18/2023, 6:11 AM

## 2023-07-18 NOTE — Progress Notes (Signed)
 Evaluated patient today who is HDS. Per discussion with PCCM, they will now be primary. IMTS signing off for now.

## 2023-07-19 ENCOUNTER — Encounter (HOSPITAL_COMMUNITY): Payer: Self-pay | Admitting: Infectious Diseases

## 2023-07-19 ENCOUNTER — Other Ambulatory Visit: Payer: Self-pay

## 2023-07-19 DIAGNOSIS — J9610 Chronic respiratory failure, unspecified whether with hypoxia or hypercapnia: Secondary | ICD-10-CM | POA: Diagnosis not present

## 2023-07-19 DIAGNOSIS — A419 Sepsis, unspecified organism: Secondary | ICD-10-CM | POA: Diagnosis not present

## 2023-07-19 DIAGNOSIS — E870 Hyperosmolality and hypernatremia: Secondary | ICD-10-CM | POA: Diagnosis not present

## 2023-07-19 DIAGNOSIS — R6520 Severe sepsis without septic shock: Secondary | ICD-10-CM | POA: Diagnosis not present

## 2023-07-19 LAB — CBC
HCT: 25.3 % — ABNORMAL LOW (ref 39.0–52.0)
Hemoglobin: 7.7 g/dL — ABNORMAL LOW (ref 13.0–17.0)
MCH: 30.2 pg (ref 26.0–34.0)
MCHC: 30.4 g/dL (ref 30.0–36.0)
MCV: 99.2 fL (ref 80.0–100.0)
Platelets: 167 10*3/uL (ref 150–400)
RBC: 2.55 MIL/uL — ABNORMAL LOW (ref 4.22–5.81)
RDW: 20.2 % — ABNORMAL HIGH (ref 11.5–15.5)
WBC: 10.8 10*3/uL — ABNORMAL HIGH (ref 4.0–10.5)
nRBC: 0.4 % — ABNORMAL HIGH (ref 0.0–0.2)

## 2023-07-19 LAB — BASIC METABOLIC PANEL WITH GFR
Anion gap: 11 (ref 5–15)
BUN: 96 mg/dL — ABNORMAL HIGH (ref 8–23)
CO2: 21 mmol/L — ABNORMAL LOW (ref 22–32)
Calcium: 8.6 mg/dL — ABNORMAL LOW (ref 8.9–10.3)
Chloride: 112 mmol/L — ABNORMAL HIGH (ref 98–111)
Creatinine, Ser: 1.53 mg/dL — ABNORMAL HIGH (ref 0.61–1.24)
GFR, Estimated: 46 mL/min — ABNORMAL LOW (ref >60)
Glucose, Bld: 164 mg/dL — ABNORMAL HIGH (ref 70–99)
Potassium: 4.6 mmol/L (ref 3.5–5.1)
Sodium: 144 mmol/L (ref 135–145)

## 2023-07-19 LAB — BPAM RBC
Blood Product Expiration Date: 202506082359
ISSUE DATE / TIME: 202505091057
Unit Type and Rh: 6200

## 2023-07-19 LAB — TYPE AND SCREEN
ABO/RH(D): A POS
Antibody Screen: NEGATIVE
Unit division: 0

## 2023-07-19 LAB — GLUCOSE, CAPILLARY
Glucose-Capillary: 185 mg/dL — ABNORMAL HIGH (ref 70–99)
Glucose-Capillary: 167 mg/dL — ABNORMAL HIGH (ref 70–99)
Glucose-Capillary: 147 mg/dL — ABNORMAL HIGH (ref 70–99)
Glucose-Capillary: 114 mg/dL — ABNORMAL HIGH (ref 70–99)
Glucose-Capillary: 115 mg/dL — ABNORMAL HIGH (ref 70–99)
Glucose-Capillary: 181 mg/dL — ABNORMAL HIGH (ref 70–99)

## 2023-07-19 MED ORDER — OLANZAPINE 10 MG IM SOLR
2.5000 mg | Freq: Once | INTRAMUSCULAR | Status: AC | PRN
  Administered 2023-07-19: 2.5 mg via INTRAMUSCULAR
  Filled 2023-07-19: qty 10

## 2023-07-19 MED ORDER — INSULIN GLARGINE-YFGN 100 UNIT/ML ~~LOC~~ SOLN
10.0000 [IU] | Freq: Every day | SUBCUTANEOUS | Status: DC
  Administered 2023-07-19 – 2023-07-21 (×3): 10 [IU] via SUBCUTANEOUS
  Filled 2023-07-19 (×4): qty 0.1

## 2023-07-19 MED ORDER — QUETIAPINE FUMARATE 25 MG PO TABS
25.0000 mg | ORAL_TABLET | Freq: Three times a day (TID) | ORAL | Status: DC | PRN
  Administered 2023-07-19 – 2023-07-28 (×13): 25 mg
  Filled 2023-07-19 (×13): qty 1

## 2023-07-19 NOTE — Progress Notes (Signed)
 Telephone consent obtained for PICC placement.  Wife aware plan on PICC placement Sunday.

## 2023-07-19 NOTE — Progress Notes (Addendum)
 NAME:  Albert Banfill., MRN:  [id number], DOB:  06-15-1945, LOS: 9 ADMISSION DATE:  07/10/2023, CONSULTATION DATE:  07/10/2023 REFERRING MD:  Scarlette Currier - EDP, CHIEF COMPLAINT: Abnormal labs   History of Present Illness:  A 78 yr old male patient with hx of SDH (s/p mini crani/evac 12/2022 at [facility] c/b R cerebellar hemorrhage and seizures), multiple aspiration PNA, failed extubation, chronic resp failure requiring trach/PEG, PTSD, DM-2, HTN, CAD, GERD, ? Dementia, Afib (off NOAC), PCM, and anemia of chronic illness. He has been a resident of [facility] since 01/2023 when he was dc from [facility] for SDH/sequelae of. He presented to [facility] ED 5/1 from [facility] for further evaluation of abnormal labs -- noted to have elevated Cr and low Hgb. Foley exchanged. Given one unit of PRBC and 1 L NS 0.9% bolus.   On stable vent settings (PRVC 20/510/+5/40%) and without trach complications.  PCCM consulted for evaluation in this setting.  Pertinent Medical History:  SDH, seizures (weaned off Keppra), multiple aspiration PNA, failed extubation, chronic resp failure requiring trach/PEG, PTSD, DM-2, HTN, CAD, GERD, ? Dementia, Afib (off NOAC), PCM, and anemia of chronic illness  Significant Hospital Events: Including procedures, antibiotic start and stop dates in addition to other pertinent events   5/1 1U PRBC for hgb 6.6. PCCM consult Foley exchanged in the ED here. 5/2 Cultures positive for klebsiella with CRE. 5/8 Remains on PRVC, Wife met with palliative, DNR but continue current interventions  5/9 Remains on vent via trach, hgb dropped to 6.8 one unit PRBC ordered  5/10: restless. On TF, water  flushes 250 cc q 4 hrs, Na 144. MV: PRVC 20/510/+5/40%. Failed PSV 07/15/2023, Positive 1.5 L fluid balance in 7 days  Interim History / Subjective:  No family at bedside Unable to follow commands    Objective:  Blood pressure (!) 170/71, pulse 84, temperature (!) 101.3 F (38.5 C), temperature source Axillary, resp. rate  (!) 35, height 5\' 6"  (1.676 m), weight 78.1 kg, SpO2 95%.    Vent Mode: PRVC FiO2 (%):  [40 %] 40 % Set Rate:  [20 bmp] 20 bmp Vt Set:  [510 mL] 510 mL PEEP:  [5 cmH20] 5 cmH20   Intake/Output Summary (Last 24 hours) at 07/19/2023 1330 Last data filed at 07/19/2023 1100 Gross per 24 hour  Intake 1869.71 ml  Output 1550 ml  Net 319.71 ml   Filed Weights   07/17/23 0500 07/18/23 0500 07/19/23 0200  Weight: 79 kg 79.8 kg 78.1 kg   Physical Examination: Afebrile. V/S are stable  PERL Trach Shiley #6 cuffed Lungs: Less crackles. No wheezing. Symmetrical air entry Heart: NL S1/S2, no m/g/r Abd: no distension or tenderness. PEG tube LL: +1 edema, symmetrical  CNS: Opens eyes to verbal stimulation. Tracking. Does not follow commands. Some spontaneous non-purposeful upper extremity movements  Resolved Hospital Problem List:    Assessment & Plan:  Chronic respiratory failure Trach/vent dependence  -Cont MV support, routine trach care. He has been in the same setting for 6 months and bedridden. He will not have meaningful life. Will try weaning after diuresis. Wife agreed to DNR, not comfort care. Palliative care consult  -VAP pulm hygiene and with lung protective strategies -07/11/2023 Echo: EF 50%, ?diastolic dysfunction.  -Duoneb -Head of bed elevated 30 degrees. -Plateau pressures less than 30 cm H20.  -Follow intermittent chest x-ray and ABG.   -SAT/SBT as tolerated, mentation preclude extubation  -Ensure adequate pulmonary hygiene  -PAD protocol   HyperNa: resolved and Na  level is stable -same free water  flushes -Am labs   AKI/?CKD: Cr is stable -s/p IV Lasix  -Am labs -Foley exchanged in ED.  -Renal U/S: negative    SDH (s/p mini crani/evac 12/2022 at [facility] c/b R cerebellar hemorrhage and seizures) and metabolic encephalopathy, and Dementia -delirium precautions  -minimize CNS depressing meds  -Ammonia level: NL -Seroquel PRN    DM-2: HbA1c (7.9%) -Glycemic  control -Lispro and Lantus . No more hypoglycemic episodes      HTN, CAD, s/p CABG, S-CHF   Afib (off NOAC) 07/11/2023 Echo: EF 50%, possible diastolic dysfunction. -Monitor BP and HR   GERD -PPI   Severe PCM -Dietitian consult   Acute anemia on chronic anemia of chronic illness: abd wall hematomas  -SCD -Blood Tx for Hb>7, 1 unit PRBC Tx. All anemia w/u indicates slow MSK hematomas, GI loss or renal loss.     Severe sepsis, CRE Klebsiella UTI and bacteremia: Foley exchanged on arrival -Continue Avycaz  for 2 weeks -Repeat Bcx2 07/13/2023: negative -Repeat Bcx 07/18/2023 per ID: pending  -Seen by ID  Best Practice (right click and "Reselect all SmartList Selections" daily)  Diet/type: tube feeds DVT prophylaxis LMWH Pressure ulcer(s): N/A GI prophylaxis: PPI Lines: midline, will order PICC line due to issues with blood draw from the midline.  Foley: Yes, and it is still needed Code Status: DNR Last date of multidisciplinary goals of care discussion []   Signature:  CRITICAL CARE Madelynn Schilder, MD [facility] Office: [phone]  Total critical care time: 38 minutes

## 2023-07-19 NOTE — Plan of Care (Signed)
  Problem: Activity: Goal: Ability to tolerate increased activity will improve Outcome: Progressing   Problem: Respiratory: Goal: Ability to maintain a clear airway and adequate ventilation will improve Outcome: Progressing   Problem: Role Relationship: Goal: Method of communication will improve Outcome: Progressing   Problem: Education: Goal: Knowledge of General Education information will improve Description: Including pain rating scale, medication(s)/side effects and non-pharmacologic comfort measures Outcome: Progressing   Problem: Clinical Measurements: Goal: Will remain free from infection Outcome: Progressing Goal: Diagnostic test results will improve Outcome: Progressing Goal: Respiratory complications will improve Outcome: Progressing Goal: Cardiovascular complication will be avoided Outcome: Progressing   Problem: Activity: Goal: Risk for activity intolerance will decrease Outcome: Progressing   Problem: Nutrition: Goal: Adequate nutrition will be maintained Outcome: Progressing   Problem: Coping: Goal: Level of anxiety will decrease Outcome: Progressing   Problem: Elimination: Goal: Will not experience complications related to bowel motility Outcome: Progressing Goal: Will not experience complications related to urinary retention Outcome: Progressing   Problem: Pain Managment: Goal: General experience of comfort will improve and/or be controlled Outcome: Progressing   Problem: Safety: Goal: Ability to remain free from injury will improve Outcome: Progressing   Problem: Skin Integrity: Goal: Risk for impaired skin integrity will decrease Outcome: Progressing

## 2023-07-19 NOTE — Plan of Care (Signed)
  Problem: Respiratory: Goal: Ability to maintain a clear airway and adequate ventilation will improve Outcome: Progressing   Problem: Clinical Measurements: Goal: Will remain free from infection Outcome: Progressing Goal: Diagnostic test results will improve Outcome: Progressing Goal: Respiratory complications will improve Outcome: Progressing Goal: Cardiovascular complication will be avoided Outcome: Progressing   Problem: Activity: Goal: Risk for activity intolerance will decrease Outcome: Progressing   Problem: Coping: Goal: Level of anxiety will decrease Outcome: Progressing   Problem: Elimination: Goal: Will not experience complications related to bowel motility Outcome: Progressing Goal: Will not experience complications related to urinary retention Outcome: Progressing   Problem: Pain Managment: Goal: General experience of comfort will improve and/or be controlled Outcome: Progressing   Problem: Safety: Goal: Ability to remain free from injury will improve Outcome: Progressing   Problem: Skin Integrity: Goal: Risk for impaired skin integrity will decrease Outcome: Progressing

## 2023-07-20 ENCOUNTER — Inpatient Hospital Stay (HOSPITAL_COMMUNITY)

## 2023-07-20 DIAGNOSIS — E870 Hyperosmolality and hypernatremia: Secondary | ICD-10-CM | POA: Diagnosis not present

## 2023-07-20 DIAGNOSIS — J9610 Chronic respiratory failure, unspecified whether with hypoxia or hypercapnia: Secondary | ICD-10-CM | POA: Diagnosis not present

## 2023-07-20 DIAGNOSIS — Z515 Encounter for palliative care: Secondary | ICD-10-CM | POA: Diagnosis not present

## 2023-07-20 DIAGNOSIS — A419 Sepsis, unspecified organism: Secondary | ICD-10-CM | POA: Diagnosis not present

## 2023-07-20 DIAGNOSIS — R6520 Severe sepsis without septic shock: Secondary | ICD-10-CM | POA: Diagnosis not present

## 2023-07-20 LAB — GLUCOSE, CAPILLARY
Glucose-Capillary: 134 mg/dL — ABNORMAL HIGH (ref 70–99)
Glucose-Capillary: 151 mg/dL — ABNORMAL HIGH (ref 70–99)
Glucose-Capillary: 177 mg/dL — ABNORMAL HIGH (ref 70–99)
Glucose-Capillary: 178 mg/dL — ABNORMAL HIGH (ref 70–99)
Glucose-Capillary: 181 mg/dL — ABNORMAL HIGH (ref 70–99)
Glucose-Capillary: 196 mg/dL — ABNORMAL HIGH (ref 70–99)

## 2023-07-20 LAB — HAPTOGLOBIN: Haptoglobin: 241 mg/dL (ref 34–355)

## 2023-07-20 MED ORDER — SODIUM CHLORIDE 0.9% FLUSH: 10.0000 mL | INTRAVENOUS | Status: DC | PRN

## 2023-07-20 MED ORDER — FUROSEMIDE 10 MG/ML IJ SOLN
40.0000 mg | Freq: Four times a day (QID) | INTRAMUSCULAR | Status: AC
  Administered 2023-07-20 (×2): 40 mg via INTRAVENOUS
  Filled 2023-07-20 (×2): qty 4

## 2023-07-20 MED ORDER — SODIUM CHLORIDE 0.9% FLUSH
10.0000 mL | Freq: Two times a day (BID) | INTRAVENOUS | Status: DC
  Administered 2023-07-20 – 2023-07-30 (×18): 10 mL

## 2023-07-20 NOTE — Progress Notes (Signed)
 NAME:  Albert Contreraz., MRN:  [id number], DOB:  January 12, 1946, LOS: 10 ADMISSION DATE:  07/10/2023, CONSULTATION DATE:  07/10/2023 REFERRING MD:  Scarlette Currier - EDP, CHIEF COMPLAINT: Abnormal labs   History of Present Illness:  A 78 yr old male patient with hx of SDH (s/p mini crani/evac 12/2022 at [facility] c/b R cerebellar hemorrhage and seizures), multiple aspiration PNA, failed extubation, chronic resp failure requiring trach/PEG, PTSD, DM-2, HTN, CAD, GERD, ? Dementia, Afib (off NOAC), PCM, and anemia of chronic illness. He has been a resident of [facility] since 01/2023 when he was dc from [facility] for SDH/sequelae of. He presented to [facility] ED 5/1 from [facility] for further evaluation of abnormal labs -- noted to have elevated Cr and low Hgb. Foley exchanged. Given one unit of PRBC and 1 L NS 0.9% bolus.   On stable vent settings (PRVC 20/510/+5/40%) and without trach complications.  PCCM consulted for evaluation in this setting.  Pertinent Medical History:  SDH, seizures (weaned off Keppra), multiple aspiration PNA, failed extubation, chronic resp failure requiring trach/PEG, PTSD, DM-2, HTN, CAD, GERD, ? Dementia, Afib (off NOAC), PCM, and anemia of chronic illness  Significant Hospital Events: Including procedures, antibiotic start and stop dates in addition to other pertinent events   5/1 1U PRBC for hgb 6.6. PCCM consult Foley exchanged in the ED here. 5/2 Cultures positive for klebsiella with CRE. 5/8 Remains on PRVC, Wife met with palliative, DNR but continue current interventions  5/9 Remains on vent via trach, hgb dropped to 6.8 one unit PRBC ordered  5/10: restless. On TF, water  flushes 250 cc q 4 hrs, Na 144. MV: PRVC 20/510/+5/40%. Failed PSV 07/15/2023, Positive 1.5 L fluid balance in 7 days 5/11: less restless. MV: PRVC 20/510/+5/40%. Failed PSV 07/15/2023, Positive 3.3 L fluid balance in 8 days  Interim History / Subjective:  No family at bedside Unable to follow commands    Objective:  Blood  pressure (!) 147/70, pulse 86, temperature 100.2 F (37.9 C), temperature source Axillary, resp. rate (!) 24, height 5\' 6"  (1.676 m), weight 79 kg, SpO2 92%.    Vent Mode: PRVC FiO2 (%):  [40 %] 40 % Set Rate:  [20 bmp] 20 bmp Vt Set:  [510 mL] 510 mL PEEP:  [5 cmH20] 5 cmH20   Intake/Output Summary (Last 24 hours) at 07/20/2023 1123 Last data filed at 07/20/2023 1000 Gross per 24 hour  Intake 3816.23 ml  Output 1820 ml  Net 1996.23 ml   Filed Weights   07/18/23 0500 07/19/23 0200 07/20/23 0452  Weight: 79.8 kg 78.1 kg 79 kg   Physical Examination: Afebrile. V/S are stable  PERL Trach Shiley #6 cuffed Lungs: Less crackles. No wheezing. Symmetrical air entry Heart: NL S1/S2, no m/g/r Abd: no distension or tenderness. PEG tube LL: +1 edema, symmetrical  CNS: Opens eyes to verbal stimulation. Tracking. Does not follow commands. Some spontaneous non-purposeful upper extremity movements  Resolved Hospital Problem List:    Assessment & Plan:  Chronic respiratory failure Trach/vent dependence  -Cont MV support, routine trach care. He has been in the same setting for 6 months and bedridden. He will not have meaningful life. Will try weaning after diuresis. Wife agreed to DNR, not comfort care. Palliative care consult  -VAP pulm hygiene and with lung protective strategies -07/11/2023 Echo: EF 50%, ?diastolic dysfunction.  -Duoneb -Head of bed elevated 30 degrees. -Plateau pressures less than 30 cm H20.  -Follow intermittent chest x-ray and ABG.   -SAT/SBT as tolerated, mentation  preclude extubation  -Ensure adequate pulmonary hygiene  -PAD protocol   HyperNa: resolved and Na level is stable -same free water  flushes   AKI/?CKD: Cr is stable -IV Lasix  x2 -Am labs -Foley exchanged in ED.  -Renal U/S: negative    SDH (s/p mini crani/evac 12/2022 at [facility] c/b R cerebellar hemorrhage and seizures) and metabolic encephalopathy, and Dementia -delirium precautions  -minimize CNS  depressing meds  -Ammonia level: NL -Seroquel PRN    DM-2: HbA1c (7.9%) -Glycemic control -Lispro and Lantus . No more hypoglycemic episodes      HTN, CAD, s/p CABG, S-CHF   Afib (off NOAC) 07/11/2023 Echo: EF 50%, possible diastolic dysfunction. -Monitor BP and HR   GERD -PPI   Severe PCM -Dietitian consult   Acute anemia on chronic anemia of chronic illness: abd wall hematomas  -SCD -Blood Tx for Hb>7, 1 unit PRBC Tx. All anemia w/u indicates slow MSK hematomas, GI loss or renal loss.     Severe sepsis, CRE Klebsiella UTI and bacteremia: Foley exchanged on arrival -Continue Avycaz  for 2 weeks, last dose 5/16 -Repeat Bcx2 07/13/2023: negative -Repeat Bcx 07/18/2023 per ID: pending  -Seen by ID -PICC line today   Best Practice (right click and "Reselect all SmartList Selections" daily)  Diet/type: tube feeds DVT prophylaxis LMWH Pressure ulcer(s): N/A GI prophylaxis: PPI Lines: midline, will order PICC line due to issues with blood draw from the midline.  Foley: Yes, and it is still needed Code Status: DNR Last date of multidisciplinary goals of care discussion []   Signature:  CRITICAL CARE Madelynn Schilder, MD [place] [facility] Office: [phone]  Total critical care time: 37 minutes

## 2023-07-20 NOTE — Progress Notes (Signed)
 Peripherally Inserted Central Catheter Placement  The IV Nurse has discussed with the patient and/or persons authorized to consent for the patient, the purpose of this procedure and the potential benefits and risks involved with this procedure.  The benefits include less needle sticks, lab draws from the catheter, and the patient may be discharged home with the catheter. Risks include, but not limited to, infection, bleeding, blood clot (thrombus formation), and puncture of an artery; nerve damage and irregular heartbeat and possibility to perform a PICC exchange if needed/ordered by physician.  Alternatives to this procedure were also discussed.  Bard Power PICC patient education guide, fact sheet on infection prevention and patient information card has been provided to patient /or left at bedside.    PICC Placement Documentation  PICC Single Lumen 07/20/23 Right Basilic 43 cm 0 cm (Active)  Indication for Insertion or Continuance of Line Home intravenous therapies (PICC only) 07/20/23 1236  Exposed Catheter (cm) 0 cm 07/20/23 1236  Site Assessment Clean, Dry, Intact 07/20/23 1236  Line Status Flushed;Saline locked;Blood return noted 07/20/23 1236  Dressing Type Transparent;Securing device 07/20/23 1236  Dressing Status Antimicrobial disc/dressing in place;Clean, Dry, Intact 07/20/23 1236  Line Care Connections checked and tightened 07/20/23 1236  Line Adjustment (NICU/IV Team Only) No 07/20/23 1236  Dressing Intervention New dressing 07/20/23 1236  Dressing Change Due 07/27/23 07/20/23 1236       Albert Vance 07/20/2023, 12:36 PM

## 2023-07-20 NOTE — Progress Notes (Signed)
 Daily Progress Note   Patient Name: Albert Vance.       Date: 07/20/2023 DOB: 07-11-1945  Age: 78 y.o. MRN#: [id number] Attending Physician: Arlyne Bering, MD Primary Care Physician: Jerral Moos, MD Admit Date: 07/10/2023  Reason for Consultation/Follow-up: Establishing goals of care   Length of Stay: 10  Current Medications: Scheduled Meds:   amantadine   100 mg Per Tube Daily   amLODipine   10 mg Per Tube Daily   brimonidine   1 drop Both Eyes Q8H   Chlorhexidine  Gluconate Cloth  6 each Topical Daily   enoxaparin  (LOVENOX ) injection  40 mg Subcutaneous Daily   feeding supplement (PROSource TF20)  60 mL Per Tube BID   free water   250 mL Per Tube Q4H   insulin  aspart  0-15 Units Subcutaneous Q4H   insulin  glargine-yfgn  10 Units Subcutaneous Daily   ipratropium-albuterol   3 mL Nebulization BID   modafinil   100 mg Per Tube Daily   nutrition supplement (JUVEN)  1 packet Per Tube BID BM   mouth rinse  15 mL Mouth Rinse Q2H   pantoprazole  (PROTONIX ) IV  40 mg Intravenous Daily   polyvinyl alcohol   1 drop Both Eyes Q4H    Continuous Infusions:  ceftazidime -avibactam (AVYCAZ ) 1.25 g in dextrose  5 % 50 mL IVPB 25 mL/hr at 07/20/23 0700   feeding supplement (OSMOLITE 1.5 CAL) 45 mL/hr at 07/20/23 0700    PRN Meds: acetaminophen , clonazePAM , ipratropium-albuterol , ondansetron  (ZOFRAN ) IV, oxyCODONE , QUEtiapine  Physical Exam Vitals reviewed.  Constitutional:      General: He is not in acute distress.    Appearance: He is ill-appearing.     Comments: Generalized edema  Cardiovascular:     Rate and Rhythm: Normal rate.  Pulmonary:     Comments: ventillator            Vital Signs: BP (!) 147/70   Pulse 86   Temp 99.8 F (37.7 C) (Oral)   Resp (!) 24    Ht 5\' 6"  (1.676 m)   Wt 79 kg   SpO2 92%   BMI 28.11 kg/m  SpO2: SpO2: 92 % O2 Device: O2 Device: Ventilator O2 Flow Rate:         Palliative Assessment/Data: 30%      Patient Active Problem List   Diagnosis Date  Noted   Goals of care, counseling/discussion 07/15/2023   Carbapenem-resistant bacterial infection 07/14/2023   Hypoalbuminemia 07/11/2023   Klebsiella sepsis (HCC) 07/11/2023   Hypernatremia 07/10/2023   AKI (acute kidney injury) (HCC) 07/10/2023   Ventilator dependent (HCC) 07/10/2023   Symptomatic anemia 07/10/2023   Anasarca 07/10/2023   Pressure injury of skin of left heel 07/10/2023    Palliative Care Assessment & Plan   Patient Profile: 78 y.o. 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 Found to be septic due to CRE klebsiella UTI and bacteremia with AKI.    Today's Discussion: Received updates from nursing. No significant changes. Patient has been requiring PRNs for comfort. Patient has been awake at night and sleeping during days. He is sleeping now in NAD. No family at bedside.  Called patient's wife Albert Vance. She shared she did not come to the hospital Saturday. She was able to spend some time with her children. She plans to come back to hospital today. She has no concerns or PMT needs at this time. I shared that my colleague Adriana Hopping will be here this week and will follow up.  Encouraged patient's wife to call PMT with questions or concerns.   Recommendations/Plan: DNR Full scope of care: Patient's wife open to all interventions PMT will continue to follow   Code Status:    Code Status Orders  (From admission, onward)           Start     Ordered   07/15/23 1405  Do not attempt resuscitation (DNR) Pre-Arrest Interventions Desired  (Code  Status)  Continuous       Question Answer Comment  If pulseless and not breathing No CPR or chest compressions.   In Pre-Arrest Conditions (Patient Has Pulse and Is Breathing) May intubate, use advanced airway interventions and cardioversion/ACLS medications if appropriate or indicated. May transfer to ICU.   Consent: Discussion documented in EHR or advanced directives reviewed      07/15/23 1406         Extensive chart review has been completed prior to seeing the patient including labs, vital signs, imaging, progress/consult notes, orders, medications, and available advance directive documents.  Care plan was discussed with bedside RN   Time spent: 25 minutes  Thank you for allowing the Palliative Medicine Team to assist in the care of this patient.     Daina Drum, NP  Please contact Palliative Medicine Team phone at [phone] for questions and concerns.

## 2023-07-20 NOTE — Plan of Care (Signed)
  Problem: Activity: Goal: Ability to tolerate increased activity will improve Outcome: Progressing   Problem: Respiratory: Goal: Ability to maintain a clear airway and adequate ventilation will improve Outcome: Progressing   Problem: Role Relationship: Goal: Method of communication will improve Outcome: Progressing   Problem: Education: Goal: Knowledge of General Education information will improve Description: Including pain rating scale, medication(s)/side effects and non-pharmacologic comfort measures Outcome: Progressing   Problem: Clinical Measurements: Goal: Will remain free from infection Outcome: Progressing Goal: Diagnostic test results will improve Outcome: Progressing Goal: Respiratory complications will improve Outcome: Progressing Goal: Cardiovascular complication will be avoided Outcome: Progressing   Problem: Activity: Goal: Risk for activity intolerance will decrease Outcome: Progressing   Problem: Nutrition: Goal: Adequate nutrition will be maintained Outcome: Progressing   Problem: Coping: Goal: Level of anxiety will decrease Outcome: Progressing   Problem: Elimination: Goal: Will not experience complications related to bowel motility Outcome: Progressing Goal: Will not experience complications related to urinary retention Outcome: Progressing   Problem: Pain Managment: Goal: General experience of comfort will improve and/or be controlled Outcome: Progressing   Problem: Safety: Goal: Ability to remain free from injury will improve Outcome: Progressing   Problem: Skin Integrity: Goal: Risk for impaired skin integrity will decrease Outcome: Progressing

## 2023-07-20 NOTE — Progress Notes (Signed)
 Met the wife and updated her about his condition

## 2023-07-21 ENCOUNTER — Encounter (HOSPITAL_COMMUNITY): Payer: Self-pay | Admitting: Infectious Diseases

## 2023-07-21 DIAGNOSIS — E870 Hyperosmolality and hypernatremia: Secondary | ICD-10-CM | POA: Diagnosis not present

## 2023-07-21 DIAGNOSIS — R6520 Severe sepsis without septic shock: Secondary | ICD-10-CM | POA: Diagnosis not present

## 2023-07-21 DIAGNOSIS — A419 Sepsis, unspecified organism: Secondary | ICD-10-CM | POA: Diagnosis not present

## 2023-07-21 DIAGNOSIS — J9610 Chronic respiratory failure, unspecified whether with hypoxia or hypercapnia: Secondary | ICD-10-CM | POA: Diagnosis not present

## 2023-07-21 LAB — BASIC METABOLIC PANEL WITH GFR
Anion gap: 6 (ref 5–15)
BUN: 102 mg/dL — ABNORMAL HIGH (ref 8–23)
CO2: 27 mmol/L (ref 22–32)
Calcium: 8.8 mg/dL — ABNORMAL LOW (ref 8.9–10.3)
Chloride: 114 mmol/L — ABNORMAL HIGH (ref 98–111)
Creatinine, Ser: 1.49 mg/dL — ABNORMAL HIGH (ref 0.61–1.24)
GFR, Estimated: 47 mL/min — ABNORMAL LOW (ref >60)
Glucose, Bld: 143 mg/dL — ABNORMAL HIGH (ref 70–99)
Potassium: 4.5 mmol/L (ref 3.5–5.1)
Sodium: 147 mmol/L — ABNORMAL HIGH (ref 135–145)

## 2023-07-21 LAB — CBC
HCT: 25.9 % — ABNORMAL LOW (ref 39.0–52.0)
Hemoglobin: 7.6 g/dL — ABNORMAL LOW (ref 13.0–17.0)
MCH: 29.7 pg (ref 26.0–34.0)
MCHC: 29.3 g/dL — ABNORMAL LOW (ref 30.0–36.0)
MCV: 101.2 fL — ABNORMAL HIGH (ref 80.0–100.0)
Platelets: 164 10*3/uL (ref 150–400)
RBC: 2.56 MIL/uL — ABNORMAL LOW (ref 4.22–5.81)
RDW: 19.3 % — ABNORMAL HIGH (ref 11.5–15.5)
WBC: 7.4 10*3/uL (ref 4.0–10.5)
nRBC: 0.3 % — ABNORMAL HIGH (ref 0.0–0.2)

## 2023-07-21 LAB — MAGNESIUM: Magnesium: 2.6 mg/dL — ABNORMAL HIGH (ref 1.7–2.4)

## 2023-07-21 LAB — PHOSPHORUS: Phosphorus: 2.9 mg/dL (ref 2.5–4.6)

## 2023-07-21 LAB — GLUCOSE, CAPILLARY
Glucose-Capillary: 103 mg/dL — ABNORMAL HIGH (ref 70–99)
Glucose-Capillary: 156 mg/dL — ABNORMAL HIGH (ref 70–99)
Glucose-Capillary: 204 mg/dL — ABNORMAL HIGH (ref 70–99)
Glucose-Capillary: 206 mg/dL — ABNORMAL HIGH (ref 70–99)
Glucose-Capillary: 216 mg/dL — ABNORMAL HIGH (ref 70–99)
Glucose-Capillary: 216 mg/dL — ABNORMAL HIGH (ref 70–99)

## 2023-07-21 MED ORDER — FUROSEMIDE 10 MG/ML IJ SOLN
40.0000 mg | Freq: Once | INTRAMUSCULAR | Status: AC
  Administered 2023-07-21: 40 mg via INTRAVENOUS
  Filled 2023-07-21: qty 4

## 2023-07-21 MED ORDER — FREE WATER
300.0000 mL | Status: DC
  Administered 2023-07-21 – 2023-07-24 (×19): 300 mL

## 2023-07-21 NOTE — Progress Notes (Signed)
 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 Dementia, Afib (off NOAC), PCM, and anemia of chronic illness. He has been a resident of [facility] since 01/2023 when he was dc from [facility] for SDH/sequelae of. He presented to [facility] ED 5/1 from [facility] for further evaluation of abnormal labs -- noted to have elevated Cr and low Hgb. Foley exchanged. Given one unit of PRBC and 1 L NS 0.9% bolus.   On stable vent settings (PRVC 20/510/+5/40%) and without trach complications.  PCCM consulted for evaluation in this setting.  Pertinent Medical History:  SDH, seizures (weaned off Keppra), multiple aspiration PNA, failed extubation, chronic resp failure requiring trach/PEG, PTSD, DM-2, HTN, CAD, GERD, ? Dementia, Afib (off NOAC), PCM, and anemia of chronic illness  Significant Hospital Events: Including procedures, antibiotic start and stop dates in addition to other pertinent events   5/1 1U PRBC for hgb 6.6. PCCM consult Foley exchanged in the ED here. 5/2 Cultures positive for klebsiella with CRE. 5/8 Remains on PRVC, Wife met with palliative, DNR but continue current interventions  5/9 Remains on vent via trach, hgb dropped to 6.8 one unit PRBC ordered  5/10: restless. On TF, water  flushes 250 cc q 4 hrs, Na 144. MV: PRVC 20/510/+5/40%. Failed PSV 07/15/2023, Positive 1.5 L fluid balance in 7 days 5/11: less restless. MV: PRVC 20/510/+5/40%. Failed PSV 07/15/2023, Positive 3.3 L fluid balance in 8 days 5/12: less restless. MV: PRVC 20/510/+5/40%. Failed PSV 07/15/2023 and today due to tachypnea, Positive 3.5 L  fluid balance in 9 days. On TF  Interim History / Subjective:  No family at bedside Unable to follow commands    Objective:  Blood pressure (!) 148/122, pulse 79, temperature 98.4 F (36.9 C), temperature source Axillary, resp. rate (!) 28, height 5\' 6"  (1.676 m), weight 79.2 kg, SpO2 97%.    Vent Mode: PRVC FiO2 (%):  [40 %] 40 % Set Rate:  [20 bmp] 20 bmp Vt Set:  [510 mL] 510 mL PEEP:  [5 cmH20] 5 cmH20 Plateau Pressure:  [23 cmH20-28 cmH20] 23 cmH20   Intake/Output Summary (Last 24 hours) at 07/21/2023 0924 Last data filed at 07/21/2023 0700 Gross per 24 hour  Intake 1972.92 ml  Output 2050 ml  Net -77.08 ml   Filed Weights   07/19/23 0200 07/20/23 0452 07/21/23 0429  Weight: 78.1 kg 79 kg 79.2 kg   Physical Examination: Afebrile. V/S are stable  PERL Trach Shiley #6 cuffed Lungs: Less crackles. No wheezing. Symmetrical air entry Heart: NL S1/S2, no m/g/r Abd: no distension or tenderness. PEG tube LL: +1 edema, symmetrical  CNS: Opens eyes to verbal stimulation. Tracking. Does not follow commands. Some spontaneous non-purposeful upper extremity movements  Resolved Hospital Problem List:    Assessment & Plan:  Chronic respiratory failure Trach/vent dependence  -Cont MV support, routine trach care. He has been in the same setting for 6 months and bedridden. He will not have meaningful life. Will try weaning after diuresis. Wife agreed to DNR, not comfort care. Palliative care consult  -VAP pulm hygiene and with lung protective strategies -07/11/2023 Echo:  EF 50%, ?diastolic dysfunction.  -Duoneb -Head of bed elevated 30 degrees. -Plateau pressures less than 30 cm H20.  -Follow intermittent chest x-ray and ABG.   -SAT/SBT as tolerated, mentation preclude extubation  -Ensure adequate pulmonary hygiene  -PAD protocol   HyperNa: increasing slowly  -Increase free water  flushes to 300 cc q 4 hrs   AKI/?CKD: Cr is stable -Am labs -Foley exchanged in ED.  -Renal  U/S: negative    SDH (s/p mini crani/evac 12/2022 at [facility] c/b R cerebellar hemorrhage and seizures) and metabolic encephalopathy, and Dementia -delirium precautions  -minimize CNS depressing meds  -Ammonia level: NL -Seroquel PRN    DM-2: HbA1c (7.9%) -Glycemic control -Lispro and Lantus . No more hypoglycemic episodes      HTN, CAD, s/p CABG, S-CHF   Afib (off NOAC) 07/11/2023 Echo: EF 50%, possible diastolic dysfunction. -Monitor BP and HR   GERD -PPI   Severe PCM -Dietitian consult   Acute anemia on chronic anemia of chronic illness: abd wall hematomas  -SCD -Blood Tx for Hb>7, s/p 1 unit PRBC Tx. All anemia w/u indicates slow MSK hematomas, GI loss or renal loss.     Severe sepsis, CRE Klebsiella UTI and bacteremia: Foley exchanged on arrival -Continue Avycaz  for 2 weeks, last dose 5/16 -Repeat Bcx2 07/13/2023: negative -Repeat Bcx 07/18/2023 per ID: pending  -Seen by ID -PICC line 07/20/2023   Best Practice (right click and "Reselect all SmartList Selections" daily)  Diet/type: tube feeds DVT prophylaxis LMWH Pressure ulcer(s): N/A GI prophylaxis: PPI Lines: midline, will order PICC line due to issues with blood draw from the midline.  Foley: Yes, and it is still needed Code Status: DNR Last date of multidisciplinary goals of care discussion []   Signature:  CRITICAL CARE Madelynn Schilder, MD [employer] Pul[facility] Critical [facility] Office: [phone]  Total critical care time: 37 minutes

## 2023-07-21 NOTE — Progress Notes (Signed)
 Met the wife and updated her about his condition including Na and Hb levels, placement, edema, and PNA.

## 2023-07-21 NOTE — Plan of Care (Signed)
  Problem: Activity: Goal: Ability to tolerate increased activity will improve Outcome: Progressing   Problem: Respiratory: Goal: Ability to maintain a clear airway and adequate ventilation will improve Outcome: Progressing   Problem: Role Relationship: Goal: Method of communication will improve Outcome: Progressing   Problem: Education: Goal: Knowledge of General Education information will improve Description: Including pain rating scale, medication(s)/side effects and non-pharmacologic comfort measures Outcome: Progressing   Problem: Clinical Measurements: Goal: Will remain free from infection Outcome: Progressing Goal: Diagnostic test results will improve Outcome: Progressing Goal: Respiratory complications will improve Outcome: Progressing Goal: Cardiovascular complication will be avoided Outcome: Progressing   Problem: Activity: Goal: Risk for activity intolerance will decrease Outcome: Progressing   Problem: Nutrition: Goal: Adequate nutrition will be maintained Outcome: Progressing   Problem: Elimination: Goal: Will not experience complications related to bowel motility Outcome: Progressing Goal: Will not experience complications related to urinary retention Outcome: Progressing

## 2023-07-21 NOTE — Plan of Care (Signed)
  Problem: Activity: Goal: Ability to tolerate increased activity will improve Outcome: Progressing   Problem: Respiratory: Goal: Ability to maintain a clear airway and adequate ventilation will improve Outcome: Progressing   Problem: Role Relationship: Goal: Method of communication will improve Outcome: Progressing   Problem: Education: Goal: Knowledge of General Education information will improve Description: Including pain rating scale, medication(s)/side effects and non-pharmacologic comfort measures Outcome: Progressing   Problem: Clinical Measurements: Goal: Will remain free from infection Outcome: Progressing Goal: Diagnostic test results will improve Outcome: Progressing Goal: Respiratory complications will improve Outcome: Progressing Goal: Cardiovascular complication will be avoided Outcome: Progressing   Problem: Activity: Goal: Risk for activity intolerance will decrease Outcome: Progressing   Problem: Nutrition: Goal: Adequate nutrition will be maintained Outcome: Progressing   Problem: Coping: Goal: Level of anxiety will decrease Outcome: Progressing   Problem: Elimination: Goal: Will not experience complications related to bowel motility Outcome: Progressing Goal: Will not experience complications related to urinary retention Outcome: Progressing   Problem: Pain Managment: Goal: General experience of comfort will improve and/or be controlled Outcome: Progressing   Problem: Safety: Goal: Ability to remain free from injury will improve Outcome: Progressing   Problem: Skin Integrity: Goal: Risk for impaired skin integrity will decrease Outcome: Progressing

## 2023-07-22 DIAGNOSIS — E870 Hyperosmolality and hypernatremia: Secondary | ICD-10-CM | POA: Diagnosis not present

## 2023-07-22 DIAGNOSIS — A419 Sepsis, unspecified organism: Secondary | ICD-10-CM | POA: Diagnosis not present

## 2023-07-22 DIAGNOSIS — J9610 Chronic respiratory failure, unspecified whether with hypoxia or hypercapnia: Secondary | ICD-10-CM | POA: Diagnosis not present

## 2023-07-22 DIAGNOSIS — R6520 Severe sepsis without septic shock: Secondary | ICD-10-CM | POA: Diagnosis not present

## 2023-07-22 LAB — GLUCOSE, CAPILLARY
Glucose-Capillary: 229 mg/dL — ABNORMAL HIGH (ref 70–99)
Glucose-Capillary: 148 mg/dL — ABNORMAL HIGH (ref 70–99)
Glucose-Capillary: 169 mg/dL — ABNORMAL HIGH (ref 70–99)
Glucose-Capillary: 178 mg/dL — ABNORMAL HIGH (ref 70–99)
Glucose-Capillary: 174 mg/dL — ABNORMAL HIGH (ref 70–99)
Glucose-Capillary: 144 mg/dL — ABNORMAL HIGH (ref 70–99)

## 2023-07-22 LAB — CULTURE, BLOOD (ROUTINE X 2)
Culture: NO GROWTH
Special Requests: ADEQUATE
Special Requests: ADEQUATE

## 2023-07-22 LAB — BASIC METABOLIC PANEL WITH GFR
Anion gap: 7 (ref 5–15)
BUN: 109 mg/dL — ABNORMAL HIGH (ref 8–23)
CO2: 26 mmol/L (ref 22–32)
Calcium: 8.7 mg/dL — ABNORMAL LOW (ref 8.9–10.3)
Chloride: 112 mmol/L — ABNORMAL HIGH (ref 98–111)
Creatinine, Ser: 1.5 mg/dL — ABNORMAL HIGH (ref 0.61–1.24)
GFR, Estimated: 47 mL/min — ABNORMAL LOW
Glucose, Bld: 157 mg/dL — ABNORMAL HIGH (ref 70–99)
Potassium: 4.1 mmol/L (ref 3.5–5.1)
Sodium: 145 mmol/L (ref 135–145)

## 2023-07-22 LAB — CBC WITH DIFFERENTIAL/PLATELET
Abs Immature Granulocytes: 0.03 10*3/uL (ref 0.00–0.07)
Basophils Absolute: 0.1 10*3/uL (ref 0.0–0.1)
Basophils Relative: 1 %
Eosinophils Absolute: 0.4 10*3/uL (ref 0.0–0.5)
Eosinophils Relative: 6 %
HCT: 24.2 % — ABNORMAL LOW (ref 39.0–52.0)
Hemoglobin: 7.2 g/dL — ABNORMAL LOW (ref 13.0–17.0)
Immature Granulocytes: 0 %
Lymphocytes Relative: 5 %
Lymphs Abs: 0.4 10*3/uL — ABNORMAL LOW (ref 0.7–4.0)
MCH: 30.3 pg (ref 26.0–34.0)
MCHC: 29.8 g/dL — ABNORMAL LOW (ref 30.0–36.0)
MCV: 101.7 fL — ABNORMAL HIGH (ref 80.0–100.0)
Monocytes Absolute: 0.5 10*3/uL (ref 0.1–1.0)
Monocytes Relative: 6 %
Neutro Abs: 6.3 10*3/uL (ref 1.7–7.7)
Neutrophils Relative %: 82 %
Platelets: 155 10*3/uL (ref 150–400)
RBC: 2.38 MIL/uL — ABNORMAL LOW (ref 4.22–5.81)
RDW: 19 % — ABNORMAL HIGH (ref 11.5–15.5)
WBC: 7.6 10*3/uL (ref 4.0–10.5)
nRBC: 0 % (ref 0.0–0.2)

## 2023-07-22 MED ORDER — INSULIN GLARGINE-YFGN 100 UNIT/ML ~~LOC~~ SOLN
12.0000 [IU] | Freq: Every day | SUBCUTANEOUS | Status: DC
  Administered 2023-07-22 – 2023-07-30 (×9): 12 [IU] via SUBCUTANEOUS
  Filled 2023-07-22 (×9): qty 0.12

## 2023-07-22 NOTE — Progress Notes (Signed)
 NAME:  Albert Corzo., MRN:  [id number], DOB:  1945-12-13, LOS: 12 ADMISSION DATE:  07/10/2023, CONSULTATION DATE:  07/10/2023 REFERRING MD:  Scarlette Currier - EDP, CHIEF COMPLAINT: Abnormal labs   History of Present Illness:  A 78 yr old male patient with hx of SDH (s/p mini crani/evac 12/2022 at [facility] c/b R cerebellar hemorrhage and seizures), multiple aspiration PNA, failed extubation, chronic resp failure requiring trach/PEG, PTSD, DM-2, HTN, CAD, GERD, ? Dementia, Afib (off NOAC), PCM, and anemia of chronic illness. He has been a resident of [facility] since 01/2023 when he was dc from [facility] for SDH/sequelae of. He presented to [facility] ED 5/1 from [facility] for further evaluation of abnormal labs -- noted to have elevated Cr and low Hgb. Foley exchanged. Given one unit of PRBC and 1 L NS 0.9% bolus.   On stable vent settings (PRVC 20/510/+5/40%) and without trach complications.  PCCM consulted for evaluation in this setting.  Pertinent Medical History:  SDH, seizures (weaned off Keppra), multiple aspiration PNA, failed extubation, chronic resp failure requiring trach/PEG, PTSD, DM-2, HTN, CAD, GERD, ? Dementia, Afib (off NOAC), PCM, and anemia of chronic illness  Significant Hospital Events: Including procedures, antibiotic start and stop dates in addition to other pertinent events   5/1 1U PRBC for hgb 6.6. PCCM consult Foley exchanged in the ED here. 5/2 Cultures positive for klebsiella with CRE. 5/8 Remains on PRVC, Wife met with palliative, DNR but continue current interventions  5/9 Remains on vent via trach, hgb dropped to 6.8 one unit PRBC ordered  5/10: restless. On TF, water  flushes 250 cc q 4 hrs, Na 144. MV: PRVC 20/510/+5/40%. Failed PSV 07/15/2023, Positive 1.5 L fluid balance in 7 days 5/11: less restless. MV: PRVC 20/510/+5/40%. Failed PSV 07/15/2023, Positive 3.3 L fluid balance in 8 days 5/12: less restless. MV: PRVC 20/510/+5/40%. Failed PSV 07/15/2023 and today due to tachypnea, Positive 3.5 L  fluid balance in 9 days. On TF 5/13: comfortable today. MV: PRVC 20/510/+5/40%. Failed PSV 07/15/2023 and 07/21/2023 due to tachypnea, Positive 3.3 L fluid balance in 10 days. On TF  Interim History / Subjective:  No family at bedside Unable to follow commands    Objective:  Blood pressure (!) 141/49, pulse 81, temperature 99.9 F (37.7 C), temperature source Axillary, resp. rate (!) 33, height 5\' 6"  (1.676 m), weight 77.5 kg, SpO2 96%.    Vent Mode: PRVC FiO2 (%):  [40 %] 40 % Set Rate:  [15 bmp] 15 bmp Vt Set:  [510 mL] 510 mL PEEP:  [5 cmH20] 5 cmH20 Plateau Pressure:  [24 cmH20-34 cmH20] 24 cmH20   Intake/Output Summary (Last 24 hours) at 07/22/2023 1133 Last data filed at 07/22/2023 0700 Gross per 24 hour  Intake 1957.46 ml  Output 2425 ml  Net -467.54 ml   Filed Weights   07/20/23 0452 07/21/23 0429 07/22/23 0500  Weight: 79 kg 79.2 kg 77.5 kg   Physical Examination: Afebrile. V/S are stable  PERL Trach Shiley #6 cuffed Lungs: Less crackles. No wheezing. Symmetrical air entry Heart: NL S1/S2, no m/g/r Abd: no distension or tenderness. PEG tube LL: +1 edema, symmetrical  CNS: Opens eyes to verbal stimulation. Tracking. Does not follow commands. Some spontaneous non-purposeful upper extremity movements  Resolved Hospital Problem List:    Assessment & Plan:  Chronic respiratory failure Trach/vent dependence  -Cont MV support, routine trach care. He has been in the same setting for 6 months and bedridden. He will not have meaningful life. Will try  weaning after diuresis. Wife agreed to DNR, not comfort care. Palliative care consult  -VAP pulm hygiene and with lung protective strategies -07/11/2023 Echo: EF 50%, ?diastolic dysfunction.  -Duoneb -Head of bed elevated 30 degrees. -Plateau pressures less than 30 cm H20.  -Follow intermittent chest x-ray and ABG.   -SAT/SBT as tolerated, mentation preclude extubation  -Ensure adequate pulmonary hygiene  -PAD protocol    HyperNa: better  -On free water  flushes to 300 cc q 4 hrs   AKI/?CKD: Cr is stable -Am labs PRN -Foley exchanged in ED.  -Renal U/S: negative    SDH (s/p mini crani/evac 12/2022 at [facility] c/b R cerebellar hemorrhage and seizures) and metabolic encephalopathy, and Dementia -delirium precautions  -minimize CNS depressing meds  -Ammonia level: NL -Seroquel PRN    DM-2: HbA1c (7.9%) -Glycemic control -Lispro and Lantus . No more hypoglycemic episodes      HTN, CAD, s/p CABG, S-CHF   Afib (off NOAC) 07/11/2023 Echo: EF 50%, possible diastolic dysfunction. -Monitor BP and HR   GERD -PPI   Severe PCM -Dietitian consult   Acute anemia on chronic anemia of chronic illness: abd wall hematomas  -SCD -Blood Tx for Hb>7, s/p 1 unit PRBC Tx. All anemia w/u indicates slow MSK hematomas, GI loss or renal loss.     Severe sepsis, CRE Klebsiella UTI and bacteremia: Foley exchanged on arrival -Continue Avycaz  for 2 weeks, last dose 5/16 -Repeat Bcx2 07/13/2023: negative -Repeat Bcx 07/18/2023 per ID: pending  -Seen by ID -PICC line 07/20/2023   Best Practice (right click and "Reselect all SmartList Selections" daily)  Diet/type: tube feeds DVT prophylaxis LMWH Pressure ulcer(s): N/A GI prophylaxis: PPI Lines: midline, will order PICC line due to issues with blood draw from the midline.  Foley: Yes, and it is still needed Code Status: DNR Last date of multidisciplinary goals of care discussion []   Signature:  CRITICAL CARE Madelynn Schilder, MD [place] Pulmonary & Critical [facility] Office: [phone]  Total critical care time: 36 minutes

## 2023-07-22 NOTE — Plan of Care (Signed)
  Problem: Activity: Goal: Ability to tolerate increased activity will improve Outcome: Progressing   Problem: Respiratory: Goal: Ability to maintain a clear airway and adequate ventilation will improve Outcome: Progressing   Problem: Role Relationship: Goal: Method of communication will improve Outcome: Progressing   Problem: Education: Goal: Knowledge of General Education information will improve Description: Including pain rating scale, medication(s)/side effects and non-pharmacologic comfort measures Outcome: Progressing   Problem: Clinical Measurements: Goal: Will remain free from infection Outcome: Progressing Goal: Diagnostic test results will improve Outcome: Progressing Goal: Respiratory complications will improve Outcome: Progressing Goal: Cardiovascular complication will be avoided Outcome: Progressing   Problem: Nutrition: Goal: Adequate nutrition will be maintained Outcome: Progressing   Problem: Activity: Goal: Risk for activity intolerance will decrease Outcome: Progressing   Problem: Coping: Goal: Level of anxiety will decrease Outcome: Progressing   Problem: Elimination: Goal: Will not experience complications related to bowel motility Outcome: Progressing Goal: Will not experience complications related to urinary retention Outcome: Progressing   Problem: Pain Managment: Goal: General experience of comfort will improve and/or be controlled Outcome: Progressing   Problem: Safety: Goal: Ability to remain free from injury will improve Outcome: Progressing

## 2023-07-22 NOTE — Progress Notes (Signed)
 Nutrition Follow-up  DOCUMENTATION CODES:  Not applicable  INTERVENTION:  TF via PEG: Osmolite 1.5 at 63ml/hr ( per day) 60ml ProSource TF20 BID Provides 1780 kcal, 108g protein and free water  daily   Free water  flushes 300ml q4h per MD ( per day) Total free water  (TF+FWF) - 2323 ml per day   Continue Juven BID to support wound healing  NUTRITION DIAGNOSIS:  Increased nutrient needs related to acute illness, wound healing as evidenced by estimated needs. - remains applicable  GOAL:  Patient will meet greater than or equal to 90% of their needs - goal met via TF  MONITOR:  Vent status, Labs, Weight trends, I & O's, Skin, TF tolerance  REASON FOR ASSESSMENT:  Ventilator, Consult Enteral/tube feeding initiation and management, Wound healing  ASSESSMENT:  Pt admitted from [facility] with abnormal labs (elevated Cr and low hgb). PMH significant for SDH ( s/p mini crani/evac 12/2022 at [facility] c/b cerebellar hemorrhage and seizures), multiple aspiration PNA, chronic respiratory failure requiring trach/PEG, DM2, HTN, CAD, GERD, afib, anemia of chronic illness.  Patient remains on ventilator support via trach.  MV: 13.6 L/min Temp (24hrs), Avg:98.3 F (36.8 C), Min:97.3 F (36.3 C), Max:99.9 F (37.7 C)  Pt continues to fail PSV d/t tachypnea.  Continues on abx treatment.   Pt intermittently continues with hypernatremia. Improved with increase in free water  flushes.   Checked in with pt at bedside. Wife not present at that time.  Pt back to goal TF rate. Tolerating well.   Admit weight: 79.4 kg Current weight: 77.5 kg + deep pitting edema BUE/BLE/perineal/sacral  Drain/lines:  UOP: x24 hours Single lumen PICC PEG  Medications: SSI 0-15 units q4h, semglee  12 units daily  Labs:  BUN 109 Cr 1.50 GFR 47 CBG's 148-229 x 24 hours   Diet Order:   Diet Order             Diet NPO time specified  Diet effective now                    EDUCATION NEEDS:   Education needs have been addressed  Skin:  Skin Assessment: Skin Integrity Issues: Skin Integrity Issues:: Stage II, Unstageable, Stage III Stage II: bilateral buttocks Stage III: L ankle Unstageable: L heel  Last BM:  5/12 type 7 medium  Height:   Ht Readings from Last 1 Encounters:  07/17/23 5\' 6"  (1.676 m)    Weight:   Wt Readings from Last 1 Encounters:  07/22/23 77.5 kg    Ideal Body Weight:  64.5 kg  BMI:  Body mass index is 27.58 kg/m.  Estimated Nutritional Needs:   Kcal:  1700-1900  Protein:  100-115g  Fluid:  >/=1.7L  Rocklin Chute, RDN, LDN Clinical Nutrition See AMiON for contact information.

## 2023-07-23 DIAGNOSIS — E870 Hyperosmolality and hypernatremia: Secondary | ICD-10-CM | POA: Diagnosis not present

## 2023-07-23 DIAGNOSIS — G9341 Metabolic encephalopathy: Secondary | ICD-10-CM | POA: Diagnosis not present

## 2023-07-23 DIAGNOSIS — J9611 Chronic respiratory failure with hypoxia: Secondary | ICD-10-CM | POA: Diagnosis not present

## 2023-07-23 DIAGNOSIS — D638 Anemia in other chronic diseases classified elsewhere: Secondary | ICD-10-CM

## 2023-07-23 DIAGNOSIS — Z930 Tracheostomy status: Secondary | ICD-10-CM | POA: Diagnosis not present

## 2023-07-23 LAB — CBC WITH DIFFERENTIAL/PLATELET
Abs Immature Granulocytes: 0.03 10*3/uL (ref 0.00–0.07)
Basophils Absolute: 0.1 10*3/uL (ref 0.0–0.1)
Basophils Relative: 1 %
Eosinophils Absolute: 0.5 10*3/uL (ref 0.0–0.5)
Eosinophils Relative: 8 %
HCT: 23.1 % — ABNORMAL LOW (ref 39.0–52.0)
Hemoglobin: 6.8 g/dL — CL (ref 13.0–17.0)
Immature Granulocytes: 1 %
Lymphocytes Relative: 8 %
Lymphs Abs: 0.4 10*3/uL — ABNORMAL LOW (ref 0.7–4.0)
MCH: 30.2 pg (ref 26.0–34.0)
MCHC: 29.4 g/dL — ABNORMAL LOW (ref 30.0–36.0)
MCV: 102.7 fL — ABNORMAL HIGH (ref 80.0–100.0)
Monocytes Absolute: 0.4 10*3/uL (ref 0.1–1.0)
Monocytes Relative: 7 %
Neutro Abs: 4.4 10*3/uL (ref 1.7–7.7)
Neutrophils Relative %: 75 %
Platelets: 141 10*3/uL — ABNORMAL LOW (ref 150–400)
RBC: 2.25 MIL/uL — ABNORMAL LOW (ref 4.22–5.81)
RDW: 18.6 % — ABNORMAL HIGH (ref 11.5–15.5)
WBC: 5.9 10*3/uL (ref 4.0–10.5)
nRBC: 0 % (ref 0.0–0.2)

## 2023-07-23 LAB — HEMOGLOBIN AND HEMATOCRIT, BLOOD
HCT: 28.4 % — ABNORMAL LOW (ref 39.0–52.0)
Hemoglobin: 8.6 g/dL — ABNORMAL LOW (ref 13.0–17.0)

## 2023-07-23 LAB — BASIC METABOLIC PANEL WITH GFR
Anion gap: 5 (ref 5–15)
BUN: 110 mg/dL — ABNORMAL HIGH (ref 8–23)
CO2: 27 mmol/L (ref 22–32)
Calcium: 7.8 mg/dL — ABNORMAL LOW (ref 8.9–10.3)
Chloride: 110 mmol/L (ref 98–111)
Creatinine, Ser: 1.49 mg/dL — ABNORMAL HIGH (ref 0.61–1.24)
GFR, Estimated: 47 mL/min — ABNORMAL LOW (ref >60)
Glucose, Bld: 204 mg/dL — ABNORMAL HIGH (ref 70–99)
Potassium: 4.8 mmol/L (ref 3.5–5.1)
Sodium: 142 mmol/L (ref 135–145)

## 2023-07-23 LAB — GLUCOSE, CAPILLARY
Glucose-Capillary: 166 mg/dL — ABNORMAL HIGH (ref 70–99)
Glucose-Capillary: 201 mg/dL — ABNORMAL HIGH (ref 70–99)
Glucose-Capillary: 211 mg/dL — ABNORMAL HIGH (ref 70–99)
Glucose-Capillary: 162 mg/dL — ABNORMAL HIGH (ref 70–99)
Glucose-Capillary: 189 mg/dL — ABNORMAL HIGH (ref 70–99)
Glucose-Capillary: 263 mg/dL — ABNORMAL HIGH (ref 70–99)

## 2023-07-23 LAB — PREPARE RBC (CROSSMATCH)

## 2023-07-23 LAB — CULTURE, BLOOD (ROUTINE X 2)
Culture: NO GROWTH
Special Requests: ADEQUATE

## 2023-07-23 MED ORDER — SODIUM CHLORIDE 0.9% IV SOLUTION: Freq: Once | INTRAVENOUS | Status: AC

## 2023-07-23 MED ORDER — DOCUSATE SODIUM 100 MG PO CAPS: 100.0000 mg | ORAL_CAPSULE | Freq: Two times a day (BID) | ORAL | Status: DC | PRN

## 2023-07-23 MED ORDER — FAMOTIDINE 20 MG PO TABS: 20.0000 mg | ORAL_TABLET | Freq: Two times a day (BID) | ORAL | Status: DC

## 2023-07-23 MED ORDER — FAMOTIDINE 20 MG PO TABS
20.0000 mg | ORAL_TABLET | Freq: Every day | ORAL | Status: DC
  Administered 2023-07-23 – 2023-07-30 (×8): 20 mg
  Filled 2023-07-23 (×8): qty 1

## 2023-07-23 MED ORDER — DOCUSATE SODIUM 50 MG/5ML PO LIQD
100.0000 mg | Freq: Two times a day (BID) | ORAL | Status: DC | PRN
Start: 2023-07-23 — End: 2023-07-24
  Administered 2023-07-23: 100 mg via ORAL
  Filled 2023-07-23: qty 10

## 2023-07-23 MED ORDER — BISACODYL 5 MG PO TBEC: 5.0000 mg | DELAYED_RELEASE_TABLET | Freq: Every day | ORAL | Status: DC | PRN

## 2023-07-23 NOTE — Plan of Care (Signed)
  Problem: Respiratory: Goal: Ability to maintain a clear airway and adequate ventilation will improve Outcome: Progressing   Problem: Nutrition: Goal: Adequate nutrition will be maintained Outcome: Progressing   Problem: Elimination: Goal: Will not experience complications related to bowel motility Outcome: Progressing Goal: Will not experience complications related to urinary retention Outcome: Progressing   Problem: Pain Managment: Goal: General experience of comfort will improve and/or be controlled Outcome: Progressing

## 2023-07-23 NOTE — Progress Notes (Signed)
 NAME:  Albert Turetsky., MRN:  [id number], DOB:  22-May-1945, LOS: 13 ADMISSION DATE:  07/10/2023, CONSULTATION DATE:  07/10/2023 REFERRING MD:  Scarlette Currier - EDP, CHIEF COMPLAINT: Abnormal labs   History of Present Illness:  A 78 yr old male patient with hx of SDH (s/p mini crani/evac 12/2022 at [facility] c/b R cerebellar hemorrhage and seizures), multiple aspiration PNA, failed extubation, chronic resp failure requiring trach/PEG, PTSD, DM-2, HTN, CAD, GERD, ? Dementia, Afib (off NOAC), PCM, and anemia of chronic illness. He has been a resident of [facility] since 01/2023 when he was dc from [facility] for SDH/sequelae of. He presented to [facility] ED 5/1 from [facility] for further evaluation of abnormal labs -- noted to have elevated Cr and low Hgb. Foley exchanged. Given one unit of PRBC and 1 L NS 0.9% bolus.   On stable vent settings (PRVC 20/510/+5/40%) and without trach complications.  PCCM consulted for evaluation in this setting.  Pertinent Medical History:  SDH, seizures (weaned off Keppra), multiple aspiration PNA, failed extubation, chronic resp failure requiring trach/PEG, PTSD, DM-2, HTN, CAD, GERD, ? Dementia, Afib (off NOAC), PCM, and anemia of chronic illness  Significant Hospital Events: Including procedures, antibiotic start and stop dates in addition to other pertinent events   5/1 1U PRBC for hgb 6.6. PCCM consult Foley exchanged in the ED here. 5/2 Cultures positive for klebsiella with CRE. 5/8 Remains on PRVC, Wife met with palliative, DNR but continue current interventions  5/9 Remains on vent via trach, hgb dropped to 6.8 one unit PRBC ordered  5/10: restless. On TF, water  flushes 250 cc q 4 hrs, Na 144. MV: PRVC 20/510/+5/40%. Failed PSV 07/15/2023, Positive 1.5 L fluid balance in 7 days 5/11: less restless. MV: PRVC 20/510/+5/40%. Failed PSV 07/15/2023, Positive 3.3 L fluid balance in 8 days 5/12: less restless. MV: PRVC 20/510/+5/40%. Failed PSV 07/15/2023 and today due to tachypnea, Positive 3.5 L  fluid balance in 9 days. On TF 5/13: comfortable today. MV: PRVC 20/510/+5/40%. Failed PSV 07/15/2023 and 07/21/2023 due to tachypnea. Positive 3.3 L fluid balance in 10 days. On TF 5/14: hematuria, 1 unit PRBC Tx, seen by urology, on foley flushes, Lovenox  was d/c. Comfortable. MV: PRVC 20/510/+5/40%. Failed PSV 07/15/2023 and 07/21/2023 due to tachypnea. Positive 2.6 L fluid balance in 11 days. On TF  Interim History / Subjective:  No family at bedside Unable to follow commands    Objective:  Blood pressure (!) 167/117, pulse 88, temperature 98 F (36.7 C), temperature source Axillary, resp. rate (!) 24, height 5\' 6"  (1.676 m), weight 77.5 kg, SpO2 93%.    Vent Mode: PRVC FiO2 (%):  [40 %] 40 % Set Rate:  [15 bmp] 15 bmp Vt Set:  [510 mL] 510 mL PEEP:  [5 cmH20] 5 cmH20 Plateau Pressure:  [26 cmH20-28 cmH20] 28 cmH20   Intake/Output Summary (Last 24 hours) at 07/23/2023 1358 Last data filed at 07/23/2023 1201 Gross per 24 hour  Intake 1158.02 ml  Output 1945 ml  Net -786.98 ml   Filed Weights   07/20/23 0452 07/21/23 0429 07/22/23 0500  Weight: 79 kg 79.2 kg 77.5 kg   Physical Examination: Afebrile. V/S are stable  PERL Trach Shiley #6 cuffed Lungs: Less crackles. No wheezing. Symmetrical air entry Heart: NL S1/S2, no m/g/r Abd: no distension or tenderness. PEG tube LL: +1 edema, symmetrical  CNS: Opens eyes to verbal stimulation. Tracking. Does not follow commands. Some spontaneous non-purposeful upper extremity movements  Resolved Hospital Problem List:  Assessment & Plan:  Chronic respiratory failure Trach/vent dependence  -Cont MV support, routine trach care. He has been in the same setting for 6 months and bedridden. He will not have meaningful life. Will try weaning after diuresis. Wife agreed to DNR, not comfort care. Palliative care consult  -VAP pulm hygiene and with lung protective strategies -07/11/2023 Echo: EF 50%, ?diastolic dysfunction.  -Duoneb -Head of  bed elevated 30 degrees. -Plateau pressures less than 30 cm H20.  -Follow intermittent chest x-ray and ABG.   -SAT/SBT as tolerated, mentation preclude extubation  -Ensure adequate pulmonary hygiene  -PAD protocol   HyperNa: better  -On free water  flushes to 300 cc q 4 hrs   AKI/?CKD: Cr is stable -Am labs PRN -Foley exchanged in ED.  -Renal U/S: negative    SDH (s/p mini crani/evac 12/2022 at [facility] c/b R cerebellar hemorrhage and seizures) and metabolic encephalopathy, and Dementia -delirium precautions  -minimize CNS depressing meds  -Ammonia level: NL -Seroquel PRN    DM-2: HbA1c (7.9%) -Glycemic control -Lispro and Lantus . No more hypoglycemic episodes      HTN, CAD, s/p CABG, S-CHF   Afib (off NOAC) 07/11/2023 Echo: EF 50%, possible diastolic dysfunction. -Monitor BP and HR   GERD -PPI   Severe PCM -Dietitian consult -On TF/PEG tube    Acute anemia on chronic anemia of chronic illness: abd wall hematomas  -SCD -Blood Tx for Hb>7, s/p 1 unit PRBC Tx. All anemia w/u indicates slow MSK hematomas, GI loss or renal loss.   -s/p 1 unit PRBC Tx due to foley cath-related hematuria: seen by urology, on foley flushes, Lovenox  was d/c.  -am CBC   Severe sepsis, CRE Klebsiella UTI and bacteremia: Foley exchanged on arrival -Continue Avycaz  for 2 weeks, last dose 5/16 -Repeat Bcx2 07/13/2023: negative -Repeat Bcx 07/18/2023 per ID: negative  -Seen by ID -PICC line 07/20/2023   Best Practice (right click and "Reselect all SmartList Selections" daily)  Diet/type: tube feeds DVT prophylaxis LMWH Pressure ulcer(s): N/A GI prophylaxis: PPI Lines: PICC line Foley: Yes, and it is still needed Code Status: DNR Last date of multidisciplinary goals of care discussion []   Signature:  CRITICAL CARE Madelynn Schilder, MD  Pulmonary & Critical Care Office: [phone]  Total critical care time: 36 minutes

## 2023-07-23 NOTE — TOC Progression Note (Addendum)
 Transition of Care (TOC) - Progression Note    Patient Details  Name: Albert Vance. MRN: [id number] Date of Birth: 02/26/1946  Transition of Care [facility]) CM/SW Contact  Katrinka Parr, [place] Phone Number: 07/23/2023, 2:03 PM  Clinical Narrative:     [facility]. Family did not want pt to return to [facility] and were hoping pt could go to [facility]. [facility] had [facility] for him but had concerns about his hemoglobin and said they couldn't take him until he improved. [facility] explained they would be willing to look at clinicals and reconsider pt once pt improved. TOC continuing to follow.   CSW met with pt spouse bedside and updated her.  Clinicals have been re-sent to  [phone].  CSW spoke with Landa Pine at [facility] who states she has received the clinicals and her clinical team is reviewing. TOC to continue to follow.   Expected Discharge Plan: Skilled Nursing Facility Barriers to Discharge: Continued Medical Work up, SNF Pending bed offer  Expected Discharge Plan and Services     Post Acute Care Choice: Skilled Nursing Facility Living arrangements for the past 2 months:  (admitted from [facility])                                       Social Determinants of Health (SDOH) Interventions SDOH Screenings   Food Insecurity: No Food Insecurity (07/14/2023)  Housing: Low Risk  (07/14/2023)  Transportation Needs: Patient Unable To Answer (07/14/2023)  Utilities: Not At Risk (07/14/2023)  Social Connections: Patient Unable To Answer (07/14/2023)  Tobacco Use: Medium Risk (07/19/2023)    Readmission Risk Interventions     No data to display

## 2023-07-23 NOTE — Progress Notes (Signed)
 eLink Physician-Brief Progress Note Patient Name: Albert Vance. DOB: 1945/08/19 MRN: [id number]   Date of Service  07/23/2023  HPI/Events of Note  Hemoglobin 6.8 gm / dl, no evidence of active bleeding.  eICU Interventions  One unit of PRBC ordered transfused.        [name] 07/23/2023, 5:37 AM

## 2023-07-23 NOTE — Plan of Care (Signed)
 Presented at bedside at the request of Dr. Marene Shape to assess new onset hematuria x 3h in the context of Foley catheter and prophylactic Lovenox .  The overwhelming majority of the time catheters are accidentally pulled during patient care while patient is on DVT prophylaxis, resulting in mild hematuria.  This is very mild hematuria and does not require intervention at this time.  I have recommended to hold the Lovenox  and transition to SCDs, being careful with the Foley catheter, and monitoring.  Urology will formally consult should he acutely worsen and require intervention. Nursing to hand irrigate as needed.

## 2023-07-23 NOTE — Plan of Care (Signed)
  Problem: Activity: Goal: Ability to tolerate increased activity will improve Outcome: Progressing   Problem: Respiratory: Goal: Ability to maintain a clear airway and adequate ventilation will improve Outcome: Progressing   Problem: Education: Goal: Knowledge of General Education information will improve Description: Including pain rating scale, medication(s)/side effects and non-pharmacologic comfort measures Outcome: Progressing   Problem: Clinical Measurements: Goal: Will remain free from infection Outcome: Progressing Goal: Diagnostic test results will improve Outcome: Progressing Goal: Respiratory complications will improve Outcome: Progressing Goal: Cardiovascular complication will be avoided Outcome: Progressing   Problem: Nutrition: Goal: Adequate nutrition will be maintained Outcome: Progressing   Problem: Coping: Goal: Level of anxiety will decrease Outcome: Progressing   Problem: Elimination: Goal: Will not experience complications related to bowel motility Outcome: Progressing Goal: Will not experience complications related to urinary retention Outcome: Progressing   Problem: Pain Managment: Goal: General experience of comfort will improve and/or be controlled Outcome: Progressing   Problem: Safety: Goal: Ability to remain free from injury will improve Outcome: Progressing   Problem: Skin Integrity: Goal: Risk for impaired skin integrity will decrease Outcome: Progressing

## 2023-07-24 DIAGNOSIS — J9611 Chronic respiratory failure with hypoxia: Secondary | ICD-10-CM | POA: Diagnosis not present

## 2023-07-24 DIAGNOSIS — Z930 Tracheostomy status: Secondary | ICD-10-CM | POA: Diagnosis not present

## 2023-07-24 DIAGNOSIS — G9341 Metabolic encephalopathy: Secondary | ICD-10-CM | POA: Diagnosis not present

## 2023-07-24 DIAGNOSIS — E870 Hyperosmolality and hypernatremia: Secondary | ICD-10-CM | POA: Diagnosis not present

## 2023-07-24 LAB — TYPE AND SCREEN
ABO/RH(D): A POS
Antibody Screen: NEGATIVE
Unit division: 0

## 2023-07-24 LAB — HEMOGLOBIN AND HEMATOCRIT, BLOOD
HCT: 26.5 % — ABNORMAL LOW (ref 39.0–52.0)
Hemoglobin: 7.9 g/dL — ABNORMAL LOW (ref 13.0–17.0)

## 2023-07-24 LAB — BPAM RBC
Blood Product Expiration Date: 202506122359
ISSUE DATE / TIME: 202505140837
Unit Type and Rh: 6200

## 2023-07-24 LAB — GLUCOSE, CAPILLARY
Glucose-Capillary: 134 mg/dL — ABNORMAL HIGH (ref 70–99)
Glucose-Capillary: 151 mg/dL — ABNORMAL HIGH (ref 70–99)
Glucose-Capillary: 157 mg/dL — ABNORMAL HIGH (ref 70–99)
Glucose-Capillary: 174 mg/dL — ABNORMAL HIGH (ref 70–99)
Glucose-Capillary: 205 mg/dL — ABNORMAL HIGH (ref 70–99)
Glucose-Capillary: 96 mg/dL (ref 70–99)

## 2023-07-24 MED ORDER — FREE WATER
300.0000 mL | Freq: Four times a day (QID) | Status: DC
  Administered 2023-07-24 – 2023-07-25 (×4): 300 mL

## 2023-07-24 MED ORDER — DOCUSATE SODIUM 50 MG/5ML PO LIQD
100.0000 mg | Freq: Two times a day (BID) | ORAL | Status: DC
  Administered 2023-07-24 – 2023-07-30 (×11): 100 mg
  Filled 2023-07-24 (×12): qty 10

## 2023-07-24 MED ORDER — FUROSEMIDE 10 MG/ML IJ SOLN
40.0000 mg | Freq: Once | INTRAMUSCULAR | Status: AC
  Administered 2023-07-24: 40 mg via INTRAVENOUS
  Filled 2023-07-24: qty 4

## 2023-07-24 MED ORDER — BISACODYL 5 MG PO TBEC: 5.0000 mg | DELAYED_RELEASE_TABLET | Freq: Every day | ORAL | Status: DC

## 2023-07-24 MED ORDER — INSULIN ASPART 100 UNIT/ML IJ SOLN
2.0000 [IU] | INTRAMUSCULAR | Status: DC
  Administered 2023-07-24 – 2023-07-28 (×21): 2 [IU] via SUBCUTANEOUS

## 2023-07-24 MED ORDER — SENNA 8.6 MG PO TABS
2.0000 | ORAL_TABLET | Freq: Every day | ORAL | Status: DC
  Administered 2023-07-24 – 2023-07-30 (×7): 17.2 mg
  Filled 2023-07-24 (×7): qty 2

## 2023-07-24 NOTE — Plan of Care (Signed)
  Problem: Respiratory: Goal: Ability to maintain a clear airway and adequate ventilation will improve Outcome: Progressing   Problem: Education: Goal: Knowledge of General Education information will improve Description: Including pain rating scale, medication(s)/side effects and non-pharmacologic comfort measures Outcome: Progressing   Problem: Clinical Measurements: Goal: Will remain free from infection Outcome: Progressing Goal: Diagnostic test results will improve Outcome: Progressing Goal: Respiratory complications will improve Outcome: Progressing Goal: Cardiovascular complication will be avoided Outcome: Progressing   Problem: Activity: Goal: Risk for activity intolerance will decrease Outcome: Progressing   Problem: Coping: Goal: Level of anxiety will decrease Outcome: Progressing   Problem: Pain Managment: Goal: General experience of comfort will improve and/or be controlled Outcome: Progressing   Problem: Safety: Goal: Ability to remain free from injury will improve Outcome: Progressing   Problem: Skin Integrity: Goal: Risk for impaired skin integrity will decrease Outcome: Progressing

## 2023-07-24 NOTE — Progress Notes (Signed)
 Notified by nurse tech that foley came out while performing  foley care. Balloon appears deflated on assessment.  Will exchange foley.

## 2023-07-24 NOTE — Plan of Care (Signed)
  Problem: Respiratory: Goal: Ability to maintain a clear airway and adequate ventilation will improve Outcome: Progressing   Problem: Clinical Measurements: Goal: Will remain free from infection Outcome: Progressing Goal: Respiratory complications will improve Outcome: Progressing Goal: Cardiovascular complication will be avoided Outcome: Progressing

## 2023-07-24 NOTE — TOC Progression Note (Signed)
 Transition of Care (TOC) - Progression Note    Patient Details  Name: Tennessee Routson. MRN: [id number] Date of Birth: [birth year]  Transition of Care [facility]) CM/SW Contact  Paullette Boston [place], [place] Phone Number: 07/24/2023, 2:50 PM  Clinical Narrative:  Spoke to Landa Pine at [facility] who reports their clinical team is in the process of reviewing updated information faxed yesterday. Anticipate a determination by tomorrow. Will provide updates as available.   Paullette Boston, MSW, LCSW [phone] (coverage)       Expected Discharge Plan: Skilled Nursing Facility Barriers to Discharge: Continued Medical Work up, SNF Pending bed offer  Expected Discharge Plan and Services     Post Acute Care Choice: Skilled Nursing Facility Living arrangements for the past 2 months:  (admitted from [facility])                                       Social Determinants of Health (SDOH) Interventions SDOH Screenings   Food Insecurity: No Food Insecurity (07/14/2023)  Housing: Low Risk  (07/14/2023)  Transportation Needs: Patient Unable To Answer (07/14/2023)  Utilities: Not At Risk (07/14/2023)  Social Connections: Patient Unable To Answer (07/14/2023)  Tobacco Use: Medium Risk (07/19/2023)    Readmission Risk Interventions     No data to display

## 2023-07-24 NOTE — Progress Notes (Signed)
 NAME:  Albert Stas., MRN:  [id number], DOB:  [birth year], LOS: 14 ADMISSION DATE:  07/10/2023, CONSULTATION DATE:  07/10/2023 REFERRING MD:  Scarlette Currier - EDP, CHIEF COMPLAINT: Abnormal labs   History of Present Illness:  A 78 yr old male patient with hx of SDH (s/p mini crani/evac 12/2022 at [facility] c/b R cerebellar hemorrhage and seizures), multiple aspiration PNA, failed extubation, chronic resp failure requiring trach/PEG, PTSD, DM-2, HTN, CAD, GERD, ? Dementia, Afib (off NOAC), PCM, and anemia of chronic illness. He has been a resident of [facility] since 01/2023 when he was dc from [facility] for SDH/sequelae of. He presented to [facility] ED 5/1 from [facility] for further evaluation of abnormal labs -- noted to have elevated Cr and low Hgb. Foley exchanged. Given one unit of PRBC and 1 L NS 0.9% bolus.   On stable vent settings (PRVC 20/510/+5/40%) and without trach complications.  PCCM consulted for evaluation in this setting.  Pertinent Medical History:  SDH, seizures (weaned off Keppra), multiple aspiration PNA, failed extubation, chronic resp failure requiring trach/PEG, PTSD, DM-2, HTN, CAD, GERD, ? Dementia, Afib (off NOAC), PCM, and anemia of chronic illness  Significant Hospital Events: Including procedures, antibiotic start and stop dates in addition to other pertinent events   5/1 1U PRBC for hgb 6.6. PCCM consult Foley exchanged in the ED here. 5/2 Cultures positive for klebsiella with CRE. 5/8 Remains on PRVC, Wife met with palliative, DNR but continue current interventions  5/9 Remains on vent via trach, hgb dropped to 6.8 one unit PRBC ordered  5/10: restless. On TF, water  flushes 250 cc q 4 hrs, Na 144. MV: PRVC 20/510/+5/40%. Failed PSV 07/15/2023, Positive 1.5 L fluid balance in 7 days 5/11: less restless. MV: PRVC 20/510/+5/40%. Failed PSV 07/15/2023, Positive 3.3 L fluid balance in 8 days 5/12: less restless. MV: PRVC 20/510/+5/40%. Failed PSV 07/15/2023 and today due to tachypnea, Positive 3.5 L  fluid balance in 9 days. On TF 5/13: comfortable today. MV: PRVC 20/510/+5/40%. Failed PSV 07/15/2023 and 07/21/2023 due to tachypnea. Positive 3.3 L fluid balance in 10 days. On TF 5/14: hematuria, 1 unit PRBC Tx, seen by urology, on foley flushes, Lovenox  was d/c. Comfortable. MV: PRVC 20/510/+5/40%. Failed PSV 07/15/2023 and 07/21/2023 due to tachypnea. Positive 2.6 L fluid balance in 11 days. On TF 5/15  adding EN coverage insulin    Interim History / Subjective:    Mild foley trauma related hematuria  NAEO    Objective:  Blood pressure (!) 157/86, pulse 81, temperature 98.1 F (36.7 C), temperature source Axillary, resp. rate (!) 41, height 5\' 6"  (1.676 m), weight 77.5 kg, SpO2 95%.    Vent Mode: PRVC FiO2 (%):  [40 %] 40 % Set Rate:  [15 bmp] 15 bmp Vt Set:  [510 mL] 510 mL PEEP:  [5 cmH20-8 cmH20] 8 cmH20 Plateau Pressure:  [24 cmH20] 24 cmH20   Intake/Output Summary (Last 24 hours) at 07/24/2023 1006 Last data filed at 07/24/2023 0941 Gross per 24 hour  Intake 1478.23 ml  Output 2300 ml  Net -821.77 ml   Filed Weights   07/20/23 0452 07/21/23 0429 07/22/23 0500  Weight: 79 kg 79.2 kg 77.5 kg   Physical Examination:  Gen: Chronically critically ill elderly M NAD  Neuro: eyes are open and he is moaning, not following commands  HEENT: Trach secure. Anicteric sclera  CV: rr cap refill < 3 sec  Pulm: mechanically ventilated  GI: PEG  GU: foley, no hematuria  MSK: no acute appearing  joint deformity. Edematous  Skin: scattered ecchymosis.    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 and UTI  -foley exchanged on admission -Repeat Bcx2 07/13/2023: negative -Repeat Bcx 07/18/2023 per ID: negative  -picc placed 5/11 P -2wk course avycaz  -- last dose 5/16    Encephalopathy ?Dementia  Hx SDH  -mini crani/evac at [facility] 10/24 c/b R cerebellar bleed, sz  P  -delirium precautions -minimize CNS depressing meds -namenda, provigil    Presumed CKD  -AKI v CKD was considered, I favor CKD given stability of his Cr  P -PRN BMP -decr FWF to 300 q6 -as above, lasix  5/15  HTN CAD s/p CABG Systolic HF Hx Afib not on AC  P -cardiac monitoring  -amlodipine  10mg   -lasix  x1 5/15  -optimize lytes   AoC anemia -chronic, critical illness -abdominal wall hematomas, mild hematuria  P -holding chemical vte ppx for now, check CBC 5/16 and consider restarting lovenox    GERD Constipation -Pepcid [facility] bowel reg   DM2 w hyperglycemia  P -SSI -semglee  12 u daily -adding EN coverage 2u q4   Severe protein calorie malnutrition  -EN per RDN   GOC DNR -Cont MV support, routine trach care. He has been in the same setting for 6 months and bedridden. He will not have meaningful life. Will try weaning after diuresis. Wife agreed to DNR, not comfort care. Palliative care consult    Best Practice (right click and "Reselect all SmartList Selections" daily)  Diet/type: tube feeds DVT prophylaxis SCD  Pressure ulcer(s): N/A GI prophylaxis: H2B Lines: PICC line Foley: Yes, and it is still needed Code Status: DNR Last date of multidisciplinary goals of care discussion [  ]  Signature:   CRITICAL CARE Performed by: Delories Fetter   Total critical care time: 37 minutes  Critical care time was exclusive of separately billable procedures and treating other patients. Critical care was necessary to treat or prevent imminent or life-threatening deterioration.  Critical care was time spent personally by me on the following activities: development of treatment plan with patient and/or surrogate as well as nursing, discussions with consultants, evaluation of patient's response to treatment, examination of patient, obtaining history from patient or  surrogate, ordering and performing treatments and interventions, ordering and review of laboratory studies, ordering and review of radiographic studies, pulse oximetry and re-evaluation of patient's condition.  Eston Hence MSN, AGACNP-BC [facility]/Critical [facility] Amion for pager 07/24/2023, 10:06 AM

## 2023-07-24 NOTE — Progress Notes (Signed)
 Discussed w pt wife. Updates provided. Questions answered. She voiced concern about what to do -- she went into all of this [facility] hospitalization course onward) with thought that he would return to baseline -- she said that she was even told "better than before" by one physician at OSH-- so the state of things dating back to moving to LTAC side of [facility] to now are difficult to navigate.  Encouraged her to think about QOL goals, and plan to talk more about this tomorrow when she is here in person.    Add'l CCT 37 min spent in conversation.    Eston Hence MSN, AGACNP-BC [facility] Pulmonary/Critical Care Medicine 07/24/2023, 2:30 PM

## 2023-07-25 ENCOUNTER — Inpatient Hospital Stay (HOSPITAL_COMMUNITY)

## 2023-07-25 DIAGNOSIS — E870 Hyperosmolality and hypernatremia: Secondary | ICD-10-CM | POA: Diagnosis not present

## 2023-07-25 DIAGNOSIS — G9341 Metabolic encephalopathy: Secondary | ICD-10-CM | POA: Diagnosis not present

## 2023-07-25 DIAGNOSIS — Z930 Tracheostomy status: Secondary | ICD-10-CM | POA: Diagnosis not present

## 2023-07-25 DIAGNOSIS — J9611 Chronic respiratory failure with hypoxia: Secondary | ICD-10-CM | POA: Diagnosis not present

## 2023-07-25 LAB — GLUCOSE, CAPILLARY
Glucose-Capillary: 142 mg/dL — ABNORMAL HIGH (ref 70–99)
Glucose-Capillary: 139 mg/dL — ABNORMAL HIGH (ref 70–99)
Glucose-Capillary: 239 mg/dL — ABNORMAL HIGH (ref 70–99)
Glucose-Capillary: 204 mg/dL — ABNORMAL HIGH (ref 70–99)
Glucose-Capillary: 179 mg/dL — ABNORMAL HIGH (ref 70–99)

## 2023-07-25 LAB — CBC
HCT: 26.3 % — ABNORMAL LOW (ref 39.0–52.0)
Hemoglobin: 7.9 g/dL — ABNORMAL LOW (ref 13.0–17.0)
MCH: 30.4 pg (ref 26.0–34.0)
MCHC: 30 g/dL (ref 30.0–36.0)
MCV: 101.2 fL — ABNORMAL HIGH (ref 80.0–100.0)
Platelets: 135 10*3/uL — ABNORMAL LOW (ref 150–400)
RBC: 2.6 MIL/uL — ABNORMAL LOW (ref 4.22–5.81)
RDW: 18.1 % — ABNORMAL HIGH (ref 11.5–15.5)
WBC: 6.6 10*3/uL (ref 4.0–10.5)
nRBC: 0 % (ref 0.0–0.2)

## 2023-07-25 LAB — BASIC METABOLIC PANEL WITH GFR
Anion gap: 6 (ref 5–15)
BUN: 112 mg/dL — ABNORMAL HIGH (ref 8–23)
CO2: 28 mmol/L (ref 22–32)
Calcium: 8.9 mg/dL (ref 8.9–10.3)
Chloride: 110 mmol/L (ref 98–111)
Creatinine, Ser: 1.47 mg/dL — ABNORMAL HIGH (ref 0.61–1.24)
GFR, Estimated: 48 mL/min — ABNORMAL LOW (ref >60)
Glucose, Bld: 149 mg/dL — ABNORMAL HIGH (ref 70–99)
Potassium: 4.5 mmol/L (ref 3.5–5.1)
Sodium: 144 mmol/L (ref 135–145)

## 2023-07-25 MED ORDER — FENTANYL CITRATE PF 50 MCG/ML IJ SOSY
PREFILLED_SYRINGE | INTRAMUSCULAR | Status: AC
  Filled 2023-07-25: qty 1

## 2023-07-25 MED ORDER — FUROSEMIDE 10 MG/ML IJ SOLN
40.0000 mg | Freq: Once | INTRAMUSCULAR | Status: AC
  Administered 2023-07-25: 40 mg via INTRAVENOUS
  Filled 2023-07-25: qty 4

## 2023-07-25 MED ORDER — GERHARDT'S BUTT CREAM
TOPICAL_CREAM | Freq: Two times a day (BID) | CUTANEOUS | Status: DC
  Administered 2023-07-26 – 2023-07-27 (×2): 1 via TOPICAL
  Filled 2023-07-25: qty 60

## 2023-07-25 MED ORDER — POLYETHYLENE GLYCOL 3350 17 G PO PACK
17.0000 g | PACK | Freq: Two times a day (BID) | ORAL | Status: DC
  Administered 2023-07-25 – 2023-07-30 (×9): 17 g
  Filled 2023-07-25 (×8): qty 1

## 2023-07-25 MED ORDER — DOXAZOSIN MESYLATE 2 MG PO TABS
2.0000 mg | ORAL_TABLET | Freq: Every day | ORAL | Status: DC
  Administered 2023-07-25 – 2023-07-30 (×6): 2 mg
  Filled 2023-07-25 (×6): qty 1

## 2023-07-25 MED ORDER — FENTANYL CITRATE PF 50 MCG/ML IJ SOSY
50.0000 ug | PREFILLED_SYRINGE | Freq: Once | INTRAMUSCULAR | Status: AC
  Administered 2023-07-25: 50 ug via INTRAVENOUS

## 2023-07-25 MED ORDER — SODIUM CHLORIDE 3 % IN NEBU
4.0000 mL | INHALATION_SOLUTION | Freq: Every day | RESPIRATORY_TRACT | Status: AC
  Administered 2023-07-25 – 2023-07-27 (×3): 4 mL via RESPIRATORY_TRACT
  Filled 2023-07-25 (×3): qty 15

## 2023-07-25 NOTE — Progress Notes (Signed)
 RT was called by NP due to tracheostomy cuff leak. Pt suspected of mucus plugging. Pt was bagged and lavaged. Moderate/tan-bloody/thick secretions aspirated. Pt placed back on full support and is VSS at this time.

## 2023-07-25 NOTE — Progress Notes (Signed)
   07/25/23 0827  Daily Weaning Assessment  Daily Assessment of Readiness to Wean Wean protocol criteria met (SBT performed)  SBT Method CPAP 5 cm H20 and PS 5 cm H20 (PS 12)  Weaning Start Time 0827   Pt was placed on PS/CPAP and is tolerating well at this time. Back up settings in place.

## 2023-07-25 NOTE — TOC Progression Note (Signed)
 Transition of Care (TOC) - Progression Note    Patient Details  Name: Albert Vance. MRN: [id number] Date of Birth: 1945-06-27  Transition of Care [facility]) CM/SW Contact  Paullette Boston [place], [place] Phone Number: 07/25/2023, 4:02 PM  Clinical Narrative:   Spoke to [facility] [phone] at 2pm requesting update re bed availability and was told a determination would be made today. Call placed to [facility] at 3pm (no answer) and again at 4pm (left voicemail requesting return call). Will continue efforts to secure appropriate placement.   Paullette Boston, MSW, LCSW [phone] (coverage)       Expected Discharge Plan: Skilled Nursing Facility Barriers to Discharge: Continued Medical Work up, SNF Pending bed offer  Expected Discharge Plan and Services     Post Acute Care Choice: Skilled Nursing Facility Living arrangements for the past 2 months:  (admitted from [facility])                                       Social Determinants of Health (SDOH) Interventions SDOH Screenings   Food Insecurity: No Food Insecurity (07/14/2023)  Housing: Low Risk  (07/14/2023)  Transportation Needs: Patient Unable To Answer (07/14/2023)  Utilities: Not At Risk (07/14/2023)  Social Connections: Patient Unable To Answer (07/14/2023)  Tobacco Use: Medium Risk (07/19/2023)    Readmission Risk Interventions     No data to display

## 2023-07-25 NOTE — Progress Notes (Signed)
 NAME:  Albert Shiner., MRN:  [id number], DOB:  [birth year], LOS: 15 ADMISSION DATE:  07/10/2023, CONSULTATION DATE:  07/10/2023 REFERRING MD:  Scarlette Currier - EDP, CHIEF COMPLAINT: Abnormal labs   History of Present Illness:  A 78 yr old male patient with hx of SDH (s/p mini crani/evac 12/2022 at [facility] c/b R cerebellar hemorrhage and seizures), multiple aspiration PNA, failed extubation, chronic resp failure requiring trach/PEG, PTSD, DM-2, HTN, CAD, GERD, ? Dementia, Afib (off NOAC), PCM, and anemia of chronic illness. He has been a resident of [facility] since 01/2023 when he was dc from [facility] for SDH/sequelae of. He presented to [facility] ED 5/1 from [facility] for further evaluation of abnormal labs -- noted to have elevated Cr and low Hgb. Foley exchanged. Given one unit of PRBC and 1 L NS 0.9% bolus.   On stable vent settings (PRVC 20/510/+5/40%) and without trach complications.  PCCM consulted for evaluation in this setting.  Pertinent Medical History:  SDH, seizures (weaned off Keppra), multiple aspiration PNA, failed extubation, chronic resp failure requiring trach/PEG, PTSD, DM-2, HTN, CAD, GERD, ? Dementia, Afib (off NOAC), PCM, and anemia of chronic illness  Significant Hospital Events: Including procedures, antibiotic start and stop dates in addition to other pertinent events   5/1 1U PRBC for hgb 6.6. PCCM consult Foley exchanged in the ED here. 5/2 Cultures positive for klebsiella with CRE. 5/8 Remains on PRVC, Wife met with palliative, DNR but continue current interventions  5/9 Remains on vent via trach, hgb dropped to 6.8 one unit PRBC ordered  5/10: restless. On TF, water  flushes 250 cc q 4 hrs, Na 144. MV: PRVC 20/510/+5/40%. Failed PSV 07/15/2023, Positive 1.5 L fluid balance in 7 days 5/11: less restless. MV: PRVC 20/510/+5/40%. Failed PSV 07/15/2023, Positive 3.3 L fluid balance in 8 days 5/12: less restless. MV: PRVC 20/510/+5/40%. Failed PSV 07/15/2023 and today due to tachypnea, Positive 3.5 L  fluid balance in 9 days. On TF 5/13: comfortable today. MV: PRVC 20/510/+5/40%. Failed PSV 07/15/2023 and 07/21/2023 due to tachypnea. Positive 3.3 L fluid balance in 10 days. On TF 5/14: hematuria, 1 unit PRBC Tx, seen by urology, on foley flushes, Lovenox  was d/c. Comfortable. MV: PRVC 20/510/+5/40%. Failed PSV 07/15/2023 and 07/21/2023 due to tachypnea. Positive 2.6 L fluid balance in 11 days. On TF 5/15  adding EN coverage insulin   5/16 complete avycaz . lasix . Plan to talk w wife re GOC   Interim History / Subjective:    Good response to lasix  -- 3.5 L uop   Mild foley trauma related hematuria  NAEO    Objective:  Blood pressure (!) 157/82, pulse 77, temperature 99.1 F (37.3 C), temperature source Axillary, resp. rate (!) 30, height 5\' 6"  (1.676 m), weight 77.5 kg, SpO2 100%.    Vent Mode: PSV;CPAP FiO2 (%):  [40 %] 40 % Set Rate:  [15 bmp] 15 bmp Vt Set:  [510 mL] 510 mL PEEP:  [5 cmH20-8 cmH20] 5 cmH20 Pressure Support:  [12 cmH20] 12 cmH20 Plateau Pressure:  [18 cmH20-26 cmH20] 18 cmH20   Intake/Output Summary (Last 24 hours) at 07/25/2023 0953 Last data filed at 07/25/2023 0800 Gross per 24 hour  Intake 1134.3 ml  Output 3140 ml  Net -2005.7 ml   Filed Weights   07/20/23 0452 07/21/23 0429 07/22/23 0500  Weight: 79 kg 79.2 kg 77.5 kg   Physical Examination:  Gen: Chronically critically ill appearing elderly M NAD  Neuro: Eyes are open he is not consistently tracking, does not  follow commands  HEENT: Trach secure. Anicteric sclera  CV: rr cap refill <3 sec  Pulm: mechanically ventilated. Symmetrical chest expansion. Unlabored on PSV, but with slightly incr RR  GI: PEG. Hypoactive sounds  GU: foley. Yellow urine.  MSK: no acute appearing joint deformity.  Skin: pale. Scattered ecchymosis   Resolved Hospital Problem List:    Assessment & Plan:    Chronic resp failure Trach/vent dependence  P -lasix  again 5/16 -cont routine trach care and MV support -SBT 5/16  -- if able will try to transition to ATC.  -PRN CXR  -duoneb   Sev sepsis due to CRE klebsiella bacteremia UTI and HCAP (klebsiella and pseudomonas)  -foley exchanged on admission -Repeat Bcx2 07/13/2023: negative -Repeat Bcx 07/18/2023 per ID: negative  -picc placed 5/11 P -2wk course avycaz  -- last dose 5/16  -when completes avycaz  will see if we can get PIV and dc PICC. Isnt getting much by way of IV meds.   Encephalopathy Reported hx dementia Hx SDH c/b bleed and sz  -mini crani/evac at [facility] 10/24 c/b R cerebellar bleed, sz  P  -delirium precautions -minimize CNS depressing meds -namenda, provigil    CKD 3a, uremia  -AKI v CKD was considered, I favor CKD 3a given stability of his Cr and gfr  P -PRN BMP -stop FWF  -lasix  5/16   HTN CAD s/p CABG Systolic HF Hx Afib not on AC  P -cardiac monitoring  -amlodipine  10mg   -repeat lasix  5/16 -pending response to lasix , may need to add home hydral back. W renal dysfxn would err against restarting losartan  for now   -optimize lytes PRN   AoC anemia Thrombocytopenia, mild  -chronic, critical illness -abdominal wall hematomas, mild hematuria  P -holding chemical vte ppx -- AM CBC and cont to re-eval  GERD Constipation -Pepcid [facility] bowel reg -- adding miralax 5/16   DM2 w hyperglycemia  P -SSI -semglee  12 u daily  -adding EN coverage 2u q4  Severe protein calorie malnutrition  -EN per RDN   BPH -restart home cardura 5/16 -cont foley for now   GOC DNR -plan to talk w wife 5/16 and discuss GOC. Palliative following peripherally, will let them know if we can use their assistance    Best Practice (right click and "Reselect all SmartList Selections" daily)  Diet/type: tube feeds DVT prophylaxis SCD  Pressure ulcer(s): N/A GI prophylaxis: H2B Lines: PICC line Foley: Yes, and it is still needed Code Status: DNR Last date of multidisciplinary goals of care discussion [  ]  Signature:   CRITICAL  CARE Performed by: Delories Fetter   Total critical care time: 42 minutes  Critical care time was exclusive of separately billable procedures and treating other patients.  Critical care was necessary to treat or prevent imminent or life-threatening deterioration.  Critical care was time spent personally by me on the following activities: development of treatment plan with patient and/or surrogate as well as nursing, discussions with consultants, evaluation of patient's response to treatment, examination of patient, obtaining history from patient or surrogate, ordering and performing treatments and interventions, ordering and review of laboratory studies, ordering and review of radiographic studies, pulse oximetry and re-evaluation of patient's condition.  Eston Hence MSN, AGACNP-BC [facility] Pulmonary/Critical Care Medicine Amion for pager 07/25/2023, 9:53 AM

## 2023-07-25 NOTE — Plan of Care (Signed)
  Problem: Respiratory: Goal: Ability to maintain a clear airway and adequate ventilation will improve Outcome: Progressing   Problem: Nutrition: Goal: Adequate nutrition will be maintained Outcome: Progressing   Problem: Activity: Goal: Ability to tolerate increased activity will improve Outcome: Not Progressing

## 2023-07-25 NOTE — Progress Notes (Signed)
 To bedside to talk with wife. Provided clinical updates, answered questions. Talked about waxing/waning mental status, and concerns of significant physical deconditioning. She asked if we are going to stop providing care for him and I was very clear that we have been and are continuing to provide aggressive care for him.  Pt with RR in high 30s, placed pack to PRVC, suctioned. Developed large air leak, balloon appropriate. Suctioned, desatted to 80s, placed on 100%, portex trach manually held in position to minimize leak, RT en route to bedside  While suctioning pt Wife again asked if I was going to provide care for him and I again explained that we are.  Bagged by RT, lavaged x 2, with thick blood tinged secretions and great improvement in volumes.  STAT cxr ordered 50mcg fent ordered  Will add HTS nebs,  CPT   With the repeated questioning about it we are stopping care for the patient, I did not approach any goals of care discussions with wife.    Add'l CCT 37 min     Eston Hence MSN, AGACNP-BC [facility] Pulmonary/Critical Care Medicine 07/25/2023, 2:36 PM

## 2023-07-26 DIAGNOSIS — G9340 Encephalopathy, unspecified: Secondary | ICD-10-CM

## 2023-07-26 DIAGNOSIS — Z7189 Other specified counseling: Secondary | ICD-10-CM | POA: Diagnosis not present

## 2023-07-26 DIAGNOSIS — A419 Sepsis, unspecified organism: Secondary | ICD-10-CM | POA: Diagnosis not present

## 2023-07-26 DIAGNOSIS — J9611 Chronic respiratory failure with hypoxia: Secondary | ICD-10-CM | POA: Diagnosis not present

## 2023-07-26 DIAGNOSIS — N1831 Chronic kidney disease, stage 3a: Secondary | ICD-10-CM

## 2023-07-26 DIAGNOSIS — Z515 Encounter for palliative care: Secondary | ICD-10-CM | POA: Diagnosis not present

## 2023-07-26 DIAGNOSIS — E870 Hyperosmolality and hypernatremia: Secondary | ICD-10-CM | POA: Diagnosis not present

## 2023-07-26 DIAGNOSIS — R601 Generalized edema: Secondary | ICD-10-CM | POA: Diagnosis not present

## 2023-07-26 DIAGNOSIS — Z930 Tracheostomy status: Secondary | ICD-10-CM | POA: Diagnosis not present

## 2023-07-26 LAB — BASIC METABOLIC PANEL WITH GFR
Anion gap: 5 (ref 5–15)
BUN: 104 mg/dL — ABNORMAL HIGH (ref 8–23)
CO2: 26 mmol/L (ref 22–32)
Calcium: 8.5 mg/dL — ABNORMAL LOW (ref 8.9–10.3)
Chloride: 111 mmol/L (ref 98–111)
Creatinine, Ser: 1.44 mg/dL — ABNORMAL HIGH (ref 0.61–1.24)
GFR, Estimated: 49 mL/min — ABNORMAL LOW (ref >60)
Glucose, Bld: 196 mg/dL — ABNORMAL HIGH (ref 70–99)
Potassium: 4.5 mmol/L (ref 3.5–5.1)
Sodium: 142 mmol/L (ref 135–145)

## 2023-07-26 LAB — CBC
HCT: 25.2 % — ABNORMAL LOW (ref 39.0–52.0)
Hemoglobin: 7.4 g/dL — ABNORMAL LOW (ref 13.0–17.0)
MCH: 30 pg (ref 26.0–34.0)
MCHC: 29.4 g/dL — ABNORMAL LOW (ref 30.0–36.0)
MCV: 102 fL — ABNORMAL HIGH (ref 80.0–100.0)
Platelets: 116 10*3/uL — ABNORMAL LOW (ref 150–400)
RBC: 2.47 MIL/uL — ABNORMAL LOW (ref 4.22–5.81)
RDW: 17.9 % — ABNORMAL HIGH (ref 11.5–15.5)
WBC: 7.6 10*3/uL (ref 4.0–10.5)
nRBC: 0 % (ref 0.0–0.2)

## 2023-07-26 LAB — GLUCOSE, CAPILLARY
Glucose-Capillary: 107 mg/dL — ABNORMAL HIGH (ref 70–99)
Glucose-Capillary: 131 mg/dL — ABNORMAL HIGH (ref 70–99)
Glucose-Capillary: 134 mg/dL — ABNORMAL HIGH (ref 70–99)
Glucose-Capillary: 154 mg/dL — ABNORMAL HIGH (ref 70–99)
Glucose-Capillary: 167 mg/dL — ABNORMAL HIGH (ref 70–99)
Glucose-Capillary: 193 mg/dL — ABNORMAL HIGH (ref 70–99)
Glucose-Capillary: 228 mg/dL — ABNORMAL HIGH (ref 70–99)

## 2023-07-26 MED ORDER — FUROSEMIDE 10 MG/ML IJ SOLN
40.0000 mg | Freq: Four times a day (QID) | INTRAMUSCULAR | Status: AC
  Administered 2023-07-26 (×2): 40 mg via INTRAVENOUS
  Filled 2023-07-26 (×2): qty 4

## 2023-07-26 NOTE — Progress Notes (Signed)
 NAME:  Albert Vance., MRN:  [id number], DOB:  [birth year], LOS: 16 ADMISSION DATE:  07/10/2023, CONSULTATION DATE:  07/10/2023 REFERRING MD:  Scarlette Currier - EDP, CHIEF COMPLAINT: Abnormal labs   History of Present Illness:  A 78 yr old male patient with hx of SDH (s/p mini crani/evac 12/2022 at [facility] c/b R cerebellar hemorrhage and seizures), multiple aspiration PNA, failed extubation, chronic resp failure requiring trach/PEG, PTSD, DM-2, HTN, CAD, GERD, ? Dementia, Afib (off NOAC), PCM, and anemia of chronic illness. He has been a resident of [facility] since 01/2023 when he was dc [facility] for SDH/sequelae of. He presented to [facility] ED 5/1 from [facility] for further evaluation of abnormal labs -- noted to have elevated Cr and low Hgb. Foley exchanged. Given one unit of PRBC and 1 L NS 0.9% bolus.   On stable vent settings (PRVC 20/510/+5/40%) and without trach complications.  PCCM consulted for evaluation in this setting.  Pertinent Medical History:  SDH, seizures (weaned off Keppra), multiple aspiration PNA, failed extubation, chronic resp failure requiring trach/PEG, PTSD, DM-2, HTN, CAD, GERD, ? Dementia, Afib (off NOAC), PCM, and anemia of chronic illness  Significant Hospital Events: Including procedures, antibiotic start and stop dates in addition to other pertinent events   5/1 1U PRBC for hgb 6.6. PCCM consult Foley exchanged in the ED here. 5/2 Cultures positive for klebsiella with CRE. 5/8 Remains on PRVC, Wife met with palliative, DNR but continue current interventions  5/9 Remains on vent via trach, hgb dropped to 6.8 one unit PRBC ordered  5/10: restless. On TF, water  flushes 250 cc q 4 hrs, Na 144. MV: PRVC 20/510/+5/40%. Failed PSV 07/15/2023, Positive 1.5 L fluid balance in 7 days 5/11: less restless. MV: PRVC 20/510/+5/40%. Failed PSV 07/15/2023, Positive 3.3 L fluid balance in 8 days 5/12: less restless. MV: PRVC 20/510/+5/40%. Failed PSV 07/15/2023 and today due to tachypnea, Positive 3.5 L  fluid balance in 9 days. On TF 5/13: comfortable today. MV: PRVC 20/510/+5/40%. Failed PSV 07/15/2023 and 07/21/2023 due to tachypnea. Positive 3.3 L fluid balance in 10 days. On TF 5/14: hematuria, 1 unit PRBC Tx, seen by urology, on foley flushes, Lovenox  was d/c. Comfortable. MV: PRVC 20/510/+5/40%. Failed PSV 07/15/2023 and 07/21/2023 due to tachypnea. Positive 2.6 L fluid balance in 11 days. On TF 5/15  adding EN coverage insulin   5/16 complete avycaz . lasix . Plan to talk w wife re GOC   Interim History / Subjective:    Mucus plugging yesterday afternoon which required aggressive suctioning, bagging, lavage.  Neg negative 457cc.  +BM  Objective:  Blood pressure (!) 130/58, pulse 73, temperature 98.8 F (37.1 C), temperature source Axillary, resp. rate (!) 23, height 5\' 6"  (1.676 m), weight 77.5 kg, SpO2 97%.    Vent Mode: PRVC FiO2 (%):  [40 %-60 %] 40 % Set Rate:  [15 bmp] 15 bmp Vt Set:  [510 mL] 510 mL PEEP:  [5 cmH20] 5 cmH20 Pressure Support:  [12 cmH20] 12 cmH20   Intake/Output Summary (Last 24 hours) at 07/26/2023 0749 Last data filed at 07/26/2023 0700 Gross per 24 hour  Intake 1350 ml  Output 1825 ml  Net -475 ml   Filed Weights   07/20/23 0452 07/21/23 0429 07/22/23 0500  Weight: 79 kg 79.2 kg 77.5 kg   Physical Examination: Gen:      Intubated, sedated, acutely and chronically ill appearing HEENT:  trach to vent Lungs:    sounds of mechanical ventilation auscultated no wheeze CV:  RRR Abd:      Distended, soft, peg in place Ext:  Bilateral TMA, diffuse anasarca and pitting edema Skin:      pale skin, scattered ecchymoses Neuro:  not sedated, RASS -3. Does not follow commands   Labs reviewed Cr stable at 1.44  Resolved Hospital Problem List:    Assessment & Plan:    Chronic resp failure Trach/vent dependence  P -lasix  today to achieve net negative fluid balance -cont routine trach care and MV support -continue PS trials as tolerated -PRN CXR   -duoneb   Sev sepsis due to CRE klebsiella bacteremia UTI and HCAP (klebsiella and pseudomonas)  -foley exchanged on admission -Repeat Bcx2 07/13/2023: negative -Repeat Bcx 07/18/2023 per ID: negative  -picc placed 5/11 P -2wk course avycaz  -- last dose 5/16  -when completes avycaz  will see if we can get PIV and dc PICC. Isnt getting much by way of IV meds.   Encephalopathy Reported hx dementia Hx SDH c/b bleed and sz  -mini crani/evac at [facility] 10/24 c/b R cerebellar bleed, sz  P  -delirium precautions -minimize CNS depressing meds -namenda, provigil    CKD 3a, uremia  P -PRN BMP -lasix  5/17  HTN CAD s/p CABG Systolic HF Hx Afib not on AC  P -cardiac monitoring  -amlodipine  10mg   -repeat lasix  5/17 -pending response to lasix , may need to add home hydral back. W renal dysfxn would err against restarting losartan  for now   -optimize lytes PRN   AoC anemia Thrombocytopenia, mild  -chronic, critical illness -abdominal wall hematomas, mild hematuria  P -holding chemical vte ppx for now  GERD Constipation -Pepcid  -SCH bowel reg, continue miralax   DM2 w hyperglycemia  P -SSI -semglee  12 u daily  -adding EN coverage 2u q4  Severe protein calorie malnutrition  -EN per RDN   BPH -restart home cardura  5/16 -cont foley for now   GOC DNR -based on conversation with our team 5/17, continue aggressive care, but DNR  - wife desires discharge to a different LTACH   Best Practice (right click and "Reselect all SmartList Selections" daily)  Diet/type: tube feeds DVT prophylaxis SCD  Pressure ulcer(s): N/A GI prophylaxis: H2B Lines: PICC line Foley: Yes, and it is still needed Code Status: DNR Last date of multidisciplinary goals of care discussion [wife updated at bedside daily. Continue aggressive care for now]  Signature:   The patient is critically ill due to respiratory failure.  Critical care was necessary to treat or prevent imminent or life-threatening  deterioration.  Critical care was time spent personally by me on the following activities: development of treatment plan with patient and/or surrogate as well as nursing, discussions with consultants, evaluation of patient's response to treatment, examination of patient, obtaining history from patient or surrogate, ordering and performing treatments and interventions, ordering and review of laboratory studies, ordering and review of radiographic studies, pulse oximetry, re-evaluation of patient's condition and participation in multidisciplinary rounds.   Critical Care Time devoted to patient care services described in this note is 33 minutes. This time reflects time of care of this signee Lowery Paullin S Parissa Chiao . This critical care time does not reflect separately billable procedures or procedure time, teaching time or supervisory time of PA/NP/Med student/Med Resident etc but could involve care discussion time.       Aleck Hurdle [place] Pulmonary and Critical Care Medicine 07/26/2023 7:49 AM  Pager: see AMION  If no response to pager , please call critical care on call (see AMION) until  7pm After 7:00 pm call Elink

## 2023-07-26 NOTE — Plan of Care (Signed)
  Problem: Respiratory: Goal: Ability to maintain a clear airway and adequate ventilation will improve Outcome: Progressing   Problem: Activity: Goal: Ability to tolerate increased activity will improve Outcome: Not Progressing   Problem: Role Relationship: Goal: Method of communication will improve Outcome: Not Progressing

## 2023-07-26 NOTE — Progress Notes (Signed)
 Daily Progress Note   Patient Name: Albert Vance.       Date: 07/26/2023 DOB: [birth year]  Age: 78 y.o. MRN#: [id number] Attending Physician: Aleck Hurdle, MD Primary Care Physician: Jerral Moos, MD Admit Date: 07/10/2023  Reason for Consultation/Follow-up: Establishing goals of care   Length of Stay: 16  Current Medications: Scheduled Meds:   amantadine   100 mg Per Tube Daily   amLODipine   10 mg Per Tube Daily   brimonidine   1 drop Both Eyes Q8H   Chlorhexidine  Gluconate Cloth  6 each Topical Daily   docusate  100 mg Per Tube BID   doxazosin   2 mg Per Tube Daily   famotidine   20 mg Per Tube Daily   feeding supplement (PROSource TF20)  60 mL Per Tube BID   Gerhardt's butt cream   Topical BID   insulin  aspart  0-15 Units Subcutaneous Q4H   insulin  aspart  2 Units Subcutaneous Q4H   insulin  glargine-yfgn  12 Units Subcutaneous Daily   ipratropium-albuterol   3 mL Nebulization BID   modafinil   100 mg Per Tube Daily   nutrition supplement (JUVEN)  1 packet Per Tube BID BM   mouth rinse  15 mL Mouth Rinse Q2H   polyethylene glycol  17 g Per Tube BID   polyvinyl alcohol   1 drop Both Eyes Q4H   senna  2 tablet Per Tube Daily   sodium chloride  flush  10-40 mL Intracatheter Q12H   sodium chloride  HYPERTONIC  4 mL Nebulization Daily    Continuous Infusions:  feeding supplement (OSMOLITE 1.5 CAL) 45 mL/hr at 07/26/23 1400    PRN Meds: acetaminophen , clonazePAM , ipratropium-albuterol , ondansetron  (ZOFRAN ) IV, oxyCODONE , QUEtiapine , sodium chloride  flush  Physical Exam Vitals reviewed.  Constitutional:      General: He is awake. He is not in acute distress.    Appearance: He is ill-appearing.  Cardiovascular:     Rate and Rhythm: Normal rate.  Pulmonary:      Comments: ventillator            Vital Signs: BP (!) 140/69   Pulse 74   Temp 99.2 F (37.3 C) (Axillary)   Resp (!) 27   Ht 5\' 6"  (1.676 m)   Wt 77.5 kg   SpO2 98%   BMI 27.58 kg/m  SpO2:  SpO2: 98 % O2 Device: O2 Device: Ventilator O2 Flow Rate:         Palliative Assessment/Data: 30%      Patient Active Problem List   Diagnosis Date Noted   Goals of care, counseling/discussion 07/15/2023   Carbapenem-resistant bacterial infection 07/14/2023   Hypoalbuminemia 07/11/2023   Klebsiella sepsis (HCC) 07/11/2023   Hypernatremia 07/10/2023   AKI (acute kidney injury) (HCC) 07/10/2023   Ventilator dependent (HCC) 07/10/2023   Symptomatic anemia 07/10/2023   Anasarca 07/10/2023   Pressure injury of skin of left heel 07/10/2023    Palliative Care Assessment & Plan   Patient Profile: 78 y.o. 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 Found to be septic due to CRE klebsiella UTI and bacteremia with AKI.    Today's Discussion: Received updates from nursing. No significant changes. Patient is awake in NAD. No family at bedside.  Called patient's wife Isa Manuel. Isa Manuel shared she is concerned about whether the patient will be accepted to [facility]. She knows there are a limited number of facilities that accept patient's on ventilators. She shared that it was upsetting to see the patient yesterday when he had a mucous plug. She shared that yesterday the patient was alert and smiling at her. She shared that it does not appear he is in pain or suffering and she would like to continued full scope of care. I told her that there had been no changes and the patient remains full scope of care. She shared that if she felt he were suffering she might consider changes to his scope of  care.  Emotional support provided. Encouraged patient's wife to call PMT with questions or concerns.   Recommendations/Plan: DNR Full scope of care: Patient's wife open to all interventions PMT will continue to follow   Code Status:    Code Status Orders  (From admission, onward)           Start     Ordered   07/15/23 1405  Do not attempt resuscitation (DNR) Pre-Arrest Interventions Desired  (Code Status)  Continuous       Question Answer Comment  If pulseless and not breathing No CPR or chest compressions.   In Pre-Arrest Conditions (Patient Has Pulse and Is Breathing) May intubate, use advanced airway interventions and cardioversion/ACLS medications if appropriate or indicated. May transfer to ICU.   Consent: Discussion documented in EHR or advanced directives reviewed      07/15/23 1406         Extensive chart review has been completed prior to seeing the patient including labs, vital signs, imaging, progress/consult notes, orders, medications, and available advance directive documents.  Care plan was discussed with bedside RN   Time spent: 35 minutes  Thank you for allowing the Palliative Medicine Team to assist in the care of this patient.     Daina Drum, NP  Please contact Palliative Medicine Team phone at [phone] for questions and concerns.

## 2023-07-27 ENCOUNTER — Encounter (HOSPITAL_COMMUNITY): Payer: Self-pay | Admitting: Infectious Diseases

## 2023-07-27 DIAGNOSIS — E870 Hyperosmolality and hypernatremia: Secondary | ICD-10-CM | POA: Diagnosis not present

## 2023-07-27 DIAGNOSIS — Z930 Tracheostomy status: Secondary | ICD-10-CM | POA: Diagnosis not present

## 2023-07-27 DIAGNOSIS — G9341 Metabolic encephalopathy: Secondary | ICD-10-CM | POA: Diagnosis not present

## 2023-07-27 DIAGNOSIS — J9611 Chronic respiratory failure with hypoxia: Secondary | ICD-10-CM | POA: Diagnosis not present

## 2023-07-27 LAB — BASIC METABOLIC PANEL WITH GFR
Anion gap: 8 (ref 5–15)
BUN: 101 mg/dL — ABNORMAL HIGH (ref 8–23)
CO2: 27 mmol/L (ref 22–32)
Calcium: 9.1 mg/dL (ref 8.9–10.3)
Chloride: 111 mmol/L (ref 98–111)
Creatinine, Ser: 1.42 mg/dL — ABNORMAL HIGH (ref 0.61–1.24)
GFR, Estimated: 50 mL/min — ABNORMAL LOW (ref >60)
Glucose, Bld: 132 mg/dL — ABNORMAL HIGH (ref 70–99)
Potassium: 4.3 mmol/L (ref 3.5–5.1)
Sodium: 146 mmol/L — ABNORMAL HIGH (ref 135–145)

## 2023-07-27 LAB — CBC
HCT: 25.9 % — ABNORMAL LOW (ref 39.0–52.0)
Hemoglobin: 7.7 g/dL — ABNORMAL LOW (ref 13.0–17.0)
MCH: 30 pg (ref 26.0–34.0)
MCHC: 29.7 g/dL — ABNORMAL LOW (ref 30.0–36.0)
MCV: 100.8 fL — ABNORMAL HIGH (ref 80.0–100.0)
Platelets: 126 10*3/uL — ABNORMAL LOW (ref 150–400)
RBC: 2.57 MIL/uL — ABNORMAL LOW (ref 4.22–5.81)
RDW: 18.3 % — ABNORMAL HIGH (ref 11.5–15.5)
WBC: 7.3 10*3/uL (ref 4.0–10.5)
nRBC: 0 % (ref 0.0–0.2)

## 2023-07-27 LAB — GLUCOSE, CAPILLARY
Glucose-Capillary: 134 mg/dL — ABNORMAL HIGH (ref 70–99)
Glucose-Capillary: 135 mg/dL — ABNORMAL HIGH (ref 70–99)
Glucose-Capillary: 150 mg/dL — ABNORMAL HIGH (ref 70–99)
Glucose-Capillary: 158 mg/dL — ABNORMAL HIGH (ref 70–99)
Glucose-Capillary: 119 mg/dL — ABNORMAL HIGH (ref 70–99)

## 2023-07-27 LAB — MAGNESIUM: Magnesium: 2.5 mg/dL — ABNORMAL HIGH (ref 1.7–2.4)

## 2023-07-27 MED ORDER — GUAIFENESIN-DM 100-10 MG/5ML PO SYRP
5.0000 mL | ORAL_SOLUTION | ORAL | Status: DC | PRN
  Administered 2023-07-27 – 2023-07-28 (×2): 5 mL
  Filled 2023-07-27 (×3): qty 5

## 2023-07-27 MED ORDER — HYDRALAZINE HCL 10 MG PO TABS
10.0000 mg | ORAL_TABLET | Freq: Four times a day (QID) | ORAL | Status: DC | PRN
  Administered 2023-07-28: 10 mg
  Filled 2023-07-27 (×2): qty 1

## 2023-07-27 NOTE — Progress Notes (Signed)
 Spoke w pt wife Isa Manuel, discussed clinical status -- stable from yesterday, labs largely stable.  She is hopeful that we will hear something re placement this week and plans to visit tomorrow.  All questions answered   Eston Hence MSN, AGACNP-BC [facility] Pulmonary/Critical Care Medicine 07/27/2023, 1:37 PM

## 2023-07-27 NOTE — Plan of Care (Signed)
  Problem: Respiratory: Goal: Ability to maintain a clear airway and adequate ventilation will improve Outcome: Progressing   Problem: Clinical Measurements: Goal: Will remain free from infection Outcome: Progressing Goal: Diagnostic test results will improve Outcome: Progressing Goal: Respiratory complications will improve Outcome: Progressing Goal: Cardiovascular complication will be avoided Outcome: Progressing   Problem: Nutrition: Goal: Adequate nutrition will be maintained Outcome: Progressing

## 2023-07-27 NOTE — Progress Notes (Signed)
 NAME:  Carol Loftin., MRN:  [id number], DOB:  May 25, 1945, LOS: 17 ADMISSION DATE:  07/10/2023, CONSULTATION DATE:  07/10/2023 REFERRING MD:  Scarlette Currier - EDP, CHIEF COMPLAINT: Abnormal labs   History of Present Illness:  A 78 yr old male patient with hx of SDH (s/p mini crani/evac 12/2022 at [facility] c/b R cerebellar hemorrhage and seizures), multiple aspiration PNA, failed extubation, chronic resp failure requiring trach/PEG, PTSD, DM-2, HTN, CAD, GERD, ? Dementia, Afib (off NOAC), PCM, and anemia of chronic illness. He has been a resident of [facility] since 01/2023 when he was dc from [facility] for SDH/sequelae of. He presented to [facility] ED 5/1 from [facility] for further evaluation of abnormal labs -- noted to have elevated Cr and low Hgb. Foley exchanged. Given one unit of PRBC and 1 L NS 0.9% bolus.   On stable vent settings (PRVC 20/510/+5/40%) and without trach complications.  PCCM consulted for evaluation in this setting.  Pertinent Medical History:  SDH, seizures (weaned off Keppra), multiple aspiration PNA, failed extubation, chronic resp failure requiring trach/PEG, PTSD, DM-2, HTN, CAD, GERD, ? Dementia, Afib (off NOAC), PCM, and anemia of chronic illness  Significant Hospital Events: Including procedures, antibiotic start and stop dates in addition to other pertinent events   5/1 1U PRBC for hgb 6.6. PCCM consult Foley exchanged in the ED here. 5/2 Cultures positive for klebsiella with CRE. 5/8 Remains on PRVC, Wife met with palliative, DNR but continue current interventions  5/9 Remains on vent via trach, hgb dropped to 6.8 one unit PRBC ordered  5/10: restless. On TF, water  flushes 250 cc q 4 hrs, Na 144. MV: PRVC 20/510/+5/40%. Failed PSV 07/15/2023, Positive 1.5 L fluid balance in 7 days 5/11: less restless. MV: PRVC 20/510/+5/40%. Failed PSV 07/15/2023, Positive 3.3 L fluid balance in 8 days 5/12: less restless. MV: PRVC 20/510/+5/40%. Failed PSV 07/15/2023 and today due to tachypnea, Positive 3.5 L  fluid balance in 9 days. On TF 5/13: comfortable today. MV: PRVC 20/510/+5/40%. Failed PSV 07/15/2023 and 07/21/2023 due to tachypnea. Positive 3.3 L fluid balance in 10 days. On TF 5/14: hematuria, 1 unit PRBC Tx, seen by urology, on foley flushes, Lovenox  was d/c. Comfortable. MV: PRVC 20/510/+5/40%. Failed PSV 07/15/2023 and 07/21/2023 due to tachypnea. Positive 2.6 L fluid balance in 11 days. On TF 5/15  adding EN coverage insulin   5/16 complete avycaz . lasix .  5/17 palli / fam mtg. No changes in goals  5/18 no changes   Interim History / Subjective:    NAEO  Na 146 Cr stable  2.5L UOP  Hgb 7.7 plt 126    Objective:  Blood pressure 131/70, pulse 81, temperature 98.9 F (37.2 C), temperature source Axillary, resp. rate 20, height 5\' 6"  (1.676 m), weight 77.5 kg, SpO2 98%.    Vent Mode: PRVC FiO2 (%):  [40 %] 40 % Set Rate:  [15 bmp] 15 bmp Vt Set:  [510 mL] 510 mL PEEP:  [5 cmH20] 5 cmH20 Plateau Pressure:  [22 cmH20-28 cmH20] 26 cmH20   Intake/Output Summary (Last 24 hours) at 07/27/2023 1017 Last data filed at 07/27/2023 0800 Gross per 24 hour  Intake 885 ml  Output 2425 ml  Net -1540 ml   Filed Weights   07/20/23 0452 07/21/23 0429 07/22/23 0500  Weight: 79 kg 79.2 kg 77.5 kg   Physical Examination: Gen:     Chronically critically ill elderly M  HEENT:  Trach secure. Anicteric sclera  Lungs:    Mechanically ventilated  CV:  irreg. S1s2  Abd:      PEG  Ext:  Anasarca.  Skin:    pale clean dry. Scattered ecchymosis  Neuro:  His eyes open spontaneously. He is not following commands.    Resolved Hospital Problem List:    Assessment & Plan:    Chronic resp failure Trach vent dependence  PsA PNA P -has had PRN lasix  -- defer 5/18, revisit 5/19  -cont routine trach care -cont efforts at PSV. Have not been able to progress to ATC -cont pulm hygiene, CPT. Completed 3d HTS nebx   Sev sepsis due to CRE klebsiella bacteremia UTI and HCAP (klebsiella and  pseudomonas)  -foley exchanged on admission -Repeat Bcx2 07/13/2023: negative -Repeat Bcx 07/18/2023 per ID: negative  -picc placed 5/11 -completed 2wk avycaz   P -have not been successful in getting PIVs for facilitating dc PICC. Cont efforts as able  Encephalopathy Reported hx dementia Hx SDH c/b bleed and sz  -mini crani/evac at [facility] 10/24 c/b R cerebellar bleed, sz  P -delirium precautions -minimize CNS depressing meds -namenda, provigil    CKD 3a, uremia  Hypernatremia, mild  P -PRN BMP -holding lasix  5/18   HTN CAD s/p CABG Systolic HF Hx Afib not on AC  P -cardiac monitoring  -amlodipine  10mg   -PRN per tube hydral (as is home med)   AoC anemia, stable  Thrombocytopenia, mild  -chronic, critical illness -abdominal wall hematomas, mild hematuria  P -holding chemical vte ppx for now  GERD Constipation -Pepcid  [facility] bowel reg, continue miralax   DM2 w hyperglycemia  P -SSI -semglee  12 u daily  -adding EN coverage 2u q4  Severe protein calorie malnutrition  -EN per RDN   BPH -restarted home cardura  5/16 -cont foley for now, consider void trial week of 5/19 if felt appropriate    GOC DNR -based on conversation with our team 5/17, continue aggressive care, but DNR  - wife desires discharge to a different LTACH. TOC has been working on this [employer] fundraiser (right click and "Control and instrumentation engineer" daily)  Diet/type: tube feeds DVT prophylaxis SCD  Pressure ulcer(s): N/A GI prophylaxis: H2B Lines: PICC line Foley: Yes, and it is still needed Code Status: DNR Last date of multidisciplinary goals of care discussion [5/16. Continue aggressive care otherwise]  Signature:   CRITICAL CARE Performed by: Delories Fetter   Total critical care time: 36 minutes  Critical care time was exclusive of separately billable procedures and treating other patients. Critical care was necessary to treat or prevent imminent or life-threatening  deterioration.  Critical care was time spent personally by me on the following activities: development of treatment plan with patient and/or surrogate as well as nursing, discussions with consultants, evaluation of patient's response to treatment, examination of patient, obtaining history from patient or surrogate, ordering and performing treatments and interventions, ordering and review of laboratory studies, ordering and review of radiographic studies, pulse oximetry and re-evaluation of patient's condition.  Eston Hence MSN, AGACNP-BC [place] Pulmonary/Critical Care Medicine Amion for pager  07/27/2023, 10:17 AM

## 2023-07-28 DIAGNOSIS — J9621 Acute and chronic respiratory failure with hypoxia: Secondary | ICD-10-CM | POA: Diagnosis not present

## 2023-07-28 DIAGNOSIS — A4150 Gram-negative sepsis, unspecified: Secondary | ICD-10-CM

## 2023-07-28 DIAGNOSIS — E870 Hyperosmolality and hypernatremia: Secondary | ICD-10-CM | POA: Diagnosis not present

## 2023-07-28 DIAGNOSIS — Z930 Tracheostomy status: Secondary | ICD-10-CM | POA: Diagnosis not present

## 2023-07-28 DIAGNOSIS — N179 Acute kidney failure, unspecified: Secondary | ICD-10-CM | POA: Diagnosis not present

## 2023-07-28 LAB — CBC
HCT: 26.2 % — ABNORMAL LOW (ref 39.0–52.0)
Hemoglobin: 7.6 g/dL — ABNORMAL LOW (ref 13.0–17.0)
MCH: 29.9 pg (ref 26.0–34.0)
MCHC: 29 g/dL — ABNORMAL LOW (ref 30.0–36.0)
MCV: 103.1 fL — ABNORMAL HIGH (ref 80.0–100.0)
Platelets: 129 10*3/uL — ABNORMAL LOW (ref 150–400)
RBC: 2.54 MIL/uL — ABNORMAL LOW (ref 4.22–5.81)
RDW: 18.4 % — ABNORMAL HIGH (ref 11.5–15.5)
WBC: 7.3 10*3/uL (ref 4.0–10.5)
nRBC: 0 % (ref 0.0–0.2)

## 2023-07-28 LAB — GLUCOSE, CAPILLARY
Glucose-Capillary: 119 mg/dL — ABNORMAL HIGH (ref 70–99)
Glucose-Capillary: 136 mg/dL — ABNORMAL HIGH (ref 70–99)
Glucose-Capillary: 173 mg/dL — ABNORMAL HIGH (ref 70–99)
Glucose-Capillary: 199 mg/dL — ABNORMAL HIGH (ref 70–99)
Glucose-Capillary: 204 mg/dL — ABNORMAL HIGH (ref 70–99)
Glucose-Capillary: 218 mg/dL — ABNORMAL HIGH (ref 70–99)

## 2023-07-28 LAB — BASIC METABOLIC PANEL WITH GFR
Anion gap: 6 (ref 5–15)
BUN: 104 mg/dL — ABNORMAL HIGH (ref 8–23)
CO2: 28 mmol/L (ref 22–32)
Calcium: 9.1 mg/dL (ref 8.9–10.3)
Chloride: 113 mmol/L — ABNORMAL HIGH (ref 98–111)
Creatinine, Ser: 1.42 mg/dL — ABNORMAL HIGH (ref 0.61–1.24)
GFR, Estimated: 50 mL/min — ABNORMAL LOW (ref >60)
Glucose, Bld: 264 mg/dL — ABNORMAL HIGH (ref 70–99)
Potassium: 4.6 mmol/L (ref 3.5–5.1)
Sodium: 147 mmol/L — ABNORMAL HIGH (ref 135–145)

## 2023-07-28 MED ORDER — CARVEDILOL 12.5 MG PO TABS
12.5000 mg | ORAL_TABLET | Freq: Two times a day (BID) | ORAL | Status: DC
  Administered 2023-07-28: 12.5 mg
  Filled 2023-07-28: qty 1

## 2023-07-28 MED ORDER — LATANOPROST 0.005 % OP SOLN
1.0000 [drp] | Freq: Every day | OPHTHALMIC | Status: DC
  Administered 2023-07-28 – 2023-07-29 (×2): 1 [drp] via OPHTHALMIC
  Filled 2023-07-28: qty 2.5

## 2023-07-28 MED ORDER — DORZOLAMIDE HCL-TIMOLOL MAL 2-0.5 % OP SOLN
1.0000 [drp] | Freq: Two times a day (BID) | OPHTHALMIC | Status: DC
  Administered 2023-07-28 – 2023-07-30 (×5): 1 [drp] via OPHTHALMIC
  Filled 2023-07-28: qty 10

## 2023-07-28 MED ORDER — INSULIN ASPART 100 UNIT/ML IJ SOLN: 5.0000 [IU] | INTRAMUSCULAR | Status: DC

## 2023-07-28 MED ORDER — FREE WATER
200.0000 mL | Freq: Four times a day (QID) | Status: DC
Start: 2023-07-28 — End: 2023-07-29
  Administered 2023-07-28 – 2023-07-29 (×4): 200 mL

## 2023-07-28 MED ORDER — INSULIN ASPART 100 UNIT/ML IJ SOLN
3.0000 [IU] | INTRAMUSCULAR | Status: DC
  Administered 2023-07-28 – 2023-07-30 (×10): 3 [IU] via SUBCUTANEOUS

## 2023-07-28 MED ORDER — HYDRALAZINE HCL 10 MG PO TABS: 10.0000 mg | ORAL_TABLET | Freq: Four times a day (QID) | ORAL | Status: DC

## 2023-07-28 MED ORDER — HYDRALAZINE HCL 10 MG PO TABS
10.0000 mg | ORAL_TABLET | Freq: Four times a day (QID) | ORAL | Status: DC
  Administered 2023-07-28 – 2023-07-30 (×8): 10 mg
  Filled 2023-07-28 (×9): qty 1

## 2023-07-28 MED ORDER — ENOXAPARIN SODIUM 40 MG/0.4ML IJ SOSY
40.0000 mg | PREFILLED_SYRINGE | INTRAMUSCULAR | Status: DC
Start: 2023-07-28 — End: 2023-07-30
  Administered 2023-07-28 – 2023-07-30 (×3): 40 mg via SUBCUTANEOUS
  Filled 2023-07-28 (×3): qty 0.4

## 2023-07-28 MED ORDER — FREE WATER: 50.0000 mL | Status: DC

## 2023-07-28 NOTE — Progress Notes (Signed)
 PT Cancellation Note  Patient Details Name: Albert Vance. MRN: [id number] DOB: Apr 30, 1945   Cancelled Treatment:    Reason Eval/Treat Not Completed: Other (comment) (pt non-verbal, bedridden and does not follow commands. Chronic vent from [facility]. Pt unable to actively participate in therapy and not appropriate for acute services)   Gunter Conde B Chabeli Barsamian 07/28/2023, 11:34 AM Annis Baseman, PT Acute Rehabilitation Services Office: [phone]

## 2023-07-28 NOTE — Plan of Care (Signed)
  Problem: Education: Goal: Knowledge of General Education information will improve Description: Including pain rating scale, medication(s)/side effects and non-pharmacologic comfort measures Outcome: Progressing   Problem: Clinical Measurements: Goal: Will remain free from infection Outcome: Progressing Goal: Respiratory complications will improve Outcome: Progressing   Problem: Nutrition: Goal: Adequate nutrition will be maintained Outcome: Progressing   Problem: Pain Managment: Goal: General experience of comfort will improve and/or be controlled Outcome: Progressing

## 2023-07-28 NOTE — Progress Notes (Addendum)
 NAME:  Albert Beitler., MRN:  [id number], DOB:  12/20/1945, LOS: 18 ADMISSION DATE:  07/10/2023, CONSULTATION DATE:  07/10/2023 REFERRING MD:  Scarlette Currier - EDP, CHIEF COMPLAINT: Abnormal labs   History of Present Illness:  A 78 yr old male patient with hx of SDH (s/p mini crani/evac 12/2022 at [facility] c/b R cerebellar hemorrhage and seizures), multiple aspiration PNA, failed extubation, chronic resp failure requiring trach/PEG, PTSD, DM-2, HTN, CAD, GERD, ? Dementia, Afib (off NOAC), PCM, and anemia of chronic illness. He has been a resident of [facility] since 01/2023 when he was dc from [facility] for SDH/sequelae of. He presented to [facility] ED 5/1 from [facility] for further evaluation of abnormal labs -- noted to have elevated Cr and low Hgb. Foley exchanged. Given one unit of PRBC and 1 L NS 0.9% bolus.   On stable vent settings (PRVC 20/510/+5/40%) and without trach complications.  PCCM consulted for evaluation in this setting.  Pertinent Medical History:  SDH, seizures (weaned off Keppra), multiple aspiration PNA, failed extubation, chronic resp failure requiring trach/PEG, PTSD, DM-2, HTN, CAD, GERD, ? Dementia, Afib (off NOAC), PCM, and anemia of chronic illness  Significant Hospital Events: Including procedures, antibiotic start and stop dates in addition to other pertinent events   5/1 1U PRBC for hgb 6.6. PCCM consult Foley exchanged in the ED here. 5/2 Cultures positive for klebsiella with CRE. 5/8 Remains on PRVC, Wife met with palliative, DNR but continue current interventions  5/9 Remains on vent via trach, hgb dropped to 6.8 one unit PRBC ordered  5/10: restless. On TF, water  flushes 250 cc q 4 hrs, Na 144. MV: PRVC 20/510/+5/40%. Failed PSV 07/15/2023, Positive 1.5 L fluid balance in 7 days 5/11: less restless. MV: PRVC 20/510/+5/40%. Failed PSV 07/15/2023, Positive 3.3 L fluid balance in 8 days 5/12: less restless. MV: PRVC 20/510/+5/40%. Failed PSV 07/15/2023 and today due to tachypnea, Positive 3.5 L  fluid balance in 9 days. On TF 5/13: comfortable today. MV: PRVC 20/510/+5/40%. Failed PSV 07/15/2023 and 07/21/2023 due to tachypnea. Positive 3.3 L fluid balance in 10 days. On TF 5/14: hematuria, 1 unit PRBC Tx, seen by urology, on foley flushes, Lovenox  was d/c. Comfortable. MV: PRVC 20/510/+5/40%. Failed PSV 07/15/2023 and 07/21/2023 due to tachypnea. Positive 2.6 L fluid balance in 11 days. On TF 5/15  adding EN coverage insulin   5/16 complete avycaz . lasix .  5/17 palli / fam mtg. No changes in goals  5/18 no changes   Interim History / Subjective:   No lasix  yesterday; UOP 1.7 L last 24 hours; creat stable 1.42 On PRVC  Having some thick tracheal secretions; wbc stable and afebrile   Objective:  Blood pressure (!) 165/78, pulse 84, temperature 99.4 F (37.4 C), temperature source Axillary, resp. rate 20, height 5\' 6"  (1.676 m), weight 77.5 kg, SpO2 98%.    Vent Mode: PRVC FiO2 (%):  [40 %] 40 % Set Rate:  [15 bmp] 15 bmp Vt Set:  [510 mL] 510 mL PEEP:  [5 cmH20] 5 cmH20 Plateau Pressure:  [24 cmH20-25 cmH20] 24 cmH20   Intake/Output Summary (Last 24 hours) at 07/28/2023 0745 Last data filed at 07/28/2023 0600 Gross per 24 hour  Intake 1265 ml  Output 1790 ml  Net -525 ml   Filed Weights   07/20/23 0452 07/21/23 0429 07/22/23 0500  Weight: 79 kg 79.2 kg 77.5 kg   Physical Examination: General:  chronically ill appearing on mech vent HEENT: MM pink/moist; ETT in place Neuro: eyes open; cough/gag  present; move ext but not on command CV: s1s2, no m/r/g PULM:  dim clear BS bilaterally; trached on mech vent PRVC GI: soft, bsx4 active  Extremities: warm/dry   Resolved Hospital Problem List:    Assessment & Plan:    Chronic resp failure Trach vent dependence  PsA PNA P: -LTVV strategy with tidal volumes of 6-8 cc/kg ideal body weight -Wean PEEP/FiO2 for SpO2 >92% -VAP bundle in place -trach care per protocol -pulm toiletry: completed course of HTS nebs; spoke with  RT and considering switching vest cpt to metaneb if secretions remain thick -lavage/tracheal suction as needed  -completed avycaz  on 5/15  Sev sepsis due to CRE klebsiella bacteremia UTI and HCAP (klebsiella and pseudomonas)  -foley exchanged on admission -Repeat Bcx2 07/13/2023: negative -Repeat Bcx 07/18/2023 per ID: negative  -picc placed 5/11 -completed 2wk avycaz   P: -completed avycaz  5/15 -trend wbc/fever curve  Encephalopathy Reported hx dementia Hx SDH c/b bleed and sz  -mini crani/evac at [facility] 10/24 c/b R cerebellar bleed, sz  P: -limit sedating meds -delirium precautions -pt/ot -namenda, provigil   CKD 3a, uremia  Hypernatremia, mild  P: -lasix  held on 5/18; UOP 1.7 L -Trend BMP / urinary output -Replace electrolytes as indicated -Avoid nephrotoxic agents, ensure adequate renal perfusion  HTN CAD s/p CABG Systolic HF Hx Afib not on AC  P: -on norvasc ; sbp 130-160 -add carvedilol  -prn hydralazine   AoC anemia, stable  Thrombocytopenia, mild  -chronic, critical illness -abdominal wall hematomas, mild hematuria  P: -will start on dvt ppx today -trend cbc  GERD Constipation P: -Pepcid  -having bowel mvts per nurse; cont bowel regimen  DM2 w hyperglycemia  P: -cont SSI and cbg monitoring -semglee  12 u daily  -increase EN coverage 5u q4  Severe protein calorie malnutrition  P: -EN per RDN   BPH P: -cardura  -cont foley for now  GOC DNR -based on conversation with our team 5/17, continue aggressive care, but DNR  - wife desires discharge to a different LTACH. TOC has been working on this Charity fundraiser (right click and "Control and instrumentation engineer" daily)  Diet/type: tube feeds DVT prophylaxis SCD  Pressure ulcer(s): N/A GI prophylaxis: H2B Lines: PICC line Foley: Yes, and it is still needed Code Status: DNR Last date of multidisciplinary goals of care discussion [5/19 updated wife Isa Manuel over phone]  Signature:    CRITICAL CARE Performed by: Casimiro Cleaves   Total critical care time: 35 minutes  JD [facility] 07/28/2023, 8:12 AM  Please see [email or web address] for pager details.  From 7A-7P if no response, please call [phone]. After hours, please call ELink [phone].

## 2023-07-28 NOTE — Plan of Care (Signed)
  Problem: Activity: Goal: Ability to tolerate increased activity will improve Outcome: Progressing   Problem: Respiratory: Goal: Ability to maintain a clear airway and adequate ventilation will improve Outcome: Progressing   Problem: Role Relationship: Goal: Method of communication will improve Outcome: Progressing   Problem: Education: Goal: Knowledge of General Education information will improve Description: Including pain rating scale, medication(s)/side effects and non-pharmacologic comfort measures Outcome: Progressing   Problem: Clinical Measurements: Goal: Will remain free from infection Outcome: Progressing Goal: Diagnostic test results will improve Outcome: Progressing Goal: Respiratory complications will improve Outcome: Progressing Goal: Cardiovascular complication will be avoided Outcome: Progressing   Problem: Activity: Goal: Risk for activity intolerance will decrease Outcome: Progressing   Problem: Nutrition: Goal: Adequate nutrition will be maintained Outcome: Progressing   Problem: Coping: Goal: Level of anxiety will decrease Outcome: Progressing   Problem: Elimination: Goal: Will not experience complications related to bowel motility Outcome: Progressing Goal: Will not experience complications related to urinary retention Outcome: Progressing   Problem: Pain Managment: Goal: General experience of comfort will improve and/or be controlled Outcome: Progressing   Problem: Safety: Goal: Ability to remain free from injury will improve Outcome: Progressing   Problem: Skin Integrity: Goal: Risk for impaired skin integrity will decrease Outcome: Progressing

## 2023-07-28 NOTE — TOC Progression Note (Signed)
 Transition of Care (TOC) - Progression Note    Patient Details  Name: Albert Vance. MRN: [id number] Date of Birth: 1945/09/09  Transition of Care [facility] CM/SW Contact  Katrinka Parr, [place] Phone Number: 07/28/2023, 3:09 PM  Clinical Narrative:     Received call Tiffany at [facility]. Informed that they have a SNF bed for pt and can clinically accept him. [facility] scheduled for [employer] on Wednesday, 5/21. CSW called and updated pt's spouse. CSW notified provider and nurse via secure chat.   Expected Discharge Plan: Skilled Nursing Facility Barriers to Discharge: Vent Bed not available  Expected Discharge Plan and Services     Post Acute Care Choice: Skilled Nursing Facility Living arrangements for the past 2 months:  (admitted from [facility])                                       Social Determinants of Health (SDOH) Interventions SDOH Screenings   Food Insecurity: No Food Insecurity (07/14/2023)  Housing: Low Risk  (07/14/2023)  Transportation Needs: Patient Unable To Answer (07/14/2023)  Utilities: Not At Risk (07/14/2023)  Social Connections: Patient Unable To Answer (07/14/2023)  Tobacco Use: Medium Risk (07/19/2023)    Readmission Risk Interventions     No data to display

## 2023-07-28 NOTE — Discharge Summary (Addendum)
 Physician Discharge Summary     Patient ID: Albert Vance. MRN: [id number] DOB/AGE: [birth year] 78 y.o.  Admission Date: 07/10/2023 Discharge Date: 07/30/2023  Discharge Diagnoses:    Active Hospital Problems   Diagnosis Date Noted   Hypernatremia 07/10/2023   Malnutrition of moderate degree 07/29/2023   Goals of care, counseling/discussion 07/15/2023   Carbapenem-resistant bacterial infection 07/14/2023   Hypoalbuminemia 07/11/2023   Klebsiella sepsis (HCC) 07/11/2023   AKI (acute kidney injury) (HCC) 07/10/2023   Ventilator dependent (HCC) 07/10/2023   Symptomatic anemia 07/10/2023   Anasarca 07/10/2023   Pressure injury of skin of left heel 07/10/2023    Resolved Hospital Problems  No resolved problems to display.      Discharge Summary   Albert Vance is a 78 year old man with hx of SDH (s/p mini crani/evac 12/2022 at [facility] c/b R cerebellar hemorrhage and seizures), multiple episodes of aspiration PNA with failed extubations, chronic respiratory failure ultimately requiring trach/PEG, PTSD, DM-2, HTN, CAD, Afib (not on AC), GERD, ?Dementia, PCM, and anemia of chronic illness. He has been a resident of [facility] since 01/2023 when he was discharged from [facility] for SDH/sequelae of SDH.  Patient presented to [facility] ED 5/1 from [facility] for further evaluation of abnormal labs. He was noted to have elevated Cr and low Hgb. Foley was exchanged and patient was given one unit of PRBC and 1L NS 0.9% bolus. At the time of admission, patient was on stable vent settings (PRVC 20/510/+5/40%) without tracheostomy complications. PCCM consulted for evaluation in this setting. On 5/2, Cx positive for klebsiella w/ CRE secondary to UTI. Trach cultures growing klebsiella and pseudomonas. Started on Avycaz . Since admission, patient has had intermittent drop in Hgb (suspected 2/2 polyphlemotomy/ACD) requiring transfusions. Completed Avycaz  course 5/16.  Given clinical stability, patient was deemed medically  appropriate for discharge on 5/21. Family requested different placement for LTACH with plan for discharge to [facility], who will accept patient 5/21AM.  Discharge Plan by Active Problems    Chronic resp failure Trach vent dependence  - Continue home vent settings - May wean PEEP/FiO2 for SpO2 >92% - VAP bundle in place - ATC vs. PSV trials when able, though suspect at least some degree of ventilator dependence is likely permanent - Trach care per protocol - Pulmonary hygiene  - Tracheal suction as needed    Sev sepsis due to CRE klebsiella bacteremia UTI and HCAP (klebsiella and pseudomonas)  Foley exchanged on admission. Repeat BCx 5/4 negative; repeat BCx 5/9 negative. PICC placed 5/11. S/p Avycaz  x 14-day course (end 5/16). - Trend WBC, fever curve  Encephalopathy Reported Hx dementia Hx SDH c/b bleed and seizures S/p mini crani/evac at [facility] 10/24 c/b R cerebellar bleed, seizures. - Limit sedating meds - Delirium precautions - Avoid interruptions to normal sleep/wake cycle as able - Continue Namenda, Provigil  to promote wakefulness - PT/OT/SLP as indicated   CKD 3a, uremia  Hypernatremia, mild  - Trend BMP - Replete electrolytes as indicated - Monitor I&Os - Avoid nephrotoxic agents as able - Ensure adequate renal perfusion   HTN CAD s/p CABG Systolic HF Hx Afib not on AC  Bradycardic on 5/19 after starting carvedilol . - Continue Norvasc , hydralazine    AoC anemia, stable  Thrombocytopenia, mild  Anemia of chronic disease/critical illness. Noted stable abdominal wall hematomas, mild hematuria. - Trend H&H, Plt - Lovenox  started 5/19 - Monitor for signs of active bleeding - Transfuse for Hgb < 7.0 or hemodynamically significant bleeding  GERD Constipation - Pepcid  - Bowel  regimen   DM2 w hyperglycemia  - Basal Semglee  12U daily - TF coverage Novolog  3U Q4H - SSI - CBGs Q4H - Goal CBG 140-180   Severe protein calorie malnutrition  - Continue TF via  PEG/enteral nutrition   BPH - Continue Cardura   Significant Hospital Tests/Studies:  CXR 5/1 IMPRESSION: 1. Cardiomegaly. Mixed interstitial alveolar opacities throughout the lungs, similar to slightly worse than the prior study, worrisome for worsening pulmonary edema. Small bilateral pleural effusions. 2. More dense airspace consolidation in the left lower lobe, either from asymmetric edema, atelectasis, or superimposed pneumonia.  US  Renal 5/1 IMPRESSION: Unremarkable renal ultrasound.  Echo 5/2 IMPRESSIONS  1. Left ventricular ejection fraction, by estimation, is 50%. The left ventricle has mildly decreased function. The left ventricle demonstrates global hypokinesis. There is mild concentric left ventricular hypertrophy. Left ventricular diastolic parameters are indeterminate.   2. Right ventricular systolic function is mildly reduced. The right ventricular size is mildly enlarged. Tricuspid regurgitation signal is inadequate for assessing PA pressure.   3. Left atrial size was moderately dilated.   4. Right atrial size was mildly dilated.   5. The mitral valve is normal in structure. Mild mitral valve regurgitation. No evidence of mitral stenosis. Moderate mitral annular calcification.   6. The aortic valve is tricuspid. There is mild calcification of the  aortic valve. Aortic valve regurgitation is not visualized. No aortic stenosis is present.   7. Aortic dilatation noted. There is mild dilatation of the ascending aorta, measuring 39 mm.   8. The inferior vena cava is dilated in size with <50% respiratory variability, suggesting right atrial pressure of 15 mmHg.   9. Trivial pericardial effusion. Pleural effusion noted.  10. The patient was in atrial fibrillation.   CXR 5/3 IMPRESSION: 1. Rotated portable chest. Chronic cardiomegaly with CABG. 2. Widespread, coarse bilateral perihilar and lung base opacity, similar since radiographs last year. This may be acute  pulmonary edema superimposed on chronic lung disease. Infection could not be excluded.  CXR 5/7 IMPRESSION: 1. Tracheostomy tube projects over the tracheal air column. 2. Moderate enlargement of the cardiopericardial silhouette with indistinct pulmonary vasculature and perihilar airspace opacities suspicious for pulmonary edema. 3. Stable retrocardiac airspace opacity which could be from atelectasis or pneumonia. 4. Blunting of the right lateral costophrenic angle suggesting right pleural effusion.  XR Abd 5/8 IMPRESSION: Nonspecific bowel gas pattern.  No substantial stool volume.  CXR 5/8 XR Abd 5/8 IMPRESSION: 1. Enteric tube with tip projecting over the left lung base presumably in the region of the gastric fundus. 2. Similar or slight progression of bilateral pulmonary opacities. 3. Nonspecific bowel gas pattern.   CXR 5/11 IMPRESSION: 1. Right approach PICC line tip is at the cavoatrial junction. 2. Persistent retrocardiac airspace opacities with possible mild progression on the right.  CXR 5/16 IMPRESSION: Stable bilateral lung opacities are noted concerning for multifocal pneumonia or edema with small pleural effusions. Stable tracheostomy.  Procedures:   5/8 - Midline IV placement 5/11 - PICC placement (R brachial)  Culture Data/Antimicrobials:  5/1 - BCx +GNRs > enterobacterales > klebsiella pneumoniae, CRE KPC gene 5/3 - Resp Cx > abundant GNRs > moderate PsA, moderate klebsiella pneumoniae 5/4 - BCx NG 5/8 - BCx NG 5/9 - BCx NG  Zosyn  5/1 Vanc 5/1 Avycaz  5/2 > 5/16  Consults:  IMTS (initially primary team), PCCM, ID, PMT   Discharge Exam: BP (!) 165/67   Pulse 67   Temp 98.4 F (36.9 C) (Axillary)   Resp 13  Ht 5\' 6"  (1.676 m)   Wt 79.3 kg   SpO2 97%   BMI 28.22 kg/m   Physical Examination: General: Chronically ill-appearing elderly man in NAD. HEENT: [place]/AT, anicteric sclera, PERRL 3mm, dry mucous membranes. Neuro: Awake, unable to  assess orientation. Tracks with verbal stimuli. Not reliably commands. Moves BUE spontaneously. Generalized weakness. +Corneal, +Cough, and +Gag  CV: Irregularly irregular rhythm, rate 60s, no m/g/r. PULM: Breathing even and unlabored on vent (PEEP 5, FiO2 40%). Lung fields diminshed at bases bilaterally, clear in upper fields. GI: Soft, nontender, mildly distended. Hypoactive bowel sounds. Extremities: Bilateral symmetric 1-2+ pitting LE edema noted to ankle. Bilateral TMA noted. Skin: Warm/dry, scattered ecchymosis to BUE. +Anasarca.  Labs at Discharge:   Lab Results  Component Value Date   CREATININE 1.34 (H) 07/30/2023   BUN 113 (H) 07/30/2023   NA 150 (H) 07/30/2023   K 4.5 07/30/2023   CL 113 (H) 07/30/2023   CO2 29 07/30/2023   Lab Results  Component Value Date   WBC 10.4 07/29/2023   HGB 7.5 (L) 07/29/2023   HCT 25.9 (L) 07/29/2023   MCV 103.6 (H) 07/29/2023   PLT 128 (L) 07/29/2023   Lab Results  Component Value Date   ALT 23 07/10/2023   AST 18 07/10/2023   ALKPHOS 231 (H) 07/10/2023   BILITOT 0.8 07/18/2023   No results found for: "INR", "PROTIME"  Disposition:    Discharge disposition: 03-Skilled Nursing Facility      Discharge Instructions     Call MD for:  difficulty breathing, headache or visual disturbances   Complete by: As directed    Call MD for:  extreme fatigue   Complete by: As directed    Call MD for:  hives   Complete by: As directed    Call MD for:  persistant dizziness or light-headedness   Complete by: As directed    Call MD for:  persistant nausea and vomiting   Complete by: As directed    Call MD for:  redness, tenderness, or signs of infection (pain, swelling, redness, odor or green/yellow discharge around incision site)   Complete by: As directed    Call MD for:  severe uncontrolled pain   Complete by: As directed    Call MD for:  temperature >100.4   Complete by: As directed    Discharge instructions   Complete by: As  directed    Discharge medication list to be reviewed by receiving facility with changes to be made at their provider's discretion. All updated/current medications are included in patient's AVS/Discharge Summary.   Discharge wound care:   Complete by: As directed    1) Paint L heel with betadine daily; allow to air dry, offloading L heel. 2) Cleanse L plantar wound and sacral wounds with saline, pat dry; cut silver hydrofiber (Aquacel Ag+) to each wound and top with foam; changed every other day and as needed.   Increase activity slowly   Complete by: As directed       Allergies as of 07/30/2023       Reactions   Gabapentin Other (See Comments)   Allergy not listed on MAR   Lisinopril Other (See Comments)   Unknown         Medication List     STOP taking these medications    gabapentin 100 MG capsule Commonly known as: NEURONTIN   heparin 5000 UNIT/ML injection   insulin  lispro 100 UNIT/ML injection Commonly known as: HUMALOG   lansoprazole  30 MG capsule Commonly known as: PREVACID       TAKE these medications    acetaminophen  325 MG tablet Commonly known as: TYLENOL  Place 650 mg into feeding tube every 6 (six) hours as needed for mild pain (pain score 1-3), moderate pain (pain score 4-6) or fever.   amantadine  100 MG capsule Commonly known as: SYMMETREL  Place 100 mg into feeding tube daily.   amLODipine  10 MG tablet Commonly known as: NORVASC  Place 10 mg into feeding tube daily.   BD PosiFlush 0.9 % Soln injection Generic drug: sodium chloride  flush Inject 10 mLs into the vein See admin instructions. 10 ml intravenous by shift   brimonidine  0.2 % ophthalmic solution Commonly known as: ALPHAGAN  Place 1 drop into both eyes every 8 (eight) hours.   Carboxymethylcellulose Sod PF 0.5 % Soln Place 1 drop into both eyes every 4 (four) hours.   clonazePAM  0.5 MG tablet Commonly known as: KLONOPIN  Place 0.5 mg into feeding tube every 8 (eight) hours as  needed for anxiety.   docusate 50 MG/5ML liquid Commonly known as: COLACE Place 10 mLs (100 mg total) into feeding tube 2 (two) times daily.   dorzolamide -timolol  2-0.5 % ophthalmic solution Commonly known as: COSOPT  Place 1 drop into both eyes 2 (two) times daily.   doxazosin  2 MG tablet Commonly known as: CARDURA  Place 2 mg into feeding tube at bedtime.   enoxaparin  40 MG/0.4ML injection Commonly known as: LOVENOX  Inject 0.4 mLs (40 mg total) into the skin daily.   famotidine  20 MG tablet Commonly known as: PEPCID  Place 1 tablet (20 mg total) into feeding tube daily.   feeding supplement (OSMOLITE 1.5 CAL) Liqd Place 1,000 mLs into feeding tube continuous. What changed: how much to take   nutrition supplement (JUVEN) Pack Place 1 packet into feeding tube 2 (two) times daily between meals. What changed: You were already taking a medication with the same name, and this prescription was added. Make sure you understand how and when to take each.   feeding supplement (PROSource TF20) liquid Place 60 mLs into feeding tube 2 (two) times daily.   Fish Oil 1000 MG Caps Place 1,000 mg into feeding tube daily.   free water  Soln Place 100 mLs into feeding tube every hour.   Gerhardt's butt cream Crea Apply 1 Application topically 2 (two) times daily.   Glucagon Emergency 1 MG Kit Inject 1 mg into the muscle as needed (low BS).   guaiFENesin -dextromethorphan  100-10 MG/5ML syrup Commonly known as: ROBITUSSIN DM Place 5 mLs into feeding tube every 4 (four) hours as needed for cough.   Gvoke HypoPen 1-Pack 1 MG/0.2ML Soaj Generic drug: Glucagon Inject 1 mg into the skin as needed (low bs).   hydrALAZINE  10 MG tablet Commonly known as: APRESOLINE  Place 10 mg into feeding tube every 6 (six) hours as needed (high bp).   insulin  aspart 100 UNIT/ML injection Commonly known as: novoLOG  Inject 0-15 Units into the skin every 4 (four) hours.   insulin  aspart 100 UNIT/ML  injection Commonly known as: novoLOG  Inject 3 Units into the skin every 4 (four) hours.   insulin  glargine 100 UNIT/ML injection Commonly known as: LANTUS  Inject 0.12 mLs (12 Units total) into the skin daily. What changed:  how much to take when to take this   ipratropium-albuterol  0.5-2.5 (3) MG/3ML Soln Commonly known as: DUONEB Take 3 mLs by nebulization every 6 (six) hours. PRN order: 3 ml per nebulization every 4 hours as needed for wheezing and SOB  latanoprost  0.005 % ophthalmic solution Commonly known as: XALATAN  Place 1 drop into both eyes at bedtime.   losartan  50 MG tablet Commonly known as: COZAAR  Place 50 mg into feeding tube in the morning and at bedtime.   melatonin 3 MG Tabs tablet Place 6 mg into feeding tube at bedtime as needed (sleep).   modafinil  100 MG tablet Commonly known as: PROVIGIL  Place 100 mg into feeding tube daily.   mouth rinse Liqd solution 15 mLs by Mouth Rinse route every 2 (two) hours.   ondansetron  4 MG tablet Commonly known as: ZOFRAN  Place 4 mg into feeding tube every 6 (six) hours as needed for nausea or vomiting.   OVER THE COUNTER MEDICATION Take 1 spray by mouth every 2 (two) hours. Biotene moisturizing mouth spray   oxyCODONE  5 MG immediate release tablet Commonly known as: Oxy IR/ROXICODONE  Place 2.5 mg into feeding tube every 6 (six) hours as needed for moderate pain (pain score 4-6) or severe pain (pain score 7-10).   polyethylene glycol 17 g packet Commonly known as: MIRALAX  / GLYCOLAX  Place 17 g into feeding tube 2 (two) times daily.   polyvinyl alcohol  1.4 % ophthalmic solution Commonly known as: LIQUIFILM TEARS Place 1 drop into both eyes every 4 (four) hours.   QUEtiapine  25 MG tablet Commonly known as: SEROQUEL  Place 1 tablet (25 mg total) into feeding tube every 8 (eight) hours as needed (agitation).   Rhopressa 0.02 % Soln Generic drug: Netarsudil Dimesylate Place 1 drop into both eyes at bedtime.    senna 8.6 MG Tabs tablet Commonly known as: SENOKOT Place 2 tablets (17.2 mg total) into feeding tube daily.   simethicone 80 MG chewable tablet Commonly known as: MYLICON Place 1 tablet (80 mg total) into feeding tube 4 (four) times daily as needed for flatulence. What changed:  when to take this reasons to take this   sodium chloride  irrigation 0.9 % irrigation 10 mLs as needed (reason not listed for flush).               Discharge Care Instructions  (From admission, onward)           Start     Ordered   07/30/23 0000  Discharge wound care:       Comments: 1) Paint L heel with betadine daily; allow to air dry, offloading L heel. 2) Cleanse L plantar wound and sacral wounds with saline, pat dry; cut silver hydrofiber (Aquacel Ag+) to each wound and top with foam; changed every other day and as needed.   07/30/23 0906            Follow-up Appointment:  F/u with [facility] per protocol  Discharge Condition:   Stable  48 minutes of time have been dedicated to discharge assessment, planning and discharge instructions.   Signed: Star East 07/30/2023, 9:28 AM

## 2023-07-28 NOTE — Progress Notes (Addendum)
 OT Screen Note  Patient Details Name: Albert Vance. MRN: [id number] DOB: [birth year]   Cancelled Treatment:    Reason Eval/Treat Not Completed: OT screened, no needs identified, will sign off Per PT note, "Patient is nonverbal, bedridden, does not follow commands,and on a chronic vent from [facility]." Patient does not require acute OT services, and therefore will sign off at this time.   Albert Vance, OTR/L Acute Rehabilitation Services [phone]   Albert Vance 07/28/2023, 12:01 PM

## 2023-07-29 DIAGNOSIS — N179 Acute kidney failure, unspecified: Secondary | ICD-10-CM | POA: Diagnosis not present

## 2023-07-29 DIAGNOSIS — J9621 Acute and chronic respiratory failure with hypoxia: Secondary | ICD-10-CM | POA: Diagnosis not present

## 2023-07-29 DIAGNOSIS — E870 Hyperosmolality and hypernatremia: Secondary | ICD-10-CM | POA: Diagnosis not present

## 2023-07-29 DIAGNOSIS — Z930 Tracheostomy status: Secondary | ICD-10-CM | POA: Diagnosis not present

## 2023-07-29 DIAGNOSIS — E440 Moderate protein-calorie malnutrition: Secondary | ICD-10-CM | POA: Insufficient documentation

## 2023-07-29 LAB — BASIC METABOLIC PANEL WITH GFR
Anion gap: 7 (ref 5–15)
BUN: 108 mg/dL — ABNORMAL HIGH (ref 8–23)
CO2: 30 mmol/L (ref 22–32)
Calcium: 9.2 mg/dL (ref 8.9–10.3)
Chloride: 115 mmol/L — ABNORMAL HIGH (ref 98–111)
Creatinine, Ser: 1.41 mg/dL — ABNORMAL HIGH (ref 0.61–1.24)
GFR, Estimated: 51 mL/min — ABNORMAL LOW (ref >60)
Glucose, Bld: 173 mg/dL — ABNORMAL HIGH (ref 70–99)
Potassium: 4.6 mmol/L (ref 3.5–5.1)
Sodium: 152 mmol/L — ABNORMAL HIGH (ref 135–145)

## 2023-07-29 LAB — GLUCOSE, CAPILLARY
Glucose-Capillary: 115 mg/dL — ABNORMAL HIGH (ref 70–99)
Glucose-Capillary: 140 mg/dL — ABNORMAL HIGH (ref 70–99)
Glucose-Capillary: 172 mg/dL — ABNORMAL HIGH (ref 70–99)
Glucose-Capillary: 185 mg/dL — ABNORMAL HIGH (ref 70–99)
Glucose-Capillary: 195 mg/dL — ABNORMAL HIGH (ref 70–99)
Glucose-Capillary: 209 mg/dL — ABNORMAL HIGH (ref 70–99)
Glucose-Capillary: 211 mg/dL — ABNORMAL HIGH (ref 70–99)

## 2023-07-29 LAB — CBC WITH DIFFERENTIAL/PLATELET
Abs Immature Granulocytes: 0.04 10*3/uL (ref 0.00–0.07)
Basophils Absolute: 0.1 10*3/uL (ref 0.0–0.1)
Basophils Relative: 1 %
Eosinophils Absolute: 0.2 10*3/uL (ref 0.0–0.5)
Eosinophils Relative: 2 %
HCT: 25.9 % — ABNORMAL LOW (ref 39.0–52.0)
Hemoglobin: 7.5 g/dL — ABNORMAL LOW (ref 13.0–17.0)
Immature Granulocytes: 0 %
Lymphocytes Relative: 3 %
Lymphs Abs: 0.4 10*3/uL — ABNORMAL LOW (ref 0.7–4.0)
MCH: 30 pg (ref 26.0–34.0)
MCHC: 29 g/dL — ABNORMAL LOW (ref 30.0–36.0)
MCV: 103.6 fL — ABNORMAL HIGH (ref 80.0–100.0)
Monocytes Absolute: 0.7 10*3/uL (ref 0.1–1.0)
Monocytes Relative: 7 %
Neutro Abs: 9.1 10*3/uL — ABNORMAL HIGH (ref 1.7–7.7)
Neutrophils Relative %: 87 %
Platelets: 128 10*3/uL — ABNORMAL LOW (ref 150–400)
RBC: 2.5 MIL/uL — ABNORMAL LOW (ref 4.22–5.81)
RDW: 18.5 % — ABNORMAL HIGH (ref 11.5–15.5)
WBC: 10.4 10*3/uL (ref 4.0–10.5)
nRBC: 0 % (ref 0.0–0.2)

## 2023-07-29 MED ORDER — FREE WATER
100.0000 mL | Status: DC
  Administered 2023-07-29 – 2023-07-30 (×22): 100 mL

## 2023-07-29 MED ORDER — DEXTROSE 5 % IV BOLUS
250.0000 mL | Freq: Once | INTRAVENOUS | Status: AC
  Administered 2023-07-29: 250 mL via INTRAVENOUS

## 2023-07-29 MED ORDER — FREE WATER
200.0000 mL | Status: DC
  Administered 2023-07-29: 200 mL

## 2023-07-29 MED ORDER — HYDRALAZINE HCL 20 MG/ML IJ SOLN: 10.0000 mg | INTRAMUSCULAR | Status: DC | PRN

## 2023-07-29 NOTE — Progress Notes (Signed)
 VAST consulted to obtain IV access in order for PICC to be removed. Pt currently has PRN IV meds ordered, but none have not been given since 5/8. Educated Catering manager via SecureCHat that per hospital policy we do not place "just in case" IVs to allow for vein preservation and reduce the risk of infection from unneccessary invasive procedures. VAST RN suggested PICC remain in place if access is needed. Dr. Marygrace Snellen verbalized agreement via SecureChat to leave PICC in place until discharge.

## 2023-07-29 NOTE — Progress Notes (Addendum)
 NAME:  Albert Vance., MRN:  [id number], DOB:  05/26/1945, LOS: 19 ADMISSION DATE:  07/10/2023, CONSULTATION DATE:  07/10/2023 REFERRING MD:  Scarlette Currier - EDP, CHIEF COMPLAINT: Abnormal labs   History of Present Illness:  A 78 yr old male patient with hx of SDH (s/p mini crani/evac 12/2022 at [facility] c/b R cerebellar hemorrhage and seizures), multiple aspiration PNA, failed extubation, chronic resp failure requiring trach/PEG, PTSD, DM-2, HTN, CAD, GERD, ? Dementia, Afib (off NOAC), PCM, and anemia of chronic illness. He has been a resident of [facility] since 01/2023 when he was dc from [facility] for SDH/sequelae of. He presented to [facility] ED 5/1 from [facility] for further evaluation of abnormal labs -- noted to have elevated Cr and low Hgb. Foley exchanged. Given one unit of PRBC and 1 L NS 0.9% bolus.   On stable vent settings (PRVC 20/510/+5/40%) and without trach complications.  PCCM consulted for evaluation in this setting.  Pertinent Medical History:  SDH, seizures (weaned off Keppra), multiple aspiration PNA, failed extubation, chronic resp failure requiring trach/PEG, PTSD, DM-2, HTN, CAD, GERD, ? Dementia, Afib (off NOAC), PCM, and anemia of chronic illness  Significant Hospital Events: Including procedures, antibiotic start and stop dates in addition to other pertinent events   5/1 1U PRBC for hgb 6.6. PCCM consult Foley exchanged in the ED here. 5/2 Cultures positive for klebsiella with CRE. 5/8 Remains on PRVC, Wife met with palliative, DNR but continue current interventions  5/9 Remains on vent via trach, hgb dropped to 6.8 one unit PRBC ordered  5/10: restless. On TF, water  flushes 250 cc q 4 hrs, Na 144. MV: PRVC 20/510/+5/40%. Failed PSV 07/15/2023, Positive 1.5 L fluid balance in 7 days 5/11: less restless. MV: PRVC 20/510/+5/40%. Failed PSV 07/15/2023, Positive 3.3 L fluid balance in 8 days 5/12: less restless. MV: PRVC 20/510/+5/40%. Failed PSV 07/15/2023 and today due to tachypnea, Positive 3.5 L  fluid balance in 9 days. On TF 5/13: comfortable today. MV: PRVC 20/510/+5/40%. Failed PSV 07/15/2023 and 07/21/2023 due to tachypnea. Positive 3.3 L fluid balance in 10 days. On TF 5/14: hematuria, 1 unit PRBC Tx, seen by urology, on foley flushes, Lovenox  was d/c. Comfortable. MV: PRVC 20/510/+5/40%. Failed PSV 07/15/2023 and 07/21/2023 due to tachypnea. Positive 2.6 L fluid balance in 11 days. On TF 5/15  adding EN coverage insulin   5/16 complete avycaz . lasix .  5/17 palli / fam mtg. No changes in goals  5/18 no changes   Interim History / Subjective:  Bed available at [facility]; plan to discharge there on 5/21 On PRVC Tracheal secretions some improved   Objective:  Blood pressure (!) 121/58, pulse 73, temperature 98.3 F (36.8 C), temperature source Oral, resp. rate (!) 25, height 5\' 6"  (1.676 m), weight 78 kg, SpO2 99%.    Vent Mode: PRVC FiO2 (%):  [40 %] 40 % Set Rate:  [15 bmp-16 bmp] 15 bmp Vt Set:  [510 mL] 510 mL PEEP:  [5 cmH20] 5 cmH20 Plateau Pressure:  [22 cmH20-29 cmH20] 26 cmH20   Intake/Output Summary (Last 24 hours) at 07/29/2023 0825 Last data filed at 07/29/2023 0813 Gross per 24 hour  Intake 1899.75 ml  Output 2100 ml  Net -200.25 ml   Filed Weights   07/21/23 0429 07/22/23 0500 07/29/23 0315  Weight: 79.2 kg 77.5 kg 78 kg   Physical Examination: General:  chronically ill appearing on mech vent HEENT: MM pink/moist; ETT in place Neuro: eyes open; cough/gag present; move ext but not on  command CV: s1s2, no m/r/g PULM:  dim clear BS bilaterally; trached on mech vent PRVC GI: soft, bsx4 active  Extremities: warm/dry   Resolved Hospital Problem List:    Assessment & Plan:    Chronic resp failure Trach vent dependence  PsA PNA P: -LTVV strategy with tidal volumes of 6-8 cc/kg ideal body weight -Wean PEEP/FiO2 for SpO2 >92% -VAP bundle in place -trach care per protocol -pulm toiletry: cpt -lavage/tracheal suction as needed  -completed avycaz   on 5/15  Sev sepsis due to CRE klebsiella bacteremia UTI and HCAP (klebsiella and pseudomonas)  -foley exchanged on admission -Repeat Bcx2 07/13/2023: negative -Repeat Bcx 07/18/2023 per ID: negative  -picc placed 5/11 -completed 2wk avycaz   P: -trend wbc/fever curve  Encephalopathy Reported hx dementia Hx SDH c/b bleed and sz  -mini crani/evac at [facility] 10/24 c/b R cerebellar bleed, sz  P: -limit sedating meds -delirium precautions -pt/ot -namenda, provigil   CKD 3a, uremia  Hypernatremia P: -increase free water  -Trend BMP / urinary output -Replace electrolytes as indicated -Avoid nephrotoxic agents, ensure adequate renal perfusion  HTN CAD s/p CABG Systolic HF Hx Afib not on AC  P: -on norvasc  and hydralazine  -given carvedilol  yesterday making patient brady down to 50s which is now being held  AoC anemia, stable  Thrombocytopenia, mild  -chronic, critical illness -abdominal wall hematomas, mild hematuria  P: -will start on dvt ppx today -trend cbc  GERD Constipation P: -Pepcid  -cont bowel regimen  DM2 w hyperglycemia  P: -cont SSI and cbg monitoring -semglee  12 u daily and TF coverage 3u q4  Severe protein calorie malnutrition  P: -EN per RDN   BPH P: -cardura  -cont foley for now  GOC DNR -based on conversation with our team 5/17, continue aggressive care, but DNR  - [facility]   Best Practice (right click and "Reselect all SmartList Selections" daily)  Diet/type: tube feeds DVT prophylaxis lovenox   Pressure ulcer(s): N/A GI prophylaxis: H2B Lines: PICC line; working on getting peripherals and then can remove picc Foley: Yes, and it is still needed Code Status: DNR Last date of multidisciplinary goals of care discussion [5/20 updated wife Isa Manuel over phone]  Signature:   CRITICAL CARE Performed by: Casimiro Cleaves   Total critical care time: 35 minutes  JD Carliss Chess [facility]  [facility] 07/29/2023, 8:25 AM  Please see [email or web address] for pager details.  From 7A-7P if no response, please call [phone]. After hours, please call ELink [phone].

## 2023-07-29 NOTE — Progress Notes (Signed)
 Nutrition Follow-up  DOCUMENTATION CODES:  Non-severe (moderate) malnutrition in context of chronic illness  INTERVENTION:  TF via PEG: Osmolite 1.5 at 52ml/hr ( per day) 60ml ProSource TF20 BID Provides 1780 kcal, 108g protein and free water  daily   Free water  flushes 100ml q1h per MD ( per day) Total free water  (TF+FWF) - 3223 ml per day   Continue Juven BID to support wound healing  NUTRITION DIAGNOSIS:  Moderate Malnutrition related to chronic illness (SDH, chronic respiratory failure) as evidenced by moderate muscle depletion, moderate fat depletion, edema. - diagnosis updated 5/20  GOAL:  Patient will meet greater than or equal to 90% of their needs - goal met via TF  MONITOR:  Vent status, Labs, Weight trends, I & O's, Skin, TF tolerance  REASON FOR ASSESSMENT:  Ventilator, Consult Enteral/tube feeding initiation and management, Wound healing  ASSESSMENT:   Pt admitted from [facility] with abnormal labs (elevated Cr and low hgb). PMH significant for SDH ( s/p mini crani/evac 12/2022 at [facility] c/b cerebellar hemorrhage and seizures), multiple aspiration PNA, chronic respiratory failure requiring trach/PEG, DM2, HTN, CAD, GERD, afib, anemia of chronic illness.  Patient is currently intubated on ventilator support MV: 10.3 L/min Temp (24hrs), Avg:98.7 F (37.1 C), Min:98.2 F (36.8 C), Max:99.3 F (37.4 C)  Abx treatment completed.  Noted plans for discharge to SNF tomorrow.   Continues to tolerate TF well without n/v/d.   Admit weight: 79.4 kg Current weight: 78 kg + mild pitting BUE, BLE  Repeat nutrition focused physical exam performed today. Edema somewhat improved. BUE much improved though still with mild non-pitting to upper extremities however able to assess and deficits noted. BLE still remains too edematous to adequately assess.    Drain/lines:  UOP: x24 hours PICC line PEG   Medications: colace BID, pepcid , SSI 0-15 units  q4h, SSI 3 units q4h, semglee  12 units daily, miralax , senna BID   Labs:  Sodium 152 (free water  flushes increased again today) Chloride 115 BUN 108 Cr 1.41 GFR 51 CBG's 119-211 x24 hours  Nutrition Focused Physical Findings  Flowsheet Row Most Recent Value  Orbital Region Moderate depletion  Upper Arm Region Moderate depletion  Thoracic and Lumbar Region Unable to assess  [edema]  Buccal Region Moderate depletion  Temple Region No depletion  Clavicle Bone Region Mild depletion  Clavicle and Acromion Bone Region Moderate depletion  Scapular Bone Region Moderate depletion  Dorsal Hand Moderate depletion  [LLE>RLE (RLE mild non pitting edema)]  Patellar Region Unable to assess  [remains with deep pitting edema]  Anterior Thigh Region Unable to assess  Posterior Calf Region Unable to assess  Edema (RD Assessment) Severe  [BLE/thoracic]  Hair Reviewed  Eyes Reviewed  Mouth Reviewed  Skin Reviewed  Nails Reviewed   Diet Order:   Diet Order             Diet NPO time specified  Diet effective now                   EDUCATION NEEDS:   Education needs have been addressed  Skin:  Skin Assessment: Skin Integrity Issues: Skin Integrity Issues:: Stage II, Unstageable, Stage III Stage II: bilateral buttocks Stage III: L ankle Unstageable: L heel  Last BM:  5/20 type 6 x2, large  Height:   Ht Readings from Last 1 Encounters:  07/17/23 5\' 6"  (1.676 m)    Weight:   Wt Readings from Last 1 Encounters:  07/29/23 78 kg  Ideal Body Weight:  64.5 kg  BMI:  Body mass index is 27.75 kg/m.  Estimated Nutritional Needs:   Kcal:  1700-1900  Protein:  100-115g  Fluid:  >/=1.7L  Rocklin Chute, RDN, LDN Clinical Nutrition See AMiON for contact information.

## 2023-07-29 NOTE — NC FL2 (Signed)
 [place]  MEDICAID FL2 LEVEL OF CARE FORM     IDENTIFICATION  Patient Name: Albert Vance. Birthdate: 09-12-1945 Sex: male Admission Date (Current Location): 07/10/2023  [facility] and [place] Number:  Producer, television/film/video and Address:  [facility] [place], [address] [place], [place] [address]      Provider Number: [id number]  Attending Physician Name and Address:  Guerry Leek, MD  Relative Name and Phone Number:  Hyrum, Shaneyfelt (Spouse)  [phone] (Mobile)    Current Level of Care:   Recommended Level of Care: Skilled Nursing Facility Prior Approval Number:    Date Approved/Denied:   PASRR Number: [id number]  Discharge Plan: SNF    Current Diagnoses: Patient Active Problem List   Diagnosis Date Noted   Goals of care, counseling/discussion 07/15/2023   Carbapenem-resistant bacterial infection 07/14/2023   Hypoalbuminemia 07/11/2023   Klebsiella sepsis (HCC) 07/11/2023   Hypernatremia 07/10/2023   AKI (acute kidney injury) (HCC) 07/10/2023   Ventilator dependent (HCC) 07/10/2023   Symptomatic anemia 07/10/2023   Anasarca 07/10/2023   Pressure injury of skin of left heel 07/10/2023    Orientation RESPIRATION BLADDER Height & Weight      (Unable to determine; pt is alert, on trach vent)  Vent, Tracheostomy (40% fio2) Indwelling catheter Weight: 171 lb 15.3 oz (78 kg) Height:  5\' 6"  (167.6 cm)  BEHAVIORAL SYMPTOMS/MOOD NEUROLOGICAL BOWEL NUTRITION STATUS      Incontinent Diet (See d/c summary)  AMBULATORY STATUS COMMUNICATION OF NEEDS Skin   Total Care Verbally PU Stage and Appropriate Care (Pressure injury sacrum stage 3; pressure injury left heel unstageable; pressure injury buttocks left and right stage 3; left ankle posterior deep tissue pressure injury)                       Personal Care Assistance Level of Assistance  Bathing, Feeding, Dressing, Total care Bathing Assistance: Maximum assistance Feeding assistance:  Maximum assistance Dressing Assistance: Maximum assistance Total Care Assistance: Maximum assistance   Functional Limitations Info  Sight, Hearing, Speech Sight Info: Impaired Hearing Info: Impaired Speech Info: Impaired    SPECIAL CARE FACTORS FREQUENCY                       Contractures Contractures Info: Not present    Additional Factors Info  Code Status, Allergies Code Status Info: DNR Allergies Info: Gabapentin, lisinopril           Current Medications (07/29/2023):  This is the current hospital active medication list Current Facility-Administered Medications  Medication Dose Route Frequency Provider Last Rate Last Admin   acetaminophen  (TYLENOL ) tablet 650 mg  650 mg Per Tube Q6H PRN Atway, Rayann N, DO   650 mg at 07/20/23 2059   amantadine  (SYMMETREL ) capsule 100 mg  100 mg Per Tube Daily Nooruddin, Saad, MD   100 mg at 07/29/23 8469   amLODipine  (NORVASC ) tablet 10 mg  10 mg Per Tube Daily Nooruddin, Saad, MD   10 mg at 07/29/23 0732   brimonidine  (ALPHAGAN ) 0.2 % ophthalmic solution 1 drop  1 drop Both Eyes Q8H Nooruddin, Saad, MD   1 drop at 07/29/23 0520   Chlorhexidine  Gluconate Cloth 2 % PADS 6 each  6 each Topical Daily Bevelyn Bryant, MD   6 each at 07/29/23 6295   clonazePAM  (KLONOPIN ) tablet 0.5 mg  0.5 mg Per Tube TID PRN Star East, PA-C   0.5 mg at 07/29/23 [phone]  docusate (COLACE) 50 MG/5ML liquid 100 mg  100 mg Per Tube BID Delories Fetter, NP   100 mg at 07/29/23 4696   dorzolamide -timolol  (COSOPT ) 2-0.5 % ophthalmic solution 1 drop  1 drop Both Eyes BID Payne, John D, PA-C   1 drop at 07/29/23 0734   doxazosin  (CARDURA ) tablet 2 mg  2 mg Per Tube Daily Bowser, Darin Edinger, NP   2 mg at 07/29/23 2952   enoxaparin  (LOVENOX ) injection 40 mg  40 mg Subcutaneous Q24H Hunsucker, Archer Kobs, MD   40 mg at 07/29/23 8413   famotidine  (PEPCID ) tablet 20 mg  20 mg Per Tube Daily Arlyne Bering, MD   20 mg at 07/29/23 0732   feeding supplement (OSMOLITE  1.5 CAL) liquid 1,000 mL  1,000 mL Per Tube Continuous Albustami, Omar M, MD 45 mL/hr at 07/29/23 0813 Infusion Verify at 07/29/23 0813   feeding supplement (PROSource TF20) liquid 60 mL  60 mL Per Tube BID Bevelyn Bryant, MD   60 mL at 07/29/23 2440   free water  200 mL  200 mL Per Tube Q4H Hunsucker, Archer Kobs, MD       Gerhardt's butt cream   Topical BID Arlyne Bering, MD   Given at 07/29/23 0734   guaiFENesin -dextromethorphan  (ROBITUSSIN DM) 100-10 MG/5ML syrup 5 mL  5 mL Per Tube Q4H PRN Desai, Nikita S, MD   5 mL at 07/28/23 0751   hydrALAZINE  (APRESOLINE ) injection 10-40 mg  10-40 mg Intravenous Q4H PRN Payne, John D, PA-C       hydrALAZINE  (APRESOLINE ) tablet 10 mg  10 mg Per Tube Q6H Dannie Duval D, PA-C   10 mg at 07/29/23 1027   insulin  aspart (novoLOG ) injection 0-15 Units  0-15 Units Subcutaneous Q4H Deneise Finlay D, NP   3 Units at 07/29/23 0754   insulin  aspart (novoLOG ) injection 3 Units  3 Units Subcutaneous Q4H Payne, John D, PA-C   3 Units at 07/29/23 0755   insulin  glargine-yfgn (SEMGLEE ) injection 12 Units  12 Units Subcutaneous Daily Arlyne Bering, MD   12 Units at 07/29/23 0755   ipratropium-albuterol  (DUONEB) 0.5-2.5 (3) MG/3ML nebulizer solution 3 mL  3 mL Nebulization Q4H PRN Bowser, Grace E, NP   3 mL at 07/25/23 1601   ipratropium-albuterol  (DUONEB) 0.5-2.5 (3) MG/3ML nebulizer solution 3 mL  3 mL Nebulization BID Arlyne Bering, MD   3 mL at 07/29/23 0747   latanoprost  (XALATAN ) 0.005 % ophthalmic solution 1 drop  1 drop Both Eyes QHS Payne, John D, PA-C   1 drop at 07/28/23 2259   modafinil  (PROVIGIL ) tablet 100 mg  100 mg Per Tube Daily Nooruddin, Saad, MD   100 mg at 07/29/23 2536   nutrition supplement (JUVEN) (JUVEN) powder packet 1 packet  1 packet Per Tube BID BM Desai, Nikita S, MD   1 packet at 07/29/23 6440   ondansetron  (ZOFRAN ) injection 4 mg  4 mg Intravenous Q6H PRN Atway, Rayann N, DO   4 mg at 07/17/23 1712   Oral care mouth rinse  15 mL Mouth Rinse  Q2H Lau, Grace, MD   15 mL at 07/29/23 3474   oxyCODONE  (ROXICODONE ) 5 MG/5ML solution 2.5-5 mg  2.5-5 mg Per Tube Q6H PRN Star East, PA-C   5 mg at 07/28/23 2321   polyethylene glycol (MIRALAX  / GLYCOLAX ) packet 17 g  17 g Per Tube BID Delories Fetter, NP   17 g at 07/29/23 0733   polyvinyl alcohol  (  LIQUIFILM TEARS) 1.4 % ophthalmic solution 1 drop  1 drop Both Eyes Q4H Sandie Cross, MD   1 drop at 07/29/23 0734   QUEtiapine  (SEROQUEL ) tablet 25 mg  25 mg Per Tube Q8H PRN Albustami, Omar M, MD   25 mg at 07/28/23 2302   senna (SENOKOT) tablet 17.2 mg  2 tablet Per Tube Daily Bowser, Darin Edinger, NP   17.2 mg at 07/29/23 2956   sodium chloride  flush (NS) 0.9 % injection 10-40 mL  10-40 mL Intracatheter Q12H Arlyne Bering, MD   10 mL at 07/29/23 0735   sodium chloride  flush (NS) 0.9 % injection 10-40 mL  10-40 mL Intracatheter PRN Albustami, Linna Richard, MD         Discharge Medications: Please see discharge summary for a list of discharge medications.  Relevant Imaging Results:  Relevant Lab Results:   Additional Information SSN [id number]  Katrinka Parr, LCSW

## 2023-07-29 NOTE — Progress Notes (Addendum)
 Attempted PIV x 2 unsuccessfully.  IV team consult placed for PIV placement so PICC could be removed today.  Per Maximo Spar., RN with IV team, "We do not place just in case IV's. If MD still wants access, I would suggest leaving in his PICC until his discharge tomorrow."  Dr. Marygrace Snellen and PA notified. PICC remains in-place.

## 2023-07-29 NOTE — TOC Progression Note (Signed)
 Transition of Care (TOC) - Progression Note    Patient Details  Name: Roddrick Sharron. MRN: [id number] Date of Birth: [birth year]  Transition of Care [facility]) CM/SW Contact  Katrinka Parr, [place] Phone Number: 07/29/2023, 10:44 AM  Clinical Narrative:     Received call from [place] at [facility]. She confirmed they can admit pt tomorrow. She is requesting updated fl2 and DC summary be faxed to [phone] tomorrow morning prior to DC.   [facility] transport is scheduled at [facility] for pickup. Nikki provided number for report for RN to call tomorrow: [phone](ask for 500 hall)  Expected Discharge Plan: Skilled Nursing Facility Barriers to Discharge: Vent Bed not available  Expected Discharge Plan and Services     Post Acute Care Choice: Skilled Nursing Facility Living arrangements for the past 2 months:  (admitted from [facility])                                       Social Determinants of Health (SDOH) Interventions SDOH Screenings   Food Insecurity: No Food Insecurity (07/14/2023)  Housing: Low Risk  (07/14/2023)  Transportation Needs: Patient Unable To Answer (07/14/2023)  Utilities: Not At Risk (07/14/2023)  Social Connections: Patient Unable To Answer (07/14/2023)  Tobacco Use: Medium Risk (07/19/2023)    Readmission Risk Interventions     No data to display

## 2023-07-30 DIAGNOSIS — N179 Acute kidney failure, unspecified: Secondary | ICD-10-CM | POA: Diagnosis not present

## 2023-07-30 DIAGNOSIS — E870 Hyperosmolality and hypernatremia: Secondary | ICD-10-CM | POA: Diagnosis not present

## 2023-07-30 LAB — BASIC METABOLIC PANEL WITH GFR
Anion gap: 8 (ref 5–15)
BUN: 113 mg/dL — ABNORMAL HIGH (ref 8–23)
CO2: 29 mmol/L (ref 22–32)
Calcium: 9.2 mg/dL (ref 8.9–10.3)
Chloride: 113 mmol/L — ABNORMAL HIGH (ref 98–111)
Creatinine, Ser: 1.34 mg/dL — ABNORMAL HIGH (ref 0.61–1.24)
GFR, Estimated: 54 mL/min — ABNORMAL LOW (ref >60)
Glucose, Bld: 215 mg/dL — ABNORMAL HIGH (ref 70–99)
Potassium: 4.5 mmol/L (ref 3.5–5.1)
Sodium: 150 mmol/L — ABNORMAL HIGH (ref 135–145)

## 2023-07-30 LAB — GLUCOSE, CAPILLARY
Glucose-Capillary: 194 mg/dL — ABNORMAL HIGH (ref 70–99)
Glucose-Capillary: 159 mg/dL — ABNORMAL HIGH (ref 70–99)

## 2023-07-30 MED ORDER — INSULIN GLARGINE 100 UNIT/ML ~~LOC~~ SOLN: 12.0000 [IU] | Freq: Every day | SUBCUTANEOUS | Status: DC

## 2023-07-30 MED ORDER — ENOXAPARIN SODIUM 40 MG/0.4ML IJ SOSY: 40.0000 mg | PREFILLED_SYRINGE | INTRAMUSCULAR | Status: DC

## 2023-07-30 MED ORDER — SIMETHICONE 80 MG PO CHEW: 80.0000 mg | CHEWABLE_TABLET | Freq: Four times a day (QID) | ORAL | Status: DC | PRN

## 2023-07-30 MED ORDER — OXYCODONE HCL 5 MG PO TABS: 2.5000 mg | ORAL_TABLET | Freq: Four times a day (QID) | ORAL | 0 refills | Status: DC | PRN

## 2023-07-30 MED ORDER — GERHARDT'S BUTT CREAM: 1.0000 | TOPICAL_CREAM | Freq: Two times a day (BID) | CUTANEOUS | Status: DC

## 2023-07-30 MED ORDER — OSMOLITE 1.5 CAL PO LIQD: 1000.0000 mL | ORAL | Status: DC

## 2023-07-30 MED ORDER — POLYVINYL ALCOHOL 1.4 % OP SOLN: 1.0000 [drp] | OPHTHALMIC | Status: DC

## 2023-07-30 MED ORDER — SENNA 8.6 MG PO TABS: 2.0000 | ORAL_TABLET | Freq: Every day | ORAL | Status: DC

## 2023-07-30 MED ORDER — FAMOTIDINE 20 MG PO TABS: 20.0000 mg | ORAL_TABLET | Freq: Every day | ORAL | Status: DC

## 2023-07-30 MED ORDER — GUAIFENESIN-DM 100-10 MG/5ML PO SYRP: 5.0000 mL | ORAL_SOLUTION | ORAL | Status: DC | PRN

## 2023-07-30 MED ORDER — DOCUSATE SODIUM 50 MG/5ML PO LIQD: 100.0000 mg | Freq: Two times a day (BID) | ORAL | Status: DC

## 2023-07-30 MED ORDER — POLYETHYLENE GLYCOL 3350 17 G PO PACK: 17.0000 g | PACK | Freq: Two times a day (BID) | ORAL | Status: DC

## 2023-07-30 MED ORDER — FREE WATER: 100.0000 mL | Status: DC

## 2023-07-30 MED ORDER — PROSOURCE TF20 ENFIT COMPATIBL EN LIQD: 60.0000 mL | Freq: Two times a day (BID) | ENTERAL | Status: DC

## 2023-07-30 MED ORDER — CLONAZEPAM 0.5 MG PO TABS: 0.5000 mg | ORAL_TABLET | Freq: Three times a day (TID) | ORAL | 0 refills | Status: AC | PRN

## 2023-07-30 MED ORDER — JUVEN PO PACK: 1.0000 | PACK | Freq: Two times a day (BID) | ORAL | Status: DC

## 2023-07-30 MED ORDER — ORAL CARE MOUTH RINSE: 15.0000 mL | OROMUCOSAL | Status: DC

## 2023-07-30 MED ORDER — INSULIN ASPART 100 UNIT/ML IJ SOLN: 0.0000 [IU] | INTRAMUSCULAR | Status: DC

## 2023-07-30 MED ORDER — QUETIAPINE FUMARATE 25 MG PO TABS: 25.0000 mg | ORAL_TABLET | Freq: Three times a day (TID) | ORAL | Status: DC | PRN

## 2023-07-30 MED ORDER — INSULIN ASPART 100 UNIT/ML IJ SOLN: 3.0000 [IU] | INTRAMUSCULAR | Status: DC

## 2023-07-30 NOTE — Plan of Care (Signed)
  Problem: Activity: Goal: Ability to tolerate increased activity will improve Outcome: Progressing   Problem: Respiratory: Goal: Ability to maintain a clear airway and adequate ventilation will improve Outcome: Progressing   Problem: Role Relationship: Goal: Method of communication will improve Outcome: Progressing   Problem: Education: Goal: Knowledge of General Education information will improve Description: Including pain rating scale, medication(s)/side effects and non-pharmacologic comfort measures Outcome: Progressing   Problem: Clinical Measurements: Goal: Respiratory complications will improve Outcome: Progressing   Problem: Nutrition: Goal: Adequate nutrition will be maintained Outcome: Progressing

## 2023-07-30 NOTE — TOC Transition Note (Signed)
 Transition of Care [facility]) - Discharge Note   Patient Details  Name: Albert Vance. MRN: [id number] Date of Birth: 11-05-1945  Transition of Care [facility]) CM/SW Contact:  Katrinka Parr, LCSW Phone Number: 07/30/2023, 9:24 AM   Clinical Narrative:      Per MD patient ready for DC to [facility], [place], [place] [address]). RN, patient, patient's family, and facility notified of DC. Discharge Summary and FL2 sent to facility. RN to call report prior to discharge [phone](ask for 500 hall). DC packet on chart. [facility] is scheduled for 11am pickup time.   CSW will sign off for now as social work intervention is no longer needed. Please consult us  again if new needs arise.    Final next level of care: Skilled Nursing Facility Barriers to Discharge: No Barriers Identified   Patient Goals and CMS Choice   CMS Medicare.gov Compare Post Acute Care list provided to:: Other (Comment Required) (spouse) Choice offered to / list presented to : Spouse [place] ownership interest in [facility].provided to:: Spouse    Discharge Placement              Patient chooses bed at:  [facility]) Patient to be transferred to facility by: [facility]   Patient and family notified of of transfer: 07/30/23  Discharge Plan and Services Additional resources added to the After Visit Summary for       Post Acute Care Choice: Skilled Nursing Facility                               Social Drivers of Health (SDOH) Interventions SDOH Screenings   Food Insecurity: No Food Insecurity (07/14/2023)  Housing: Low Risk  (07/14/2023)  Transportation Needs: Patient Unable To Answer (07/14/2023)  Utilities: Not At Risk (07/14/2023)  Social Connections: Patient Unable To Answer (07/14/2023)  Tobacco Use: Medium Risk (07/19/2023)     Readmission Risk Interventions     No data to display

## 2023-09-09 DEATH — deceased
